# Patient Record
Sex: Female | Born: 1990 | Race: White | Hispanic: No | Marital: Single | Smoking: Former smoker
Health system: Southern US, Community
[De-identification: ages and names within clinical notes are randomized; demographics above are authoritative.]

## PROBLEM LIST (undated history)

## (undated) DIAGNOSIS — I10 Essential (primary) hypertension: Secondary | ICD-10-CM

## (undated) DIAGNOSIS — F329 Major depressive disorder, single episode, unspecified: Secondary | ICD-10-CM

## (undated) DIAGNOSIS — F419 Anxiety disorder, unspecified: Secondary | ICD-10-CM

## (undated) DIAGNOSIS — F909 Attention-deficit hyperactivity disorder, unspecified type: Secondary | ICD-10-CM

## (undated) HISTORY — PX: WISDOM TOOTH EXTRACTION: SHX21

## (undated) HISTORY — PX: TONSILLECTOMY: SUR1361

---

## 2008-12-12 ENCOUNTER — Emergency Department: Payer: Self-pay | Admitting: Emergency Medicine

## 2009-06-05 ENCOUNTER — Observation Stay: Payer: Self-pay | Admitting: Psychiatry

## 2012-09-08 ENCOUNTER — Ambulatory Visit: Payer: Self-pay | Admitting: Family Medicine

## 2012-09-13 ENCOUNTER — Emergency Department: Payer: Self-pay | Admitting: Emergency Medicine

## 2014-03-16 ENCOUNTER — Emergency Department: Payer: Self-pay | Admitting: Emergency Medicine

## 2014-03-16 LAB — URINALYSIS, COMPLETE
Bacteria: NONE SEEN
Bilirubin,UR: NEGATIVE
Blood: NEGATIVE
Glucose,UR: NEGATIVE mg/dL (ref 0–75)
KETONE: NEGATIVE
Leukocyte Esterase: NEGATIVE
Nitrite: NEGATIVE
PH: 7 (ref 4.5–8.0)
PROTEIN: NEGATIVE
RBC,UR: <1 /HPF (ref 0–5)
Specific Gravity: 1.011 (ref 1.003–1.030)
Squamous Epithelial: <1
WBC UR: <1 /HPF (ref 0–5)

## 2014-08-23 ENCOUNTER — Ambulatory Visit
Admission: EM | Admit: 2014-08-23 | Discharge: 2014-08-23 | Disposition: A | Discharge status: Home / Self Care | Payer: Self-pay | Attending: Family Medicine | Admitting: Family Medicine

## 2014-08-23 ENCOUNTER — Encounter: Payer: Self-pay | Admitting: Emergency Medicine

## 2014-08-23 DIAGNOSIS — F909 Attention-deficit hyperactivity disorder, unspecified type: Secondary | ICD-10-CM | POA: Insufficient documentation

## 2014-08-23 DIAGNOSIS — F17210 Nicotine dependence, cigarettes, uncomplicated: Secondary | ICD-10-CM | POA: Insufficient documentation

## 2014-08-23 DIAGNOSIS — I10 Essential (primary) hypertension: Secondary | ICD-10-CM | POA: Insufficient documentation

## 2014-08-23 DIAGNOSIS — Z79899 Other long term (current) drug therapy: Secondary | ICD-10-CM | POA: Insufficient documentation

## 2014-08-23 HISTORY — DX: Essential (primary) hypertension: I10

## 2014-08-23 HISTORY — DX: Attention-deficit hyperactivity disorder, unspecified type: F90.9

## 2014-08-23 LAB — BASIC METABOLIC PANEL
Anion gap: 12 (ref 5–15)
BUN: 9 mg/dL (ref 6–20)
CALCIUM: 9.7 mg/dL (ref 8.9–10.3)
CO2: 24 mmol/L (ref 22–32)
CREATININE: 0.77 mg/dL (ref 0.44–1.00)
Chloride: 103 mmol/L (ref 101–111)
GFR calc non Af Amer: >60 mL/min (ref >60)
Glucose, Bld: 89 mg/dL (ref 65–99)
Potassium: 3.8 mmol/L (ref 3.5–5.1)
Sodium: 139 mmol/L (ref 135–145)

## 2014-08-23 LAB — PREGNANCY, URINE: Preg Test, Ur: NEGATIVE

## 2014-08-23 MED ORDER — LISINOPRIL-HYDROCHLOROTHIAZIDE 20-25 MG PO TABS: 1.0000 | ORAL_TABLET | Freq: Every day | ORAL | Status: DC

## 2014-08-23 MED ORDER — AMLODIPINE BESYLATE 10 MG PO TABS: 10.0000 mg | ORAL_TABLET | Freq: Every day | ORAL | Status: DC

## 2014-08-23 NOTE — ED Provider Notes (Signed)
CSN: [id number]     Arrival date & time 08/23/14  9147 History   First MD Initiated Contact with Patient 08/23/14 1035     Chief Complaint  Patient presents with  . Joint Swelling   (Consider location/radiation/quality/duration/timing/severity/associated sxs/prior Treatment) HPI Comments: Caucasian female recently moved and has not found new PCM ran out of zestoretic 20-25mg  po daily four days ago has had bilateral ankle swelling.  Still taking norvasc  po daily.  PCM wouldn't call in refill stated she needed to come in for a visit/labs.  Last labs 8 months ago.  Patient's mother reported grandmother required potassium and her blood pressure pills.  Mother with patient in exam room.  The history is provided by the patient.    Past Medical History  Diagnosis Date  . Hypertension   . ADHD (attention deficit hyperactivity disorder)    Past Surgical History  Procedure Laterality Date  . Tonsillectomy    . Wisdom tooth extraction     History reviewed. No pertinent family history. History  Substance Use Topics  . Smoking status: Current Every Day Smoker  . Smokeless tobacco: Never Used  . Alcohol Use: No   OB History    No data available     Review of Systems  Constitutional: Negative for fever, chills, diaphoresis, activity change, appetite change and fatigue.  HENT: Negative for congestion, dental problem, drooling, ear discharge, ear pain and facial swelling.   Eyes: Negative for photophobia, pain, discharge, redness, itching and visual disturbance.  Respiratory: Negative for cough, choking, chest tightness, shortness of breath, wheezing and stridor.   Cardiovascular: Positive for leg swelling. Negative for chest pain and palpitations.  Gastrointestinal: Negative for nausea, vomiting, abdominal pain, diarrhea, constipation, blood in stool and abdominal distention.  Endocrine: Negative for cold intolerance and heat intolerance.  Genitourinary: Negative for dysuria and  difficulty urinating.  Musculoskeletal: Positive for joint swelling. Negative for myalgias, back pain, arthralgias, gait problem, neck pain and neck stiffness.  Skin: Negative for color change, pallor, rash and wound.  Allergic/Immunologic: Negative for environmental allergies and food allergies.  Neurological: Negative for dizziness, tremors, seizures, syncope, facial asymmetry, speech difficulty, weakness, light-headedness, numbness and headaches.  Hematological: Negative for adenopathy. Does not bruise/bleed easily.  Psychiatric/Behavioral: Negative for behavioral problems, confusion, sleep disturbance and agitation.    Allergies  Review of patient's allergies indicates no known allergies.  Home Medications   Prior to Admission medications   Medication Sig Start Date End Date Taking? Authorizing Provider  amLODipine (NORVASC) 10 MG tablet Take 10 mg by mouth daily.   Yes Historical Provider, MD  amphetamine-dextroamphetamine (ADDERALL) 20 MG tablet Take 20 mg by mouth 3 (three) times daily.   Yes Historical Provider, MD  lisinopril-hydrochlorothiazide (PRINZIDE,ZESTORETIC) 20-25 MG per tablet Take 1 tablet by mouth daily.   Yes Historical Provider, MD  amLODipine (NORVASC) 10 MG tablet Take 1 tablet (10 mg total) by mouth daily. 08/23/14   Barbaraann Barthel, NP  lisinopril-hydrochlorothiazide (ZESTORETIC) 20-25 MG per tablet Take 1 tablet by mouth daily. 08/23/14   Jarold Song Ivar Domangue, NP   BP 137/105 mmHg  Pulse 102  Temp(Src) 98.5 F (36.9 C) (Oral)  Resp 16  Ht  (1.727 m)  Wt 165 lb (74.844 kg)  BMI 25.09 kg/m2  SpO2 100%  LMP 08/02/2014 Physical Exam  Constitutional: She is oriented to person, place, and time. Vital signs are normal. She appears well-developed and well-nourished. No distress.  HENT:  Head: Normocephalic and atraumatic.  Right Ear:  External ear normal.  Left Ear: External ear normal.  Nose: Nose normal.  Mouth/Throat: Oropharynx is clear and moist. No  oropharyngeal exudate.  Eyes: Conjunctivae, EOM and lids are normal. Pupils are equal, round, and reactive to light. Right eye exhibits no discharge. Left eye exhibits no discharge. No scleral icterus.  Neck: Trachea normal and normal range of motion. Neck supple. No JVD present. No tracheal deviation present. No thyromegaly present.  Cardiovascular: Normal rate, regular rhythm, normal heart sounds and intact distal pulses.  Exam reveals no gallop and no friction rub.   No murmur heard. Pulses:      Dorsalis pedis pulses are 2+ on the right side, and 2+ on the left side.  Bilateral lower leg/ankle swelling nonpitting 1+/4  Pulmonary/Chest: Effort normal and breath sounds normal. No stridor. No respiratory distress. She has no wheezes. She has no rales. She exhibits no tenderness.  Abdominal: Soft. Bowel sounds are normal. She exhibits no distension. There is no tenderness.  Musculoskeletal: Normal range of motion. She exhibits edema. She exhibits no tenderness.  Lymphadenopathy:    She has no cervical adenopathy.  Neurological: She is alert and oriented to person, place, and time. Coordination normal.  Skin: Skin is warm, dry and intact. No rash noted. She is not diaphoretic. No erythema. No pallor.  Psychiatric: She has a normal mood and affect. Her speech is normal and behavior is normal. Judgment and thought content normal. Cognition and memory are normal.  Nursing note and vitals reviewed.   ED Course  Procedures (including critical care time) Labs Review Labs Reviewed  BASIC METABOLIC PANEL    Imaging Review No results found. 1200 patient notified via telephone hcg negative and bmp normal potassium/sodium and kidney function.  Patient to start taking medication as prescribed.  Patient verbalized understanding of information/instructions and had no further questions at this time.  MDM   1. Essential hypertension    BMP and HCG today.  Gave 30 day refill of her medications.  Find  new PCM and schedule appt for re-evaluation with taking both of her medications consistently.  Continue current medications as directed.  Continue to monitor blood pressure at home and maintain log of blood pressure and pulse to bring to follow up appointments.  Continue low sodium diet and exercise program.  Recommended weight loss/weight maintenance to BMI 20-25.  Return to the clinic if any new symptoms.  Patient verbalized agreement and understanding of treatment plan and had no further questions at this time.   P2:  Diet and Exercise specific for HTN    Barbaraann Barthel, NP 08/23/14 1202

## 2014-08-23 NOTE — ED Notes (Signed)
Left ankle/foot swelling X 3 days. Reports she has been out of her lisinopril/hctz for 4 days. Has been on this medication for hypertension for 2-3 years.

## 2014-08-23 NOTE — ED Notes (Signed)
Unable to print and hand patient her discharge instructions because her ride is late for a doctors appointment.

## 2014-08-23 NOTE — Discharge Instructions (Signed)
DASH Eating Plan DASH stands for "Dietary Approaches to Stop Hypertension." The DASH eating plan is a healthy eating plan that has been shown to reduce high blood pressure (hypertension). Additional health benefits may include reducing the risk of type 2 diabetes mellitus, heart disease, and stroke. The DASH eating plan may also help with weight loss. WHAT DO I NEED TO KNOW ABOUT THE DASH EATING PLAN? For the DASH eating plan, you will follow these general guidelines:  Choose foods with a percent daily value for sodium of less than 5% (as listed on the food label).  Use salt-free seasonings or herbs instead of table salt or sea salt.  Check with your health care provider or pharmacist before using salt substitutes.  Eat lower-sodium products, often labeled as "lower sodium" or "no salt added."  Eat fresh foods.  Eat more vegetables, fruits, and low-fat dairy products.  Choose whole grains. Look for the word "whole" as the first word in the ingredient list.  Choose fish and skinless chicken or turkey more often than red meat. Limit fish, poultry, and meat to 6 oz (170 g) each day.  Limit sweets, desserts, sugars, and sugary drinks.  Choose heart-healthy fats.  Limit cheese to 1 oz (28 g) per day.  Eat more home-cooked food and less restaurant, buffet, and fast food.  Limit fried foods.  Cook foods using methods other than frying.  Limit canned vegetables. If you do use them, rinse them well to decrease the sodium.  When eating at a restaurant, ask that your food be prepared with less salt, or no salt if possible. WHAT FOODS CAN I EAT? Seek help from a dietitian for individual calorie needs. Grains Whole grain or whole wheat bread. Brown rice. Whole grain or whole wheat pasta. Quinoa, bulgur, and whole grain cereals. Low-sodium cereals. Corn or whole wheat flour tortillas. Whole grain cornbread. Whole grain crackers. Low-sodium crackers. Vegetables Fresh or frozen vegetables  (raw, steamed, roasted, or grilled). Low-sodium or reduced-sodium tomato and vegetable juices. Low-sodium or reduced-sodium tomato sauce and paste. Low-sodium or reduced-sodium canned vegetables.  Fruits All fresh, canned (in natural juice), or frozen fruits. Meat and Other Protein Products Ground beef (85% or leaner), grass-fed beef, or beef trimmed of fat. Skinless chicken or turkey. Ground chicken or turkey. Pork trimmed of fat. All fish and seafood. Eggs. Dried beans, peas, or lentils. Unsalted nuts and seeds. Unsalted canned beans. Dairy Low-fat dairy products, such as skim or 1% milk, 2% or reduced-fat cheeses, low-fat ricotta or cottage cheese, or plain low-fat yogurt. Low-sodium or reduced-sodium cheeses. Fats and Oils Tub margarines without trans fats. Light or reduced-fat mayonnaise and salad dressings (reduced sodium). Avocado. Safflower, olive, or canola oils. Natural peanut or almond butter. Other Unsalted popcorn and pretzels. The items listed above may not be a complete list of recommended foods or beverages. Contact your dietitian for more options. WHAT FOODS ARE NOT RECOMMENDED? Grains White bread. White pasta. White rice. Refined cornbread. Bagels and croissants. Crackers that contain trans fat. Vegetables Creamed or fried vegetables. Vegetables in a cheese sauce. Regular canned vegetables. Regular canned tomato sauce and paste. Regular tomato and vegetable juices. Fruits Dried fruits. Canned fruit in light or heavy syrup. Fruit juice. Meat and Other Protein Products Fatty cuts of meat. Ribs, chicken wings, bacon, sausage, bologna, salami, chitterlings, fatback, hot dogs, bratwurst, and packaged luncheon meats. Salted nuts and seeds. Canned beans with salt. Dairy Whole or 2% milk, cream, half-and-half, and cream cheese. Whole-fat or sweetened yogurt. Full-fat   cheeses or blue cheese. Nondairy creamers and whipped toppings. Processed cheese, cheese spreads, or cheese  curds. Condiments Onion and garlic salt, seasoned salt, table salt, and sea salt. Canned and packaged gravies. Worcestershire sauce. Tartar sauce. Barbecue sauce. Teriyaki sauce. Soy sauce, including reduced sodium. Steak sauce. Fish sauce. Oyster sauce. Cocktail sauce. Horseradish. Ketchup and mustard. Meat flavorings and tenderizers. Bouillon cubes. Hot sauce. Tabasco sauce. Marinades. Taco seasonings. Relishes. Fats and Oils Butter, stick margarine, lard, shortening, ghee, and bacon fat. Coconut, palm kernel, or palm oils. Regular salad dressings. Other Pickles and olives. Salted popcorn and pretzels. The items listed above may not be a complete list of foods and beverages to avoid. Contact your dietitian for more information. WHERE CAN I FIND MORE INFORMATION? National Heart, Lung, and [employer] Institute: www.nhlbi.nih.gov/health/health-topics/topics/dash/ Document Released: 02/13/2011 Document Revised: 07/11/2013 Document Reviewed: 12/29/2012 ExitCare Patient Information 2015 [employer]. This information is not intended to replace advice given to you by your health care provider. Make sure you discuss any questions you have with your health care provider. Hypertension Hypertension, commonly called high blood pressure, is when the force of blood pumping through your arteries is too strong. Your arteries are the blood vessels that carry blood from your heart throughout your body. A blood pressure reading consists of a higher number over a lower number, such as 110/72. The higher number (systolic) is the pressure inside your arteries when your heart pumps. The lower number (diastolic) is the pressure inside your arteries when your heart relaxes. Ideally you want your blood pressure below 120/80. Hypertension forces your heart to work harder to pump blood. Your arteries may become narrow or stiff. Having hypertension puts you at risk for heart disease, stroke, and other problems.  RISK  FACTORS Some risk factors for high blood pressure are controllable. Others are not.  Risk factors you cannot control include:   Race. You may be at higher risk if you are African American.  Age. Risk increases with age.  Gender. Men are at higher risk than women before age 45 years. After age 65, women are at higher risk than men. Risk factors you can control include:  Not getting enough exercise or physical activity.  Being overweight.  Getting too much fat, sugar, calories, or salt in your diet.  Drinking too much alcohol. SIGNS AND SYMPTOMS Hypertension does not usually cause signs or symptoms. Extremely high blood pressure (hypertensive crisis) may cause headache, anxiety, shortness of breath, and nosebleed. DIAGNOSIS  To check if you have hypertension, your health care provider will measure your blood pressure while you are seated, with your arm held at the level of your heart. It should be measured at least twice using the same arm. Certain conditions can cause a difference in blood pressure between your right and left arms. A blood pressure reading that is higher than normal on one occasion does not mean that you need treatment. If one blood pressure reading is high, ask your health care provider about having it checked again. TREATMENT  Treating high blood pressure includes making lifestyle changes and possibly taking medicine. Living a healthy lifestyle can help lower high blood pressure. You may need to change some of your habits. Lifestyle changes may include:  Following the DASH diet. This diet is high in fruits, vegetables, and whole grains. It is low in salt, red meat, and added sugars.  Getting at least 2 hours of brisk physical activity every week.  Losing weight if necessary.  Not smoking.  Limiting   alcoholic beverages.  Learning ways to reduce stress. If lifestyle changes are not enough to get your blood pressure under control, your health care provider may  prescribe medicine. You may need to take more than one. Work closely with your health care provider to understand the risks and benefits. HOME CARE INSTRUCTIONS  Have your blood pressure rechecked as directed by your health care provider.   Take medicines only as directed by your health care provider. Follow the directions carefully. Blood pressure medicines must be taken as prescribed. The medicine does not work as well when you skip doses. Skipping doses also puts you at risk for problems.   Do not smoke.   Monitor your blood pressure at home as directed by your health care provider. SEEK MEDICAL CARE IF:   You think you are having a reaction to medicines taken.  You have recurrent headaches or feel dizzy.  You have swelling in your ankles.  You have trouble with your vision. SEEK IMMEDIATE MEDICAL CARE IF:  You develop a severe headache or confusion.  You have unusual weakness, numbness, or feel faint.  You have severe chest or abdominal pain.  You vomit repeatedly.  You have trouble breathing. MAKE SURE YOU:   Understand these instructions.  Will watch your condition.  Will get help right away if you are not doing well or get worse. Document Released: 02/24/2005 Document Revised: 07/11/2013 Document Reviewed: 12/17/2012 ExitCare Patient Information 2015 [employer]. This information is not intended to replace advice given to you by your health care provider. Make sure you discuss any questions you have with your health care provider.  

## 2015-06-08 ENCOUNTER — Emergency Department
Admission: EM | Admit: 2015-06-08 | Discharge: 2015-06-08 | Disposition: A | Discharge status: Home / Self Care | Payer: Self-pay | Attending: Emergency Medicine | Admitting: Emergency Medicine

## 2015-06-08 ENCOUNTER — Encounter: Payer: Self-pay | Admitting: Emergency Medicine

## 2015-06-08 ENCOUNTER — Emergency Department: Payer: Self-pay

## 2015-06-08 DIAGNOSIS — F17200 Nicotine dependence, unspecified, uncomplicated: Secondary | ICD-10-CM | POA: Insufficient documentation

## 2015-06-08 DIAGNOSIS — Z79899 Other long term (current) drug therapy: Secondary | ICD-10-CM | POA: Insufficient documentation

## 2015-06-08 DIAGNOSIS — J189 Pneumonia, unspecified organism: Secondary | ICD-10-CM | POA: Insufficient documentation

## 2015-06-08 DIAGNOSIS — I10 Essential (primary) hypertension: Secondary | ICD-10-CM | POA: Insufficient documentation

## 2015-06-08 DIAGNOSIS — F909 Attention-deficit hyperactivity disorder, unspecified type: Secondary | ICD-10-CM | POA: Insufficient documentation

## 2015-06-08 LAB — BASIC METABOLIC PANEL
ANION GAP: 9 (ref 5–15)
BUN: 8 mg/dL (ref 6–20)
CALCIUM: 9.2 mg/dL (ref 8.9–10.3)
CO2: 25 mmol/L (ref 22–32)
CREATININE: 0.67 mg/dL (ref 0.44–1.00)
Chloride: 98 mmol/L — ABNORMAL LOW (ref 101–111)
Glucose, Bld: 103 mg/dL — ABNORMAL HIGH (ref 65–99)
Potassium: 3.5 mmol/L (ref 3.5–5.1)
SODIUM: 132 mmol/L — AB (ref 135–145)

## 2015-06-08 LAB — TROPONIN I: Troponin I: <0.03 ng/mL (ref <0.031)

## 2015-06-08 LAB — CBC
HCT: 41.9 % (ref 35.0–47.0)
Hemoglobin: 14.3 g/dL (ref 12.0–16.0)
MCH: 35.2 pg — ABNORMAL HIGH (ref 26.0–34.0)
MCHC: 34.1 g/dL (ref 32.0–36.0)
MCV: 103 fL — AB (ref 80.0–100.0)
PLATELETS: 116 10*3/uL — AB (ref 150–440)
RBC: 4.07 MIL/uL (ref 3.80–5.20)
RDW: 14.1 % (ref 11.5–14.5)
WBC: 13.8 10*3/uL — ABNORMAL HIGH (ref 3.6–11.0)

## 2015-06-08 LAB — RAPID INFLUENZA A&B ANTIGENS
Influenza A (ARMC): NEGATIVE
Influenza B (ARMC): NEGATIVE

## 2015-06-08 MED ORDER — IOPAMIDOL (ISOVUE-370) INJECTION 76%
75.0000 mL | Freq: Once | INTRAVENOUS | Status: AC | PRN
  Administered 2015-06-08: 75 mL via INTRAVENOUS
  Filled 2015-06-08: qty 75

## 2015-06-08 MED ORDER — SODIUM CHLORIDE 0.9 % IV SOLN
1000.0000 mL | Freq: Once | INTRAVENOUS | Status: AC
  Administered 2015-06-08: 1000 mL via INTRAVENOUS

## 2015-06-08 MED ORDER — LEVOFLOXACIN IN D5W 750 MG/150ML IV SOLN
750.0000 mg | Freq: Once | INTRAVENOUS | Status: AC
  Administered 2015-06-08: 750 mg via INTRAVENOUS
  Filled 2015-06-08 (×2): qty 150

## 2015-06-08 MED ORDER — LEVOFLOXACIN 750 MG PO TABS: 750.0000 mg | ORAL_TABLET | Freq: Every day | ORAL | Status: AC

## 2015-06-08 NOTE — Discharge Instructions (Signed)

## 2015-06-08 NOTE — ED Notes (Signed)
Pt c/o chest pain radiating to back for 2 weeks.  States "i think i have a lung infection"  Decreased appetite over last 2 weeks.  Subjective fevers per pt.  Has had diarrhea and nausea.  Yellowish brown productive cough per pt.

## 2015-06-08 NOTE — ED Notes (Signed)
POCT PREG NEGATIVE. °

## 2015-06-08 NOTE — ED Provider Notes (Signed)
[facility] Emergency Department Provider Note  ____________________________________________    I have reviewed the triage vital signs and the nursing notes.   HISTORY  Chief Complaint Chest Pain    HPI Marie Mckenzie is a 25 y.o. female who presents with complaints of chest discomfort in her left posterior chest for approximately one week. 2 weeks ago she developed a cough which has not improved. She does report mild shortness of breath. Subjective fevers. No nasal congestion. No recent travel. No calf pain or swelling. No history of DVTs. No hormones.     Past Medical History  Diagnosis Date  . Hypertension   . ADHD (attention deficit hyperactivity disorder)     There are no active problems to display for this patient.   Past Surgical History  Procedure Laterality Date  . Tonsillectomy    . Wisdom tooth extraction      Current Outpatient Rx  Name  Route  Sig  Dispense  Refill  . amLODipine (NORVASC) 10 MG tablet   Oral   Take 10 mg by mouth daily.         Marland Kitchen. amLODipine (NORVASC) 10 MG tablet   Oral   Take 1 tablet (10 mg total) by mouth daily.   30 tablet   0   . amphetamine-dextroamphetamine (ADDERALL) 20 MG tablet   Oral   Take 20 mg by mouth 3 (three) times daily.         Marland Kitchen. lisinopril-hydrochlorothiazide (PRINZIDE,ZESTORETIC) 20-25 MG per tablet   Oral   Take 1 tablet by mouth daily.         Marland Kitchen. lisinopril-hydrochlorothiazide (ZESTORETIC) 20-25 MG per tablet   Oral   Take 1 tablet by mouth daily.   30 tablet   0     Allergies Review of patient's allergies indicates no known allergies.  History reviewed. No pertinent family history.  Social History Social History  Substance Use Topics  . Smoking status: Current Every Day Smoker  . Smokeless tobacco: Never Used  . Alcohol Use: Yes     Comment: rare    Review of Systems  Constitutional: Negative for fever. Eyes: Negative for redness ENT: Negative for  sore throat Cardiovascular: Chest pain as above Respiratory: Mild shortness of breath, cough Gastrointestinal: Negative for abdominal pain Genitourinary: Negative for dysuria. Musculoskeletal: Negative for calf pain Skin: Negative for rash. Neurological: Negative for focal weakness Psychiatric: no anxiety    ____________________________________________   PHYSICAL EXAM:  VITAL SIGNS: ED Triage Vitals  Enc Vitals Group     BP 06/08/15 1322 153/93 mmHg     Pulse Rate 06/08/15 1322 125     Resp 06/08/15 1322 18     Temp 06/08/15 1322 98.6 F (37 C)     Temp Source 06/08/15 1322 Oral     SpO2 06/08/15 1322 95 %     Weight 06/08/15 1322 169 lb (76.658 kg)     Height 06/08/15 1322 5\' 7"  (1.702 m)     Head Cir --      Peak Flow --      Pain Score 06/08/15 1319 8     Pain Loc --      Pain Edu? --      Excl. in GC? --     Constitutional: Alert and oriented. Well appearing and in no distress.  Eyes: Conjunctivae are normal. No erythema or injection ENT   Head: Normocephalic and atraumatic.   Mouth/Throat: Mucous membranes are moist. Cardiovascular: Significant tachycardia, regular  rhythm. Normal and symmetric distal pulses are present in the upper extremities. No murmurs or rubs  Respiratory: Mild tachypnea. Breath sounds are clear and equal bilaterally.  Gastrointestinal: Soft and non-tender in all quadrants. No distention. There is no CVA tenderness. Genitourinary: deferred Musculoskeletal: Nontender with normal range of motion in all extremities. No lower extremity tenderness nor edema. Neurologic:  Normal speech and language. No gross focal neurologic deficits are appreciated. Skin:  Skin is warm, dry and intact. No rash noted. Psychiatric: Mood and affect are normal. Patient exhibits appropriate insight and judgment.  ____________________________________________    LABS (pertinent positives/negatives)  Labs Reviewed  BASIC METABOLIC PANEL - Abnormal; Notable  for the following:    Sodium 132 (*)    Chloride 98 (*)    Glucose, Bld 103 (*)    All other components within normal limits  CBC - Abnormal; Notable for the following:    WBC 13.8 (*)    MCV 103.0 (*)    MCH 35.2 (*)    Platelets 116 (*)    All other components within normal limits  RAPID INFLUENZA A&B ANTIGENS ([facility] ONLY)  TROPONIN I    ____________________________________________   EKG  ED ECG REPORT I, [name], the attending physician, personally viewed and interpreted this ECG.  Date: 06/08/2015 EKG Time: 1:30 PM Rate: 121 Rhythm: Sinus tachycardia QRS Axis: normal Intervals: normal ST/T Wave abnormalities: normal Conduction Disturbances: none    ____________________________________________    RADIOLOGY  Chest x-ray unremarkable CT angiography shows pneumonia, no PE ____________________________________________   PROCEDURES  Procedure(s) performed: none  Critical Care performed: none  ____________________________________________   INITIAL IMPRESSION / ASSESSMENT AND PLAN / ED COURSE  Pertinent labs & imaging results that were available during my care of the patient were reviewed by me and considered in my medical decision making (see chart for details).  Patient presents with mild dyspnea, cough, left chest pleurisy and tachycardia and a normal chest x-ray. She has no obvious risk factors for PE but given the above we will obtain CT angiography of her chest to rule out PE  CT shows left lower lobe pneumonia. We will give IV Levaquin in the emergency department. I offered admission to the patient that she would like to try by mouth antibiotics at home. Her heart rate has improved after fluids to 111. She knows to return if any worsening symptoms including shortness of breath, worsening cough, fever or chills etc.  ____________________________________________   FINAL CLINICAL IMPRESSION(S) / ED DIAGNOSES  Final diagnoses:  Community  acquired pneumonia          [name], MD 06/08/15 2241

## 2016-02-10 ENCOUNTER — Inpatient Hospital Stay
Admission: AD | Admit: 2016-02-10 | Discharge: 2016-02-14 | DRG: 885 | Disposition: A | Discharge status: Home / Self Care | Payer: No Typology Code available for payment source | Source: Intra-hospital | Attending: Psychiatry | Admitting: Psychiatry

## 2016-02-10 ENCOUNTER — Emergency Department
Admission: EM | Admit: 2016-02-10 | Discharge: 2016-02-10 | Disposition: A | Discharge status: Other Acute Inpatient Hospital | Payer: Self-pay | Attending: Emergency Medicine | Admitting: Emergency Medicine

## 2016-02-10 ENCOUNTER — Encounter: Payer: Self-pay | Admitting: Emergency Medicine

## 2016-02-10 DIAGNOSIS — F1020 Alcohol dependence, uncomplicated: Secondary | ICD-10-CM | POA: Diagnosis present

## 2016-02-10 DIAGNOSIS — R45851 Suicidal ideations: Secondary | ICD-10-CM

## 2016-02-10 DIAGNOSIS — F909 Attention-deficit hyperactivity disorder, unspecified type: Secondary | ICD-10-CM | POA: Diagnosis present

## 2016-02-10 DIAGNOSIS — F17210 Nicotine dependence, cigarettes, uncomplicated: Secondary | ICD-10-CM | POA: Diagnosis present

## 2016-02-10 DIAGNOSIS — Z811 Family history of alcohol abuse and dependence: Secondary | ICD-10-CM | POA: Diagnosis not present

## 2016-02-10 DIAGNOSIS — N390 Urinary tract infection, site not specified: Secondary | ICD-10-CM | POA: Diagnosis present

## 2016-02-10 DIAGNOSIS — F17200 Nicotine dependence, unspecified, uncomplicated: Secondary | ICD-10-CM | POA: Diagnosis present

## 2016-02-10 DIAGNOSIS — F918 Other conduct disorders: Secondary | ICD-10-CM | POA: Insufficient documentation

## 2016-02-10 DIAGNOSIS — Z590 Homelessness: Secondary | ICD-10-CM | POA: Diagnosis not present

## 2016-02-10 DIAGNOSIS — F332 Major depressive disorder, recurrent severe without psychotic features: Secondary | ICD-10-CM | POA: Diagnosis present

## 2016-02-10 DIAGNOSIS — I10 Essential (primary) hypertension: Secondary | ICD-10-CM | POA: Insufficient documentation

## 2016-02-10 DIAGNOSIS — R4689 Other symptoms and signs involving appearance and behavior: Secondary | ICD-10-CM

## 2016-02-10 DIAGNOSIS — M549 Dorsalgia, unspecified: Secondary | ICD-10-CM | POA: Diagnosis present

## 2016-02-10 DIAGNOSIS — Z046 Encounter for general psychiatric examination, requested by authority: Secondary | ICD-10-CM

## 2016-02-10 DIAGNOSIS — Z79899 Other long term (current) drug therapy: Secondary | ICD-10-CM | POA: Diagnosis not present

## 2016-02-10 DIAGNOSIS — F10129 Alcohol abuse with intoxication, unspecified: Secondary | ICD-10-CM | POA: Insufficient documentation

## 2016-02-10 DIAGNOSIS — F39 Unspecified mood [affective] disorder: Secondary | ICD-10-CM | POA: Diagnosis present

## 2016-02-10 DIAGNOSIS — G4700 Insomnia, unspecified: Secondary | ICD-10-CM | POA: Diagnosis present

## 2016-02-10 DIAGNOSIS — F10929 Alcohol use, unspecified with intoxication, unspecified: Secondary | ICD-10-CM | POA: Diagnosis present

## 2016-02-10 DIAGNOSIS — F1010 Alcohol abuse, uncomplicated: Secondary | ICD-10-CM

## 2016-02-10 LAB — URINE DRUG SCREEN, QUALITATIVE (ARMC ONLY)
Amphetamines, Ur Screen: POSITIVE — AB
BARBITURATES, UR SCREEN: NOT DETECTED
Benzodiazepine, Ur Scrn: NOT DETECTED
CANNABINOID 50 NG, UR ~~LOC~~: NOT DETECTED
Cocaine Metabolite,Ur ~~LOC~~: NOT DETECTED
MDMA (Ecstasy)Ur Screen: NOT DETECTED
Methadone Scn, Ur: NOT DETECTED
Opiate, Ur Screen: NOT DETECTED
Phencyclidine (PCP) Ur S: NOT DETECTED
TRICYCLIC, UR SCREEN: NOT DETECTED

## 2016-02-10 LAB — ACETAMINOPHEN LEVEL: Acetaminophen (Tylenol), Serum: <10 ug/mL — ABNORMAL LOW (ref 10–30)

## 2016-02-10 LAB — COMPREHENSIVE METABOLIC PANEL
ALK PHOS: 56 U/L (ref 38–126)
ALT: 65 U/L — ABNORMAL HIGH (ref 14–54)
AST: 132 U/L — AB (ref 15–41)
Albumin: 4.7 g/dL (ref 3.5–5.0)
Anion gap: 9 (ref 5–15)
BILIRUBIN TOTAL: 0.4 mg/dL (ref 0.3–1.2)
BUN: 6 mg/dL (ref 6–20)
CALCIUM: 9.3 mg/dL (ref 8.9–10.3)
CHLORIDE: 107 mmol/L (ref 101–111)
CO2: 26 mmol/L (ref 22–32)
Creatinine, Ser: 0.68 mg/dL (ref 0.44–1.00)
GFR calc non Af Amer: >60 mL/min (ref >60)
Glucose, Bld: 120 mg/dL — ABNORMAL HIGH (ref 65–99)
Potassium: 3.3 mmol/L — ABNORMAL LOW (ref 3.5–5.1)
Sodium: 142 mmol/L (ref 135–145)
TOTAL PROTEIN: 7.9 g/dL (ref 6.5–8.1)

## 2016-02-10 LAB — CBC
HCT: 41.7 % (ref 35.0–47.0)
Hemoglobin: 14.7 g/dL (ref 12.0–16.0)
MCH: 37.8 pg — AB (ref 26.0–34.0)
MCHC: 35.2 g/dL (ref 32.0–36.0)
MCV: 107.4 fL — AB (ref 80.0–100.0)
PLATELETS: 182 10*3/uL (ref 150–440)
RBC: 3.88 MIL/uL (ref 3.80–5.20)
RDW: 13.4 % (ref 11.5–14.5)
WBC: 7 10*3/uL (ref 3.6–11.0)

## 2016-02-10 LAB — POCT PREGNANCY, URINE: PREG TEST UR: NEGATIVE

## 2016-02-10 LAB — SALICYLATE LEVEL

## 2016-02-10 LAB — ETHANOL: ALCOHOL ETHYL (B): 393 mg/dL — AB (ref <5)

## 2016-02-10 MED ORDER — LORAZEPAM 2 MG/ML IJ SOLN
INTRAMUSCULAR | Status: AC
  Administered 2016-02-10: 2 mg via INTRAMUSCULAR
  Filled 2016-02-10: qty 1

## 2016-02-10 MED ORDER — CHLORDIAZEPOXIDE HCL 25 MG PO CAPS
50.0000 mg | ORAL_CAPSULE | Freq: Three times a day (TID) | ORAL | Status: DC
  Administered 2016-02-10 (×2): 50 mg via ORAL
  Filled 2016-02-10 (×2): qty 2

## 2016-02-10 MED ORDER — LORAZEPAM 2 MG/ML IJ SOLN
2.0000 mg | Freq: Once | INTRAMUSCULAR | Status: AC
  Administered 2016-02-10: 2 mg via INTRAMUSCULAR

## 2016-02-10 MED ORDER — DIPHENHYDRAMINE HCL 50 MG/ML IJ SOLN
50.0000 mg | Freq: Once | INTRAMUSCULAR | Status: AC
  Administered 2016-02-10: 50 mg via INTRAMUSCULAR

## 2016-02-10 MED ORDER — NICOTINE 21 MG/24HR TD PT24
21.0000 mg | MEDICATED_PATCH | Freq: Once | TRANSDERMAL | Status: DC
  Administered 2016-02-10: 21 mg via TRANSDERMAL
  Filled 2016-02-10: qty 1

## 2016-02-10 MED ORDER — HALOPERIDOL LACTATE 5 MG/ML IJ SOLN
INTRAMUSCULAR | Status: AC
  Administered 2016-02-10: 5 mg via INTRAMUSCULAR
  Filled 2016-02-10: qty 1

## 2016-02-10 MED ORDER — HALOPERIDOL LACTATE 5 MG/ML IJ SOLN
5.0000 mg | Freq: Once | INTRAMUSCULAR | Status: AC
  Administered 2016-02-10: 5 mg via INTRAMUSCULAR

## 2016-02-10 MED ORDER — DIPHENHYDRAMINE HCL 50 MG/ML IJ SOLN
INTRAMUSCULAR | Status: AC
  Administered 2016-02-10: 50 mg via INTRAMUSCULAR
  Filled 2016-02-10: qty 1

## 2016-02-10 NOTE — ED Provider Notes (Addendum)
[facility] Emergency Department Provider Note  ____________________________________________   First MD Initiated Contact with Patient 02/10/16 0404     (approximate)  I have reviewed the triage vital signs and the nursing notes.   HISTORY  Chief Complaint Suicidal   HPI Marie Mckenzie is a 25 y.o. female with a history of alcoholism who is presenting to the emergency department today intoxicated and wanting to jump in front of a train. She does not give further history and says "go check your records"  when asked about further details.   Past Medical History:  Diagnosis Date  . ADHD (attention deficit hyperactivity disorder)   . Hypertension     There are no active problems to display for this patient.   Past Surgical History:  Procedure Laterality Date  . TONSILLECTOMY    . WISDOM TOOTH EXTRACTION      Prior to Admission medications   Medication Sig Start Date End Date Taking? Authorizing Provider  amLODipine (NORVASC) 10 MG tablet Take 1 tablet (10 mg total) by mouth daily. 08/23/14  Yes Barbaraann Barthelina A Betancourt, NP  amphetamine-dextroamphetamine (ADDERALL) 20 MG tablet Take 20 mg by mouth 3 (three) times daily.   Yes Historical Provider, MD  lisinopril-hydrochlorothiazide (ZESTORETIC) 20-25 MG per tablet Take 1 tablet by mouth daily. 08/23/14  Yes Barbaraann Barthelina A Betancourt, NP    Allergies Patient has no known allergies.  History reviewed. No pertinent family history.  Social History Social History  Substance Use Topics  . Smoking status: Current Every Day Smoker    Types: Cigarettes  . Smokeless tobacco: Never Used  . Alcohol use Yes     Comment: uknown amount of liquor tonight    Review of Systems Level V caveat secondary to patient unwilling to answer questions.  ____________________________________________   PHYSICAL EXAM:  VITAL SIGNS: ED Triage Vitals  Enc Vitals Group     BP 02/10/16 0330 (!) 135/94     Pulse Rate 02/10/16  0330 (!) 112     Resp 02/10/16 0330 18     Temp 02/10/16 0330 98.1 F (36.7 C)     Temp Source 02/10/16 0330 Oral     SpO2 02/10/16 0330 98 %     Weight 02/10/16 0331 165 lb (74.8 kg)     Height 02/10/16 0331 5\' 6"  (1.676 m)     Head Circumference --      Peak Flow --      Pain Score 02/10/16 0331 0     Pain Loc --      Pain Edu? --      Excl. in GC? --     Constitutional: Alert and oriented. Patient is agitated. Yelling. Tearful. Eyes: Conjunctivae are normal. PERRL. EOMI. Head: Atraumatic. Nose: No congestion/rhinnorhea. Mouth/Throat: Mucous membranes are moist.  Neck: No stridor.   Cardiovascular: Normal rate, regular rhythm. Grossly normal heart sounds.   Respiratory: Normal respiratory effort.  No retractions. Lungs CTAB. Gastrointestinal: Soft and nontender. No distention.  Musculoskeletal: No lower extremity tenderness nor edema.  No joint effusions. Neurologic:  Normal speech and language. No gross focal neurologic deficits are appreciated.  Skin:  Skin is warm, dry and intact. No rash noted. Psychiatric: Agitated. Yelling.  ____________________________________________   LABS (all labs ordered are listed, but only abnormal results are displayed)  Labs Reviewed  COMPREHENSIVE METABOLIC PANEL - Abnormal; Notable for the following:       Result Value   Potassium 3.3 (*)    Glucose, Bld 120 (*)  AST 132 (*)    ALT 65 (*)    All other components within normal limits  ETHANOL - Abnormal; Notable for the following:    Alcohol, Ethyl (B) 393 (*)    All other components within normal limits  ACETAMINOPHEN LEVEL - Abnormal; Notable for the following:    Acetaminophen (Tylenol), Serum <10 (*)    All other components within normal limits  CBC - Abnormal; Notable for the following:    MCV 107.4 (*)    MCH 37.8 (*)    All other components within normal limits  URINE DRUG SCREEN, QUALITATIVE ([facility] ONLY) - Abnormal; Notable for the following:    Amphetamines, Ur Screen  POSITIVE (*)    All other components within normal limits  SALICYLATE LEVEL  POC URINE PREG, ED  POCT PREGNANCY, URINE   ____________________________________________  EKG   ____________________________________________  RADIOLOGY   ____________________________________________   PROCEDURES  Procedure(s) performed:   Procedures  Critical Care performed:   ____________________________________________   INITIAL IMPRESSION / ASSESSMENT AND PLAN / ED COURSE  Pertinent labs & imaging results that were available during my care of the patient were reviewed by me and considered in my medical decision making (see chart for details).  Patient, during the interview, became upset and attempted to walk out of the emergency department. She required restraints by security and then medication. She was given Haldol as well as Benadryl and Ativan. I completed involuntary commitment paperwork.  Clinical Course      ____________________________________________   FINAL CLINICAL IMPRESSION(S) / ED DIAGNOSES  Agitation. Alcohol intoxication. Suicidal ideation.    NEW MEDICATIONS STARTED DURING THIS VISIT:  New Prescriptions   No medications on file     Note:  This document was prepared using Dragon voice recognition software and may include unintentional dictation errors.    Myrna Blazeravid Matthew Schaevitz, MD 02/10/16 [phone]  Patient resting comfortably after sedatives. Pending psychiatric evaluation.    Myrna Blazeravid Matthew Schaevitz, MD 02/10/16 [phone]

## 2016-02-10 NOTE — ED Notes (Signed)
Pt in her room resting with eye closed. Even, unlabored respirations noted. No acute distress noted. Safety maintained with every 15 minute checks and security cameras in place. Will continue to monitor.

## 2016-02-10 NOTE — ED Notes (Signed)
Pt became upset during triage process when she said she wanted to go outside to smoke but was told she was unable to do so; pt trying to leave; called charge nurse for emergency commitment papers on patient who keeps saying she is going to leave and "do it"; pt wanting MD to come out to triage to give her a nicotine patch; pt became angry and tearful when explained that was going to have to wait for a little bit; officer with pt talking with pt and encouraging her calm down

## 2016-02-10 NOTE — ED Provider Notes (Signed)
-----------------------------------------   11:11 PM on 02/10/2016 -----------------------------------------  Patient was accepted to the behavioral medicine unit.   Irean HongJade J Sung, MD 02/10/16 [phone]

## 2016-02-10 NOTE — Progress Notes (Signed)
Pt up to use toilet then to nurse's station complaining of tremors. Denies any other withdrawal symptoms. VS stable. No acute distress noted. Pt cooperative. Will inform Md. Safety maintained. Will continue to monitor.

## 2016-02-10 NOTE — ED Notes (Signed)
Patient yelling loudly in the hallway at staff and ODS. Patient yelling that she wants a room and what kind of hospital is this. Patient stating that she is going to leave. Patient getting out of bed trying to leave. Patient informed no rooms available at ths time and that she is unable to leave at this time because she is IVC. Dr Pershing ProudSchaevitz at patient bedside. Patient yelling at MD. New orders received at this time.
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
## 2016-02-10 NOTE — ED Notes (Signed)
Pt awake, [facility] Pt cooperative at this time. Safety maintained. Will continue to monitor.

## 2016-02-10 NOTE — ED Notes (Signed)

## 2016-02-10 NOTE — ED Notes (Signed)
[facility]. Pt ambulatory and cooperative with transfer. Reports she came to the hospital "because I'm suicidal. I'm an alcoholic. I have been for 7 years. I drink every day." Pt does verbally contract for safety at this time, and reports she will inform staff if she thinks to act on her suicidal thoughts in any way. VS stable. Pt oriented to room/unit. Pt made aware that security cameras are in place. Safety maintained with every 15 minute checks and security cameras in place. Will continue to monitor.

## 2016-02-10 NOTE — ED Notes (Signed)
Pt continues to rest in her room. Eye closed. Even, unlabored respirations. No acute distress noted. Safety maintained with every 15 minute checks and security cameras in place. Will continue to monitor.

## 2016-02-10 NOTE — ED Notes (Signed)
Pt resting at this time during breakfast tray delivery, tray placed on back of pt bed

## 2016-02-10 NOTE — Progress Notes (Signed)
Pt resting in her room with even, unlabored respirations. No acute distress noted. Safety maintained with every 15 minute checks and security cameras in place. Will continue to monitor.

## 2016-02-10 NOTE — ED Notes (Signed)
2 yellow toned piercings removed and placed in container with pt's belongings

## 2016-02-10 NOTE — ED Notes (Signed)
Food/fluids provided to pt. Pt currently sitting up in bed, eating her dinner tray. No complaints at this time. Cooperative. Safety maintained with every 15 minute checks and security cameras in place. Will continue to monitor.

## 2016-02-10 NOTE — ED Notes (Signed)
Dr Pershing ProudSchaevitz notified of ethanol of 393.

## 2016-02-10 NOTE — ED Triage Notes (Addendum)
Pt says she has been suicidal for a month; tonight she was going to walk out in front of a train but a friend stopped her; pt says she is homeless and an alcoholic; pt says "I was going to really do it tonight"; pt says she drinks daily and wakes every day shaking

## 2016-02-10 NOTE — BH Assessment (Signed)
02/10/16 0845. Spoke to ED RN regarding TTS ordered on pt.  Pt BAC quite high, pt asleep at this time.  Will attempt TTS when pt wakes up. Daleen SquibbGreg Shanekia Latella, LCSW

## 2016-02-10 NOTE — ED Notes (Signed)
IVC pt, SOC recommends admit

## 2016-02-10 NOTE — ED Notes (Signed)
Food/fluids provided for lunch. Pt sitting up in bed eating lunch tray. [facility]. No acute distress noted. Safety maintained. Will continue to monitor.

## 2016-02-10 NOTE — ED Notes (Signed)
PT awake in room. Librium administered as ordered. PT cooperative with medication administration. No complaints at this time. No acute distress at this time. Pt watching Tv. Safety maintained with every 15 minute checks and security cameras in place. Will continue to monitor.

## 2016-02-11 DIAGNOSIS — F332 Major depressive disorder, recurrent severe without psychotic features: Principal | ICD-10-CM

## 2016-02-11 DIAGNOSIS — F909 Attention-deficit hyperactivity disorder, unspecified type: Secondary | ICD-10-CM | POA: Diagnosis present

## 2016-02-11 MED ORDER — ACETAMINOPHEN 325 MG PO TABS
650.0000 mg | ORAL_TABLET | Freq: Four times a day (QID) | ORAL | Status: DC | PRN
Start: 2016-02-11 — End: 2016-02-14
  Administered 2016-02-11 – 2016-02-13 (×4): 650 mg via ORAL
  Filled 2016-02-11 (×4): qty 2

## 2016-02-11 MED ORDER — LISINOPRIL-HYDROCHLOROTHIAZIDE 20-25 MG PO TABS: 1.0000 | ORAL_TABLET | Freq: Every day | ORAL | Status: DC

## 2016-02-11 MED ORDER — AMLODIPINE BESYLATE 5 MG PO TABS
10.0000 mg | ORAL_TABLET | Freq: Every day | ORAL | Status: DC
  Administered 2016-02-11: 10 mg via ORAL
  Filled 2016-02-11: qty 2

## 2016-02-11 MED ORDER — NICOTINE 21 MG/24HR TD PT24
21.0000 mg | MEDICATED_PATCH | Freq: Every day | TRANSDERMAL | Status: DC
  Administered 2016-02-11 – 2016-02-14 (×5): 21 mg via TRANSDERMAL
  Filled 2016-02-11 (×5): qty 1

## 2016-02-11 MED ORDER — CHLORDIAZEPOXIDE HCL 25 MG PO CAPS
50.0000 mg | ORAL_CAPSULE | Freq: Four times a day (QID) | ORAL | Status: DC
  Administered 2016-02-11 – 2016-02-12 (×5): 50 mg via ORAL
  Filled 2016-02-11 (×5): qty 2

## 2016-02-11 MED ORDER — ALUM & MAG HYDROXIDE-SIMETH 200-200-20 MG/5ML PO SUSP
30.0000 mL | ORAL | Status: DC | PRN
Start: 2016-02-11 — End: 2016-02-14

## 2016-02-11 MED ORDER — TRAZODONE HCL 100 MG PO TABS
100.0000 mg | ORAL_TABLET | Freq: Every day | ORAL | Status: DC
  Administered 2016-02-11 (×2): 100 mg via ORAL
  Filled 2016-02-11 (×2): qty 1

## 2016-02-11 MED ORDER — CHLORDIAZEPOXIDE HCL 25 MG PO CAPS
50.0000 mg | ORAL_CAPSULE | Freq: Three times a day (TID) | ORAL | Status: DC
  Administered 2016-02-11: 50 mg via ORAL
  Filled 2016-02-11: qty 2

## 2016-02-11 MED ORDER — FOSFOMYCIN TROMETHAMINE 3 G PO PACK
3.0000 g | PACK | Freq: Once | ORAL | Status: AC
  Administered 2016-02-11: 3 g via ORAL
  Filled 2016-02-11: qty 3

## 2016-02-11 MED ORDER — HYDROCHLOROTHIAZIDE 25 MG PO TABS
25.0000 mg | ORAL_TABLET | Freq: Every day | ORAL | Status: DC
  Administered 2016-02-11 – 2016-02-14 (×4): 25 mg via ORAL
  Filled 2016-02-11 (×5): qty 1

## 2016-02-11 MED ORDER — LISINOPRIL 10 MG PO TABS
20.0000 mg | ORAL_TABLET | Freq: Every day | ORAL | Status: DC
  Administered 2016-02-11: 20 mg via ORAL
  Filled 2016-02-11: qty 2

## 2016-02-11 MED ORDER — MAGNESIUM HYDROXIDE 400 MG/5ML PO SUSP: 30.0000 mL | Freq: Every day | ORAL | Status: DC | PRN

## 2016-02-11 MED ORDER — AMPHETAMINE-DEXTROAMPHETAMINE 5 MG PO TABS
20.0000 mg | ORAL_TABLET | Freq: Two times a day (BID) | ORAL | Status: DC
  Administered 2016-02-11 – 2016-02-14 (×6): 20 mg via ORAL
  Filled 2016-02-11 (×6): qty 4

## 2016-02-11 MED ORDER — NICOTINE 21 MG/24HR TD PT24
21.0000 mg | MEDICATED_PATCH | Freq: Once | TRANSDERMAL | Status: AC
  Administered 2016-02-11: 21 mg via TRANSDERMAL
  Filled 2016-02-11: qty 1

## 2016-02-11 NOTE — Progress Notes (Signed)
Patient has been in room asleep most of the day, stating she is really tired. Did attend groups and got up for meals. Denies SI/HI/AVH. States she just wants to get better. Medication compliant. Has been veery pleasant. Remains on Q15 minute checks for safety. Will continue to monitor.

## 2016-02-11 NOTE — Progress Notes (Signed)
Pt c/o burning, and pain during urination at a 6/10. Denies having a vaginal discharge. States "I started to feel it earlier today when I was upstairs. I get UTIs all of the time at home but I would just go to the store and get some AZO, and the pain and burning would go away." Pt reports her most recent UTI was "last month." On-call physician paged around 0030. No response. Was paged twice more around 0050 and 0115. No response. This information will be reported to the oncoming shift for follow-up with the physician. No further complaints. Will continue to monitor.

## 2016-02-11 NOTE — Plan of Care (Signed)
Problem: Education: Goal: Ability to make informed decisions regarding treatment will improve Outcome: Progressing Pt will be able to make informed decisions regarding treatment.  Problem: Medication: Goal: Compliance with prescribed medication regimen will improve Outcome: Progressing Pt will remain compliant with medications.  Problem: Activity: Goal: Interest or engagement in activities will improve Outcome: Progressing Pt will attend groups and appropriately interact with peers.

## 2016-02-11 NOTE — [facility] Group Notes (Signed)
[facility] LCSW Group Therapy   02/11/2016 1pm Type of Therapy: Group Therapy   Participation Level: Active   Participation Quality: Attentive, Sharing and Supportive   Affect: Depressed and Flat   Cognitive: Alert and Oriented   Insight: Developing/Improving and Engaged   Engagement in Therapy: Developing/Improving and Engaged   Modes of Intervention: Clarification, Confrontation, Discussion, Education, Exploration,  Limit-setting, Orientation, Problem-solving, Rapport Building, Dance movement psychotherapisteality Testing, Socialization and Support   Summary of Progress/Problems: Pt identified obstacles faced currently and processed barriers involved in overcoming these obstacles. Pt identified steps necessary for overcoming these obstacles and explored motivation (internal and external) for facing these difficulties head on. Pt further identified one area of concern in their lives and chose a goal to focus on for today. [facility] LCSW Group Therapy   02/11/2016 1pm Type of Therapy: Group Therapy   Participation Level: Active   Participation Quality: Attentive, Sharing and Supportive   Affect: Depressed and Flat   Cognitive: Alert and Oriented   Insight: Developing/Improving and Engaged   Engagement in Therapy: Developing/Improving and Engaged   Modes of Intervention: Clarification, Confrontation, Discussion, Education, Exploration,  Limit-setting, Orientation, Problem-solving, Rapport Building, Dance movement psychotherapisteality Testing, Socialization and Support   Summary of Progress/Problems: Pt identified obstacles faced currently and processed barriers involved in overcoming these obstacles. Pt identified steps necessary for overcoming these obstacles and explored motivation (internal and external) for facing these difficulties head on. Pt further identified one area of concern in their lives and chose a goal to focus on for today. Pt shared that the pt has a primary goal of returning to work and focusing on things to keep the pt busy to  assist the pt in the pt's recovery.  Pt shared that an obstacle to the pt's goal is a combination of alcohol and boredom.. Pt shared the pt's motivation for being successful is to assist the pt is staying purposeful and sober from alcohol so the pt will be better able to "get my life back".  Pt shared the pt intends to attend 12-step meeting to assist the pt in the pt's recovery and in the pt's ability to get her life back and that this looks like "my fiance with my cats and with my work".  CSW actively validated the pt's opinion and provided feedback.  Dorothe PeaJonathan F. Jaequan Propes, LCSWA, LCAS      Dorothe PeaJonathan F. Nezzie Manera, LCSWA, LCAS

## 2016-02-11 NOTE — H&P (Signed)
Psychiatric Admission Assessment Adult  Patient Identification: Marie Mckenzie MRN:  [id number] Date of Evaluation:  02/11/2016 Chief Complaint:  Major depression Principal Diagnosis: <principal problem not specified> Diagnosis:   Patient Active Problem List   Diagnosis Date Noted  . Attention deficit hyperactivity disorder (ADHD) [F90.9] 02/11/2016  . Major depressive disorder, recurrent severe without psychotic features ([place]) [F33.2] 02/10/2016  . Suicidal ideation [R45.851] 02/10/2016  . Involuntary commitment [Z04.6] 02/10/2016  . Alcohol use disorder, severe, dependence ([place]) [F10.20] 02/10/2016  . Alcohol intoxication ([place]) [F10.929] 02/10/2016  . Substance induced mood disorder ([place]) [F19.94] 02/10/2016  . Tobacco use disorder [F17.200] 02/10/2016   History of Present Illness:   Identifying data. Ms. Ronnald Mckenzie is a 25 year old female with history of depression ADHD and alcoholism.  Chief complaint. "I want to stop but I was afraid to do it at home."  History of present illness. Information was obtained from the patient and the chart. The patient has had a history of alcoholism for at least 7 years. She was unable to maintain any sobriety during that time. She would sometimes try to slow down and substitute her half a gallon of rum with beer, but was always afraid that she could develop seizures. Her father is an alcoholic and suffered withdrawal seizures that she witnessed. She reports some symptoms of depression and anxiety with poor sleep, decreased appetite, feeling of guilt and hopelessness worthlessness, poor energy and concentration. She denies suicidal thinking but on the day of admission she arrived in the emergency room threatening to jump in front of a train. She in fact reported that she was on her way to the train tracks and the friend stopped her. She denies psychotic symptoms or symptoms suggestive of bipolar mania. She denies other than alcohol substance use.   Past  psychiatric history. She has been diagnosed with ADHD and sees Dr. Kasandra Knudsen at [facility]. He prescribed 20 mg 3 times a day. She also sees at holistic counselor for her alcoholism. She was never hospitalized. She was never treated with antidepressants. There were no suicide attempts. She did not try substance use treatment. She went to 1 AA meeting but had to leave early.  Family psychiatric history. Father with alcoholism. There are other family members with undiagnosed mental illness.  Social history. 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 She reports living with a friend and his father who are like family to her. They pay for her doctor's visits and medications. They also provide transportation. It is unclear who pays for alcohol.  Total Time spent with patient: 1 hour  Is the patient at risk to self? Yes.    Has the patient been a risk to self in the past 6 months? No.  Has the patient been a risk to self within the distant past? No.  Is the patient a risk to others? No.  Has the patient been a risk to others in the past 6 months? No.  Has the patient been a risk to others within the distant past? No.   Prior Inpatient Therapy:   Prior Outpatient Therapy:    Alcohol Screening: 1. How often do you have a drink containing alcohol?: 4 or more times a week 2. How many drinks containing alcohol do you have on a typical day when you are drinking?: 10 or more 3. How often do you have six or more drinks on one occasion?: Daily or  almost daily Preliminary Score: 8 4. How often during the last year have you found that you were not able to stop drinking once you had started?: Monthly 5. How often during the last year have you failed to do what was normally expected from you becasue of drinking?: Monthly 6. How often during the last year have you needed a first drink in the morning to get yourself going after a heavy  drinking session?: Daily or almost daily 7. How often during the last year have you had a feeling of guilt of remorse after drinking?: Weekly 8. How often during the last year have you been unable to remember what happened the night before because you had been drinking?: Monthly 9. Have you or someone else been injured as a result of your drinking?: No 10. Has a relative or friend or a doctor or another health worker been concerned about your drinking or suggested you cut down?: Yes, during the last year Alcohol Use Disorder Identification Test Final Score (AUDIT): 29 Brief Intervention: Yes Substance Abuse History in the last 12 months:  Yes.   Consequences of Substance Abuse: Negative Previous Psychotropic Medications: Yes  Psychological Evaluations: No  Past Medical History:  Past Medical History:  Diagnosis Date  . ADHD (attention deficit hyperactivity disorder)   . Hypertension     Past Surgical History:  Procedure Laterality Date  . TONSILLECTOMY    . WISDOM TOOTH EXTRACTION     Family History: History reviewed. No pertinent family history.  Tobacco Screening:   Social History:  History  Alcohol Use  . 9.6 - 18.0 oz/week  . 16 - 30 Shots of liquor per week    Comment: uknown amount of liquor tonight     History  Drug Use No    Additional Social History:                           Allergies:  No Known Allergies Lab Results:  Results for orders placed or performed during the hospital encounter of 02/10/16 (from the past 48 hour(s))  Comprehensive metabolic panel     Status: Abnormal   Collection Time: 02/10/16  3:37 AM  Result Value Ref Range   Sodium 142 135 - 145 mmol/L   Potassium 3.3 (L) 3.5 - 5.1 mmol/L   Chloride 107 101 - 111 mmol/L   CO2 26 22 - 32 mmol/L   Glucose, Bld 120 (H) 65 - 99 mg/dL   BUN 6 6 - 20 mg/dL   Creatinine, Ser 0.68 0.44 - 1.00 mg/dL   Calcium 9.3 8.9 - 10.3 mg/dL   Total Protein 7.9 6.5 - 8.1 g/dL   Albumin 4.7 3.5 - 5.0  g/dL   AST 132 (H) 15 - 41 U/L   ALT 65 (H) 14 - 54 U/L   Alkaline Phosphatase 56 38 - 126 U/L   Total Bilirubin 0.4 0.3 - 1.2 mg/dL   GFR calc non Af Amer >60 >60 mL/min   GFR calc Af Amer >60 >60 mL/min    Comment: (NOTE) The eGFR has been calculated using the CKD EPI equation. This calculation has not been validated in all clinical situations. eGFR's persistently <60 mL/min signify possible Chronic Kidney Disease.    Anion gap 9 5 - 15  Ethanol     Status: Abnormal   Collection Time: 02/10/16  3:37 AM  Result Value Ref Range   Alcohol, Ethyl (B) 393 (HH) <5 mg/dL  Comment: CRITICAL RESULT CALLED TO, READ BACK BY AND VERIFIED WITH MICHELE MORTON AT 0923 02/10/16.PMH        LOWEST DETECTABLE LIMIT FOR SERUM ALCOHOL IS 5 mg/dL FOR MEDICAL PURPOSES ONLY   Salicylate level     Status: None   Collection Time: 02/10/16  3:37 AM  Result Value Ref Range   Salicylate Lvl <3.0 2.8 - 30.0 mg/dL  Acetaminophen level     Status: Abnormal   Collection Time: 02/10/16  3:37 AM  Result Value Ref Range   Acetaminophen (Tylenol), Serum <10 (L) 10 - 30 ug/mL    Comment:        THERAPEUTIC CONCENTRATIONS VARY SIGNIFICANTLY. A RANGE OF 10-30 ug/mL MAY BE AN EFFECTIVE CONCENTRATION FOR MANY PATIENTS. HOWEVER, SOME ARE BEST TREATED AT CONCENTRATIONS OUTSIDE THIS RANGE. ACETAMINOPHEN CONCENTRATIONS >150 ug/mL AT 4 HOURS AFTER INGESTION AND >50 ug/mL AT 12 HOURS AFTER INGESTION ARE OFTEN ASSOCIATED WITH TOXIC REACTIONS.   cbc     Status: Abnormal   Collection Time: 02/10/16  3:37 AM  Result Value Ref Range   WBC 7.0 3.6 - 11.0 K/uL   RBC 3.88 3.80 - 5.20 MIL/uL   Hemoglobin 14.7 12.0 - 16.0 g/dL   HCT 41.7 35.0 - 47.0 %   MCV 107.4 (H) 80.0 - 100.0 fL   MCH 37.8 (H) 26.0 - 34.0 pg   MCHC 35.2 32.0 - 36.0 g/dL   RDW 13.4 11.5 - 14.5 %   Platelets 182 150 - 440 K/uL  Urine Drug Screen, Qualitative     Status: Abnormal   Collection Time: 02/10/16  3:37 AM  Result Value Ref Range    Tricyclic, Ur Screen NONE DETECTED NONE DETECTED   Amphetamines, Ur Screen POSITIVE (A) NONE DETECTED   MDMA (Ecstasy)Ur Screen NONE DETECTED NONE DETECTED   Cocaine Metabolite,Ur [place] NONE DETECTED NONE DETECTED   Opiate, Ur Screen NONE DETECTED NONE DETECTED   Phencyclidine (PCP) Ur S NONE DETECTED NONE DETECTED   Cannabinoid 50 Ng, Ur [place] NONE DETECTED NONE DETECTED   Barbiturates, Ur Screen NONE DETECTED NONE DETECTED   Benzodiazepine, Ur Scrn NONE DETECTED NONE DETECTED   Methadone Scn, Ur NONE DETECTED NONE DETECTED    Comment: (NOTE) 076  Tricyclics, urine               Cutoff 1000 ng/mL 200  Amphetamines, urine             Cutoff 1000 ng/mL 300  MDMA (Ecstasy), urine           Cutoff 500 ng/mL 400  Cocaine Metabolite, urine       Cutoff 300 ng/mL 500  Opiate, urine                   Cutoff 300 ng/mL 600  Phencyclidine (PCP), urine      Cutoff 25 ng/mL 700  Cannabinoid, urine              Cutoff 50 ng/mL 800  Barbiturates, urine             Cutoff 200 ng/mL 900  Benzodiazepine, urine           Cutoff 200 ng/mL 1000 Methadone, urine                Cutoff 300 ng/mL 1100 1200 The urine drug screen provides only a preliminary, unconfirmed 1300 analytical test result and should not be used for non-medical 1400 purposes. Clinical consideration and professional judgment should 1500 be  applied to any positive drug screen result due to possible 1600 interfering substances. A more specific alternate chemical method 1700 must be used in order to obtain a confirmed analytical result.  1800 Gas chromato graphy / mass spectrometry (GC/MS) is the preferred 1900 confirmatory method.   Pregnancy, urine POC     Status: None   Collection Time: 02/10/16  3:54 AM  Result Value Ref Range   Preg Test, Ur NEGATIVE NEGATIVE    Comment:        THE SENSITIVITY OF THIS METHODOLOGY IS >24 mIU/mL     Blood Alcohol level:  Lab Results  Component Value Date   ETH 393 (HH) 55/97/4163    Metabolic  Disorder Labs:  No results found for: HGBA1C, MPG No results found for: PROLACTIN No results found for: CHOL, TRIG, HDL, CHOLHDL, VLDL, LDLCALC  Current Medications: Current Facility-Administered Medications  Medication Dose Route Frequency Provider Last Rate Last Dose  . acetaminophen (TYLENOL) tablet 650 mg  650 mg Oral Q6H PRN Lorree Millar B Eann Cleland, MD      . alum & mag hydroxide-simeth (MAALOX/MYLANTA) 200-200-20 MG/5ML suspension 30 mL  30 mL Oral Q4H PRN Justene Jensen B Betzy Barbier, MD      . amphetamine-dextroamphetamine (ADDERALL) tablet 20 mg  20 mg Oral BID WC Iziah Cates B Brycin Kille, MD      . chlordiazePOXIDE (LIBRIUM) capsule 50 mg  50 mg Oral QID Liliyana Thobe B Curlee Bogan, MD      . fosfomycin (MONUROL) packet 3 g  3 g Oral Once Lamisha Roussell B Liddie Chichester, MD      . hydrochlorothiazide (HYDRODIURIL) tablet 25 mg  25 mg Oral Daily Marsha Gundlach B Zetta Stoneman, MD   25 mg at 02/11/16 0827  . magnesium hydroxide (MILK OF MAGNESIA) suspension 30 mL  30 mL Oral Daily PRN Dhaval Woo B Haruto Demaria, MD      . nicotine (NICODERM CQ - dosed in mg/24 hours) patch 21 mg  21 mg Transdermal Once Clovis Fredrickson, MD   21 mg at 02/11/16 0044  . traZODone (DESYREL) tablet 100 mg  100 mg Oral QHS Evander Macaraeg B Keiyon Plack, MD   100 mg at 02/11/16 0044   PTA Medications: Prescriptions Prior to Admission  Medication Sig Dispense Refill Last Dose  . amLODipine (NORVASC) 10 MG tablet Take 1 tablet (10 mg total) by mouth daily. 30 tablet 0 02/09/2016 at Unknown time  . amphetamine-dextroamphetamine (ADDERALL) 20 MG tablet Take 20 mg by mouth 3 (three) times daily.   02/09/2016 at Unknown time  . lisinopril-hydrochlorothiazide (ZESTORETIC) 20-25 MG per tablet Take 1 tablet by mouth daily. 30 tablet 0 02/09/2016 at Unknown time    Musculoskeletal: Strength & Muscle Tone: within normal limits Gait & Station: normal Patient leans: N/A  Psychiatric Specialty Exam: I reviewed physical exam performed in the emergency room and agree with  the findings. Physical Exam  Nursing note and vitals reviewed.   Review of Systems  Genitourinary: Positive for dysuria.  Psychiatric/Behavioral: Positive for depression, substance abuse and suicidal ideas. The patient is nervous/anxious and has insomnia.   All other systems reviewed and are negative.   Blood pressure (!) 124/92, pulse (!) 118, temperature 98.2 F (36.8 C), temperature source Oral, resp. rate 18, height 5' 6"  (1.676 m), weight 74.8 kg (165 lb), last menstrual period 01/08/2016, SpO2 100 %.Body mass index is 26.63 kg/m.  See SRA.  Sleep:  Number of Hours: 4    Treatment Plan Summary: Daily contact with patient to assess and evaluate symptoms and progress in treatment and Medication management   Ms. Goetzinger is a 25 year old female with history of ADHD, depression, and alcoholism admitted for suicidal threats while drunk.  1. Suicidal ideation. The patient is able to contract for safety in the hospital.  2. Alcohol detox. She was started on Librium taper.  3. Substance abuse treatment. The patient declines residential treatment but will follow up with SA IOP program at [facility].  4. Mood. The patient is not interested in pharmacotherapy for depression. She will follow up with her regular therapist.  5. Hypertension. She is on hydrochlorothiazide.  6. ADHD. Dr. Kasandra Knudsen describes that 20 mg 3 times daily. We'll continue.  7. Smoking. Nicotine patch is available.  8. Insomnia. Trazodone is available.  9. UTI. We gave a dose of fosfomycin.  10. Disposition. She will be discharged back to home with her friends. She will follow up with Dr. Kasandra Knudsen for medication management, Holistic therapist for psychotherapy, and SA IOP program at [facility] for substance use.   Observation Level/Precautions:  15 minute checks  Laboratory:  CBC Chemistry Profile UDS UA  Psychotherapy:    Medications:    Consultations:     Discharge Concerns:    Estimated LOS:  Other:     Physician Treatment Plan for Primary Diagnosis: <principal problem not specified> Long Term Goal(s): Improvement in symptoms so as ready for discharge  Short Term Goals: Ability to identify changes in lifestyle to reduce recurrence of condition will improve, Ability to verbalize feelings will improve, Ability to disclose and discuss suicidal ideas, Ability to demonstrate self-control will improve, Ability to identify and develop effective coping behaviors will improve and Ability to maintain clinical measurements within normal limits will improve  Physician Treatment Plan for Secondary Diagnosis: Active Problems:   Major depressive disorder, recurrent severe without psychotic features (HCC)   Attention deficit hyperactivity disorder (ADHD)  Long Term Goal(s): Improvement in symptoms so as ready for discharge  Short Term Goals: Ability to identify changes in lifestyle to reduce recurrence of condition will improve, Ability to demonstrate self-control will improve and Ability to identify triggers associated with substance abuse/mental health issues will improve  I certify that inpatient services furnished can reasonably be expected to improve the patient's condition.    Orson Slick, MD 12/4/201710:40 AM
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
## 2016-02-11 NOTE — Progress Notes (Signed)
Was able to speak with on-call physician about patient c/o urinary symptoms of burning, and pain in bladder during urination and at rest, with no vaginal discharge. Pain level was a 6/10. Dr. Jennet MaduroPucilowska called to informed this nurse that she was going to see the pt this morning and treat her.

## 2016-02-11 NOTE — Plan of Care (Signed)
Problem: Safety: Goal: Ability to remain free from injury will improve Outcome: Progressing Patient has been free from injury   

## 2016-02-11 NOTE — Progress Notes (Signed)
Recreation Therapy Notes  At approximately 3:15 pm, LRT attempted assessment. Patient sleeping.  Marie Mckenzie,[name], LRT/CTRS 02/11/2016 3:16 PM

## 2016-02-11 NOTE — Progress Notes (Signed)
Recreation Therapy Notes  Date: 12.04.17 Time: 1:00 pm Location: Craft Room  Group Topic: Wellness  Goal Area(s) Addresses:  Patient will identify at least one item per dimension of health. Patient will examine areas they are deficient in.  Behavioral Response: Attentive, Interactive  Intervention: 6 Dimensions of Health  Activity: Patients were given a definition sheet with the 6 dimensions of health on it. Patients were given a worksheet with each dimension listed and were instructed to write at least one thing they were currently doing in each dimension. LRT encouraged patients to write 2-3 items.  Education: LRT educated patients on ways to improve each dimension.  Education Outcome: Acknowledges education/In group clarification offered   Clinical Observations/Feedback: Patient wrote at least 2 items in each dimension. Patient contributed to group discussion by stating what areas she was giving enough attention to, what areas she was not giving enough attention to, ways she can improve certain dimensions, how this activity relates to her admission, how this activity relates to her d/c, and what would change for her if she started being more aware of her wellness.  Marie Mckenzie,Jaki Steptoe M, LRT/CTRS 02/11/2016 3:41 PM

## 2016-02-11 NOTE — Tx Team (Signed)
Initial Treatment Plan 02/11/2016 2:20 AM Marie Mckenzie ZOX:[id number]    PATIENT STRESSORS: Financial difficulties Loss of father, best friend, and best friend's father Substance abuse   PATIENT STRENGTHS: Ability for insight Average or above average intelligence Capable of independent living Communication skills Motivation for treatment/growth Physical Health Supportive family/friends   PATIENT IDENTIFIED PROBLEMS: "I need help with my drinking. I want to be sent to rehab from here."  "I don't have ant money or transportation, and it sometimes keeps me from getting my meds."                     DISCHARGE CRITERIA:  Ability to meet basic life and health needs Adequate post-discharge living arrangements Improved stabilization in mood, thinking, and/or behavior Motivation to continue treatment in a less acute level of care Need for constant or close observation no longer present Reduction of life-threatening or endangering symptoms to within safe limits Verbal commitment to aftercare and medication compliance Withdrawal symptoms are absent or subacute and managed without 24-hour nursing intervention  PRELIMINARY DISCHARGE PLAN: Attend aftercare/continuing care group Attend 12-step recovery group Outpatient therapy Placement in alternative living arrangements  PATIENT/FAMILY INVOLVEMENT: This treatment plan has been presented to and reviewed with the patient, Marie LeschesDanielle R Mckenzie, and/or family member. The patient and family have been given the opportunity to ask questions and make suggestions.  Marla Roeunisha L Nathanyel Defenbaugh, RN 02/11/2016, 2:20 AM

## 2016-02-11 NOTE — Progress Notes (Signed)
D: Patient appears disheveled and flat. Endorsing tremors and anxiety. Denies SI/HI/AVH. States she's ready to get better and is looking for rehabilitation. Patient has been visible in the milieu. HR increased. BP 108/76, HR 126. Patient states she drinks "half a gallon of white rum a day."  A: Medication given with education. Encouragement provided.  R: Patient was compliant with medication. She has been calm and cooperative. Safety maintained with 15 min checks.

## 2016-02-11 NOTE — [facility] Suicide Risk Assessment (Signed)
[facility]   Nursing information obtained from:  Patient Demographic factors:  Adolescent or young adult, Low socioeconomic status, Unemployed Current Mental Status:  NA Loss Factors:  Financial problems / change in socioeconomic status Historical Factors:  Anniversary of important loss, Impulsivity Risk Reduction Factors:  Positive social support, Positive therapeutic relationship  Total Time spent with patient: 1 hour Principal Problem: <principal problem not specified> Diagnosis:   Patient Active Problem List   Diagnosis Date Noted  . Attention deficit hyperactivity disorder (ADHD) [F90.9] 02/11/2016  . Major depressive disorder, recurrent severe without psychotic features (HCC) [F33.2] 02/10/2016  . Suicidal ideation [R45.851] 02/10/2016  . Involuntary commitment [Z04.6] 02/10/2016  . Alcohol use disorder, severe, dependence (HCC) [F10.20] 02/10/2016  . Alcohol intoxication (HCC) [F10.929] 02/10/2016  . Substance induced mood disorder (HCC) [F19.94] 02/10/2016  . Tobacco use disorder [F17.200] 02/10/2016   Subjective Data: Suicidal ideation.  Continued Clinical Symptoms:  Alcohol Use Disorder Identification Test Final Score (AUDIT): 29 The "Alcohol Use Disorders Identification Test", Guidelines for Use in Primary Care, Second Edition.  World Science writerHealth Organization [facility](WHO). Score between 0-7:  no or low risk or alcohol related problems. Score between 8-15:  moderate risk of alcohol related problems. Score between 16-19:  high risk of alcohol related problems. Score 20 or above:  warrants further diagnostic evaluation for alcohol dependence and treatment.   CLINICAL FACTORS:   Depression:   Comorbid alcohol abuse/dependence Impulsivity Alcohol/Substance Abuse/Dependencies   Musculoskeletal: Strength & Muscle Tone: within normal limits Gait & Station: normal Patient leans: N/A  Psychiatric Specialty Exam: Physical Exam  Nursing note and vitals  reviewed.   Review of Systems  Genitourinary: Positive for dysuria.  Neurological: Positive for tremors.  Psychiatric/Behavioral: Positive for depression, substance abuse and suicidal ideas. The patient is nervous/anxious and has insomnia.   All other systems reviewed and are negative.   Blood pressure (!) 124/92, pulse (!) 118, temperature 98.2 F (36.8 C), temperature source Oral, resp. rate 18, height 5\' 6"  (1.676 m), weight 74.8 kg (165 lb), last menstrual period 01/08/2016, SpO2 100 %.Body mass index is 26.63 kg/m.  General Appearance: Casual  Eye Contact:  Good  Speech:  Clear and Coherent  Volume:  Normal  Mood:  Anxious  Affect:  Appropriate  Thought Process:  Goal Directed and Descriptions of Associations: Intact  Orientation:  Full (Time, Place, and Person)  Thought Content:  WDL  Suicidal Thoughts:  Yes.  with intent/plan  Homicidal Thoughts:  No  Memory:  Immediate;   Fair Recent;   Fair Remote;   Fair  Judgement:  Impaired  Insight:  Shallow  Psychomotor Activity:  Normal  Concentration:  Concentration: Fair and Attention Span: Fair  Recall:  FiservFair  Fund of Knowledge:  Fair  Language:  Fair  Akathisia:  No  Handed:  Right  AIMS (if indicated):     Assets:  Communication Skills Desire for Improvement Financial Resources/Insurance Housing Physical Health Resilience Social Support Transportation  ADL's:  Intact  Cognition:  WNL  Sleep:  Number of Hours: 4      COGNITIVE FEATURES THAT CONTRIBUTE TO RISK:  None    SUICIDE RISK:   Moderate:  Frequent suicidal ideation with limited intensity, and duration, some specificity in terms of plans, no associated intent, good self-control, limited dysphoria/symptomatology, some risk factors present, and identifiable protective factors, including available and accessible social support.   PLAN OF CARE: Hospital admission, medication management, absence abuse counseling, discharge planning.  Marie Mckenzie is  a  25 year old female with history of ADHD, depression, and alcoholism admitted for suicidal threats while drunk.  1. Suicidal ideation. The patient is able to contract for safety in the hospital.  2. Alcohol detox. She was started on Librium taper.  3. Substance abuse treatment. The patient declines residential treatment but [facility]  4. Mood. The patient is not interested in pharmacotherapy for depression. She will follow up with her regular therapist.  5. Hypertension. She is on hydrochlorothiazide.  6. ADHD. Dr. Janeece RiggersSu describes that 20 mg 3 times daily. We'll continue.  7. Smoking. Nicotine patch is available.  8. Insomnia. Trazodone is available.  9. Disposition. She will be discharged back to home with her friends. She will follow up with Dr. Janeece RiggersSu for medication management, Holistic therapist for psychotherapy, and [facility].  I certify that inpatient services furnished can reasonably be expected to improve the patient's condition.  Marie LineaJolanta Crystalyn Delia, MD 02/11/2016, 10:35 AM

## 2016-02-12 MED ORDER — TRAZODONE HCL 100 MG PO TABS
200.0000 mg | ORAL_TABLET | Freq: Every day | ORAL | Status: DC
  Administered 2016-02-12: 200 mg via ORAL
  Filled 2016-02-12: qty 2

## 2016-02-12 MED ORDER — TEMAZEPAM 15 MG PO CAPS
15.0000 mg | ORAL_CAPSULE | Freq: Once | ORAL | Status: AC
  Administered 2016-02-12: 15 mg via ORAL
  Filled 2016-02-12: qty 1

## 2016-02-12 MED ORDER — CHLORDIAZEPOXIDE HCL 25 MG PO CAPS
25.0000 mg | ORAL_CAPSULE | Freq: Four times a day (QID) | ORAL | Status: AC
  Administered 2016-02-12 – 2016-02-14 (×7): 25 mg via ORAL
  Filled 2016-02-12 (×7): qty 1

## 2016-02-12 NOTE — Plan of Care (Signed)
Problem: Pain Managment: Goal: General experience of comfort will improve Outcome: Progressing Patient is able to verbalize when she is in discomfort

## 2016-02-12 NOTE — BHH Counselor (Signed)
Adult Comprehensive Assessment  Patient ID: Marie Mckenzie, female   DOB: 02/18/1991, 25 y.o.   MRN: [id number]  Information Source: Information source: Patient  Current Stressors:  Employment / Job issues: unemployed Family Relationships: limited support Surveyor, quantityinancial / Lack of resources (include bankruptcy): no income Housing / Lack of housing: living with a friend and his father Physical health (include injuries & life threatening diseases): good physical health Social relationships: limited supports Substance abuse: alcohol use and potentially over-using xanax Bereavement / Loss: lost her father 2 years ago and a friend died "from  alcohol" more recently.   Living/Environment/Situation:  Living Arrangements: Non-relatives/Friends How long has patient lived in current situation?: 3 months What is atmosphere in current home: Comfortable, Supportive  Family History:  Marital status: Single What is your sexual orientation?: heterosexual Has your sexual activity been affected by drugs, alcohol, medication, or emotional stress?: none Does patient have children?: No  Childhood History:  By whom was/is the patient raised?: Both parents Description of patient's relationship with caregiver when they were a child: verbalizes in a vague way that her family is not supportive and hasn't been in a long time Did patient suffer any verbal/emotional/physical/sexual abuse as a child?: No Did patient suffer from severe childhood neglect?: No Has patient ever been sexually abused/assaulted/raped as an adolescent or adult?: No Was the patient ever a victim of a crime or a disaster?: No Witnessed domestic violence?: No Has patient been effected by domestic violence as an adult?: No  Education:  Highest grade of school patient has completed: Primary school teachergraphic design major Currently a student?: No  Employment/Work Situation:   Employment situation: Unemployed Patient's job has been impacted by current  illness: Yes Describe how patient's job has been impacted: drinking keeps her from holding a job and keeping one Has patient ever been in the [employer] and Companymilitary?: No Has patient ever served in combat?: No Did You Receive Any Psychiatric Treatment/Services While in Equities traderthe Military?: No Are There Guns or Other Weapons in Your Home?: No Are These Weapons Safely Secured?: No  Financial Resources:   Financial resources: No income  Alcohol/Substance Abuse:   If attempted suicide, did drugs/alcohol play a role in this?: No Alcohol/Substance Abuse Treatment Hx: Denies past history Has alcohol/substance abuse ever caused legal problems?: Yes  Social Support System:   Patient's Community Support System: Poor  Leisure/Recreation:   Leisure and Hobbies: cooking, video games  Strengths/Needs:      Discharge Plan:   Does patient have access to transportation?: Yes Will patient be returning to same living situation after discharge?: Yes Currently receiving community mental health services: No If no, would patient like referral for services when discharged?: Yes (What [place]?) ([facility]) Does patient have financial barriers related to discharge medications?: Yes ([facility])  Summary/Recommendations:    Pt is a 25 yo female who comes to ER complaining of suicidal ideation and stating her main goal is to stop drinking.  She describes that she has been drinking for 7 years with little to no time in between where she has stopped or been able to slow down.  While on the Behavioral Medicine Unit she will have the opportunity to participate in groups and therapeutic milieu. She will have medications managed and will detox appropriately.  Residential treatment is recommended, however she verbalizes a willingness to participate in outpatient substance abuse treatment and [facility].    Glennon MacSara P Donyelle Enyeart, MSW, LCSW. 02/12/2016

## 2016-02-12 NOTE — Tx Team (Signed)
Interdisciplinary Treatment and Diagnostic Plan Update  02/12/2016 Time of Session: 10:30am Marie LeschesDanielle R Mckenzie MRN: [id number]  Principal Diagnosis: Major depressive disorder, recurrent severe without psychotic features [facility](HCC)  Secondary Diagnoses: Principal Problem:   Major depressive disorder, recurrent severe without psychotic features (HCC) Active Problems:   Attention deficit hyperactivity disorder (ADHD)   Current Medications:  Current Facility-Administered Medications  Medication Dose Route Frequency Provider Last Rate Last Dose  . acetaminophen (TYLENOL) tablet 650 mg  650 mg Oral Q6H PRN Shari ProwsJolanta B Pucilowska, MD   650 mg at 02/12/16 1348  . alum & mag hydroxide-simeth (MAALOX/MYLANTA) 200-200-20 MG/5ML suspension 30 mL  30 mL Oral Q4H PRN Jolanta B Pucilowska, MD      . amphetamine-dextroamphetamine (ADDERALL) tablet 20 mg  20 mg Oral BID WC Jolanta B Pucilowska, MD   20 mg at 02/12/16 1603  . chlordiazePOXIDE (LIBRIUM) capsule 25 mg  25 mg Oral QID Jolanta B Pucilowska, MD      . hydrochlorothiazide (HYDRODIURIL) tablet 25 mg  25 mg Oral Daily Jolanta B Pucilowska, MD   25 mg at 02/12/16 0759  . magnesium hydroxide (MILK OF MAGNESIA) suspension 30 mL  30 mL Oral Daily PRN Jolanta B Pucilowska, MD      . nicotine (NICODERM CQ - dosed in mg/24 hours) patch 21 mg  21 mg Transdermal Daily Jolanta B Pucilowska, MD   21 mg at 02/12/16 0803  . traZODone (DESYREL) tablet 100 mg  100 mg Oral QHS Shari ProwsJolanta B Pucilowska, MD   100 mg at 02/11/16 2102   PTA Medications: Prescriptions Prior to Admission  Medication Sig Dispense Refill Last Dose  . amLODipine (NORVASC) 10 MG tablet Take 1 tablet (10 mg total) by mouth daily. 30 tablet 0 02/09/2016 at Unknown time  . amphetamine-dextroamphetamine (ADDERALL) 20 MG tablet Take 20 mg by mouth 3 (three) times daily.   02/09/2016 at Unknown time  . lisinopril-hydrochlorothiazide (ZESTORETIC) 20-25 MG per tablet Take 1 tablet by mouth daily. 30 tablet 0  02/09/2016 at Unknown time    Patient Stressors: Financial difficulties Loss of father, best friend, and best friend's father Substance abuse  Patient Strengths: Ability for insight Average or above average intelligence Capable of independent living Barrister's clerkCommunication skills Motivation for treatment/growth Physical Health Supportive family/friends  Treatment Modalities: Medication Management, Group therapy, Case management,  1 to 1 session with clinician, Psychoeducation, Recreational therapy.   Physician Treatment Plan for Primary Diagnosis: Major depressive disorder, recurrent severe without psychotic features (HCC) Long Term Goal(s): Improvement in symptoms so as ready for discharge Improvement in symptoms so as ready for discharge   Short Term Goals: Ability to identify changes in lifestyle to reduce recurrence of condition will improve Ability to verbalize feelings will improve Ability to disclose and discuss suicidal ideas Ability to demonstrate self-control will improve Ability to identify and develop effective coping behaviors will improve Ability to maintain clinical measurements within normal limits will improve Ability to identify changes in lifestyle to reduce recurrence of condition will improve Ability to demonstrate self-control will improve Ability to identify triggers associated with substance abuse/mental health issues will improve  Medication Management: Evaluate patient's response, side effects, and tolerance of medication regimen.  Therapeutic Interventions: 1 to 1 sessions, Unit Group sessions and Medication administration.  Evaluation of Outcomes: Progressing  Physician Treatment Plan for Secondary Diagnosis: Principal Problem:   Major depressive disorder, recurrent severe without psychotic features (HCC) Active Problems:   Attention deficit hyperactivity disorder (ADHD)  Long Term Goal(s): Improvement in symptoms so  as ready for discharge Improvement in  symptoms so as ready for discharge   Short Term Goals: Ability to identify changes in lifestyle to reduce recurrence of condition will improve Ability to verbalize feelings will improve Ability to disclose and discuss suicidal ideas Ability to demonstrate self-control will improve Ability to identify and develop effective coping behaviors will improve Ability to maintain clinical measurements within normal limits will improve Ability to identify changes in lifestyle to reduce recurrence of condition will improve Ability to demonstrate self-control will improve Ability to identify triggers associated with substance abuse/mental health issues will improve     Medication Management: Evaluate patient's response, side effects, and tolerance of medication regimen.  Therapeutic Interventions: 1 to 1 sessions, Unit Group sessions and Medication administration.  Evaluation of Outcomes: Progressing   RN Treatment Plan for Primary Diagnosis: Major depressive disorder, recurrent severe without psychotic features (HCC) Long Term Goal(s): Knowledge of disease and therapeutic regimen to maintain health will improve  Short Term Goals: Ability to remain free from injury will improve, Ability to verbalize frustration and anger appropriately will improve, Ability to participate in decision making will improve, Ability to identify and develop effective coping behaviors will improve and Compliance with prescribed medications will improve  Medication Management: RN will administer medications as ordered by provider, will assess and evaluate patient's response and provide education to patient for prescribed medication. RN will report any adverse and/or side effects to prescribing provider.  Therapeutic Interventions: 1 on 1 counseling sessions, Psychoeducation, Medication administration, Evaluate responses to treatment, Monitor vital signs and CBGs as ordered, Perform/monitor CIWA, COWS, AIMS and Fall Risk  screenings as ordered, Perform wound care treatments as ordered.  Evaluation of Outcomes: Progressing   LCSW Treatment Plan for Primary Diagnosis: Major depressive disorder, recurrent severe without psychotic features (HCC) Long Term Goal(s): Safe transition to appropriate next level of care at discharge, Engage patient in therapeutic group addressing interpersonal concerns.  Short Term Goals: Engage patient in aftercare planning with referrals and resources, Increase social support, Increase ability to appropriately verbalize feelings, Increase emotional regulation, Facilitate acceptance of mental health diagnosis and concerns, Facilitate patient progression through stages of change regarding substance use diagnoses and concerns, Identify triggers associated with mental health/substance abuse issues and Increase skills for wellness and recovery  Therapeutic Interventions: Assess for all discharge needs, 1 to 1 time with Social worker, Explore available resources and support systems, Assess for adequacy in community support network, Educate family and significant other(s) on suicide prevention, Complete Psychosocial Assessment, Interpersonal group therapy.  Evaluation of Outcomes: Progressing   Progress in Treatment: Attending groups: Yes. Participating in groups: Yes. Taking medication as prescribed: Yes. Toleration medication: Yes. Family/Significant other contact made: No, will contact:  friend phillip Patient understands diagnosis: No. Discussing patient identified problems/goals with staff: Yes. Medical problems stabilized or resolved: Yes. Denies suicidal/homicidal ideation: Yes. Issues/concerns per patient self-inventory: No. Other:    New problem(s) identified: No, Describe:     New Short Term/Long Term Goal(s):  Discharge Plan or Barriers:   Reason for Continuation of Hospitalization: Medication stabilization Withdrawal symptoms  Estimated Length of  Stay:  Attendees: Patient:Marie Mckenzie 02/12/2016 4:05 PM  Physician: Kristine Linea, MD 02/12/2016 4:05 PM  Nursing: Hulan Amato, RN 02/12/2016 4:05 PM  RN Care Manager: 02/12/2016 4:05 PM  Social Worker: Jake Shark, MSW, LCSW 02/12/2016 4:05 PM  Recreational Therapist: Hershal Coria, LRT 02/12/2016 4:05 PM  Other:  02/12/2016 4:05 PM  Other:  02/12/2016 4:05 PM  Other: 02/12/2016 4:05 PM  Scribe for Treatment Team: Glennon MacSara P Albina Gosney, LCSW 02/12/2016 4:05 PM

## 2016-02-12 NOTE — Progress Notes (Signed)
[facility] MD Progress Note  02/12/2016 12:29 PM Marie Mckenzie  MRN:  [id number]  Subjective:  Marie Mckenzie met with treatment team today. She reports that she feels much better and independently to go home today. Her vital signs are elevated and she is slightly shaky in spite of high-dose of Librium. She denies any somatic complaints except for tremor and tachycardia. She seems to tolerate the detox well but was unable to have a detailed discussion about discharge planning with treatment team. She slept only 2 hours last night. She started participating in groups. Her hygiene today is much better. We'll continue Librium taper.  Principal Problem: Major depressive disorder, recurrent severe without psychotic features (Marie Mckenzie) Diagnosis:   Patient Active Problem List   Diagnosis Date Noted  . Attention deficit hyperactivity disorder (ADHD) [F90.9] 02/11/2016  . Major depressive disorder, recurrent severe without psychotic features (Marie Mckenzie) [F33.2] 02/10/2016  . Suicidal ideation [R45.851] 02/10/2016  . Involuntary commitment [Z04.6] 02/10/2016  . Alcohol use disorder, severe, dependence (Marie Mckenzie) [F10.20] 02/10/2016  . Alcohol intoxication (Wink) [F10.929] 02/10/2016  . Substance induced mood disorder (Marie Mckenzie) [F19.94] 02/10/2016  . Tobacco use disorder [F17.200] 02/10/2016   Total Time spent with patient: 20 minutes  Past Psychiatric History: Alcoholism.  Past Medical History:  Past Medical History:  Diagnosis Date  . ADHD (attention deficit hyperactivity disorder)   . Hypertension     Past Surgical History:  Procedure Laterality Date  . TONSILLECTOMY    . WISDOM TOOTH EXTRACTION     Family History: History reviewed. No pertinent family history. Family Psychiatric  History: Alcoholism. Social History:  History  Alcohol Use  . 9.6 - 18.0 oz/week  . 16 - 46 Shots of liquor per week    Comment: uknown amount of liquor tonight     History  Drug Use No    Social History   Social History  .  Marital status: Single    Spouse name: N/A  . Number of children: N/A  . Years of education: N/A   Social History Main Topics  . Smoking status: Current Every Day Smoker    Packs/day: 1.00    Types: Cigarettes  . Smokeless tobacco: Never Used     Comment: Pt has order for a Nicotine Patch  . Alcohol use 9.6 - 18.0 oz/week    16 - 30 Shots of liquor per week     Comment: uknown amount of liquor tonight  . Drug use: No  . Sexual activity: Yes    Birth control/ protection: Condom   Other Topics Concern  . None   Social History Narrative  . None   Additional Social History:                         Sleep: Poor  Appetite:  Fair  Current Medications: Current Facility-Administered Medications  Medication Dose Route Frequency Provider Last Rate Last Dose  . acetaminophen (TYLENOL) tablet 650 mg  650 mg Oral Q6H PRN Marie Fredrickson, MD   650 mg at 02/12/16 0030  . alum & mag hydroxide-simeth (MAALOX/MYLANTA) 200-200-20 MG/5ML suspension 30 mL  30 mL Oral Q4H PRN Marie Gettinger B Maelie Chriswell, MD      . amphetamine-dextroamphetamine (ADDERALL) tablet 20 mg  20 mg Oral BID WC Marie Milford B Marios Gaiser, MD   20 mg at 02/12/16 0759  . chlordiazePOXIDE (LIBRIUM) capsule 25 mg  25 mg Oral QID Marie Mcelmurry B Gilbert Narain, MD      . hydrochlorothiazide (HYDRODIURIL) tablet  25 mg  25 mg Oral Daily Marie Fredrickson, MD   25 mg at 02/12/16 0759  . magnesium hydroxide (MILK OF MAGNESIA) suspension 30 mL  30 mL Oral Daily PRN Marie Ignasiak B Yexalen Deike, MD      . nicotine (NICODERM CQ - dosed in mg/24 hours) patch 21 mg  21 mg Transdermal Daily Marie Timothy B Tremayne Sheldon, MD   21 mg at 02/12/16 0803  . traZODone (DESYREL) tablet 100 mg  100 mg Oral QHS Marie Fredrickson, MD   100 mg at 02/11/16 2102    Lab Results: No results found for this or any previous visit (from the past 48 hour(s)).  Blood Alcohol level:  Lab Results  Component Value Date   ETH 393 (HH) 15/40/0867    Metabolic Disorder  Labs: No results found for: HGBA1C, MPG No results found for: PROLACTIN No results found for: CHOL, TRIG, HDL, CHOLHDL, VLDL, LDLCALC  Physical Findings: AIMS:  , ,  ,  ,    CIWA:  CIWA-Ar Total: 4 COWS:     Musculoskeletal: Strength & Muscle Tone: within normal limits Gait & Station: normal Patient leans: N/A  Psychiatric Specialty Exam: Physical Exam  Nursing note and vitals reviewed.   Review of Systems  Cardiovascular: Positive for palpitations.  Neurological: Positive for tremors.  Psychiatric/Behavioral: Positive for substance abuse. The patient is nervous/anxious and has insomnia.   All other systems reviewed and are negative.   Blood pressure (!) 119/91, pulse (!) 109, temperature 98.8 F (37.1 C), temperature source Oral, resp. rate 20, height 5' 6"  (1.676 m), weight 74.8 kg (165 lb), last menstrual period 01/08/2016, SpO2 100 %.Body mass index is 26.63 kg/m.  General Appearance: Casual  Eye Contact:  Good  Speech:  Clear and Coherent  Volume:  Decreased  Mood:  Anxious  Affect:  Blunt  Thought Process:  Goal Directed and Descriptions of Associations: Tangential  Orientation:  Full (Time, Place, and Person)  Thought Content:  WDL  Suicidal Thoughts:  No  Homicidal Thoughts:  No  Memory:  Immediate;   Fair Recent;   Fair Remote;   Fair  Judgement:  Impaired  Insight:  Lacking  Psychomotor Activity:  Psychomotor Retardation  Concentration:  Concentration: Fair and Attention Span: Fair  Recall:  [employer] of Knowledge:  Fair  Language:  Fair  Akathisia:  No  Handed:  Right  AIMS (if indicated):     Assets:  Communication Skills Desire for Improvement Financial Resources/Insurance Housing Physical Health Resilience Social Support Vocational/Educational  ADL's:  Intact  Cognition:  WNL  Sleep:  Number of Hours: 2.25     Treatment Plan Summary: Daily contact with patient to assess and evaluate symptoms and progress in treatment and Medication  management   Marie Mckenzie is a 25 year old female with history of ADHD, depression, and alcoholism admitted for suicidal threats while drunk.  1. Suicidal ideation. The patient is able to contract for safety in the hospital.  2. Alcohol detox. She was started on Librium taper. I will lower Librium to 25 mg qid.  3. Substance abuse treatment. The patient declines residential treatment but will follow up with SA IOP program at [facility].  4. Mood. The patient is not interested in pharmacotherapy for depression. She will follow up with her regular therapist.  5. Hypertension. She is on hydrochlorothiazide.  6. ADHD. Dr. Kasandra Knudsen describes that 20 mg 3 times daily. We'll continue.  7. Smoking. Nicotine patch is available.  8. Insomnia. Trazodone is available.  9. UTI. We gave a dose of fosfomycin.  10. Disposition. She will be discharged back to home with her friends. She will follow up with Dr. Kasandra Knudsen for medication management, Holistic therapist for psychotherapy, and SA IOP program at [facility] for substance use.  Orson Slick, MD 02/12/2016, 12:29 PM

## 2016-02-12 NOTE — [facility] Group Notes (Signed)
[facility] LCSW Group Therapy   02/12/2016 9:30 am   Type of Therapy: Group Therapy   Participation Level: Pt invited but did not attend.    Hampton AbbotKadijah Malaquias Lenker, MSW, LCSWA 02/12/2016, 10:26AM

## 2016-02-12 NOTE — Progress Notes (Signed)
Recreation Therapy Notes  Date: 12.05.17 Time: 2:00 pm Location: Craft Room  Group Topic: Self-expression  Goal Area(s) Addresses:  Patient will be able to identify a color the represents each emotion. Patient will verbalize benefit of using art as a means of self-expression. Patient will verbalize one emotion experienced while participating in group.  Behavioral Response: Attentive, Interactive  Intervention: The Colors Within Me  Activity: Patients were given blank face worksheets and were instructed to pick a color for each emotion they were feeling and to show how much of that emotion they were feeling on the worksheet.  Education: LRT educated patients on other forms of self-expression.  Education Outcome: Acknowledges education/In group clarification offered.   Clinical Observations/Feedback: Patient picked colors for the emotions she was feeling. Patient contributed to group discussion by stating what she was feeling, how her emotions affect her treatment in the hospital, that her emotions are dynamic, and what steps she can take to change her emotions to more positive ones.  Jacquelynn CreeGreene,Amol Domanski M, LRT/CTRS 02/12/2016 4:10 PM

## 2016-02-12 NOTE — Progress Notes (Signed)
Recreation Therapy Notes  INPATIENT RECREATION THERAPY ASSESSMENT  Patient Details Name: Marie LeschesDanielle R Mcveigh MRN: [id number] DOB: 04/08/1990 Today's Date: 02/12/2016  Patient Stressors: Family, Death, Work, Other (Comment) (Not good relationship with mother and brother - they are drug users and want her medication; best friend died in the summer; had a job lined up, but it fel;l through; alcoholism)  Coping Skills:   Substance Abuse, Avoidance, Exercise, Art/Dance, Talking, Music  Personal Challenges: Communication, Concentration, Decision-Making, Problem-Solving, Self-Esteem/Confidence, Social Interaction, Stress Management, Substance Abuse, Time Management  Leisure Interests (2+):  Art - Draw, Games - Video games, Individual - Other (Comment) (Watch Anime)  Awareness of Community Resources:  Yes  [place]  Current Use: Yes  If no, Barriers?:    Patient Strengths:  Style, creativity  Patient Identified Areas of Improvement:  Alcoholism, to stop internalizing things  Current Recreation Participation:  Playing video games, watching Anime, coloring, drinking, poetry, talking with friends  Patient Goal for Hospitalization:  To be able to not internalize things  [place]  [place] Residence[place]  [place]   Current SI (including self-harm):  No  Current HI:  No  Consent to Intern Participation: N/A   Jacquelynn CreeGreene,Trinika Cortese M, LRT/CTRS 02/12/2016, 3:30 PM

## 2016-02-12 NOTE — Progress Notes (Signed)
Patient has been up all day, even though she has did not sleep long last night. Has been medication compliant and goes to groups. Preoccupied with discharging and became a little upset when she was not discharged today. Requesting medications changes and time changes. Trazodone was increased for her to be able to try and sleep tonight. Denies SI/HI/AVH. Did c/o of shoulder pain from shoulder pain and given prn tylenol. Goal for the day was to return home so she can meet with Mr. Lorella NimrodHarvey to continue treatment. Rates depression and anxiety 2/10 and hopelessness 1/10. Thanked staff for all of their help and states she is starting to feel like herself again. Remains on Q15 minute checks for safety. Will continue to monitor.

## 2016-02-12 NOTE — [facility] Group Notes (Signed)
[facility] Group Notes:  (Nursing/MHT/Case Management/Adjunct)  Date:  02/12/2016  Time:  10:20 PM  Type of Therapy:  Group Therapy  Participation Level:  Active  Participation Quality:  Appropriate  Affect:    Cognitive:  Appropriate  Insight:  Appropriate  Engagement in Group:  Engaged  Modes of Intervention:  Discussion  Summary of Progress/Problems:  Marie EkJanice Marie Lyndon Mckenzie 02/12/2016, 10:20 PM

## 2016-02-13 MED ORDER — QUETIAPINE FUMARATE 25 MG PO TABS
50.0000 mg | ORAL_TABLET | Freq: Every day | ORAL | Status: DC
  Administered 2016-02-13: 50 mg via ORAL
  Filled 2016-02-13: qty 2

## 2016-02-13 MED ORDER — TEMAZEPAM 15 MG PO CAPS
15.0000 mg | ORAL_CAPSULE | Freq: Every evening | ORAL | Status: DC | PRN
  Administered 2016-02-13: 15 mg via ORAL
  Filled 2016-02-13: qty 1

## 2016-02-13 MED ORDER — IBUPROFEN 600 MG PO TABS
600.0000 mg | ORAL_TABLET | Freq: Four times a day (QID) | ORAL | Status: DC | PRN
  Administered 2016-02-13 – 2016-02-14 (×2): 600 mg via ORAL
  Filled 2016-02-13 (×3): qty 1

## 2016-02-13 MED ORDER — HYDROCHLOROTHIAZIDE 25 MG PO TABS: 25.0000 mg | ORAL_TABLET | Freq: Every day | ORAL | 1 refills | Status: DC

## 2016-02-13 NOTE — Progress Notes (Signed)
D: Patient appears brighter but still anxious. Denies SI/HI/AVH. Patient states she's ready to leave but is anxious about the timing of her adderall. Patient has been visible in the milieu and interacts well with patients. Patient c/o not being able sleep even after trazodone.    A: Medication given with education. Encouragement provided. MD notified of patient c/o not being able to sleep. Restoril 15 mg ordered.  R: Patient was compliant with medication. She has been calm and cooperative. Safety maintained with 15 min checks

## 2016-02-13 NOTE — [facility] Group Notes (Signed)
[facility] LCSW Group Therapy   02/13/2016  9:30 am   Type of Therapy: Group Therapy   Participation Level: Active   Participation Quality: Attentive, Sharing and Supportive   Affect: Appropriate   Cognitive: Alert and Oriented   Insight: Developing/Improving and Engaged   Engagement in Therapy: Developing/Improving and Engaged   Modes of Intervention: Clarification, Confrontation, Discussion, Education, Exploration, Limit-setting, Orientation, Problem-solving, Rapport Building, Dance movement psychotherapisteality Testing, Socialization and Support   Summary of Progress/Problems: The topic for group today was emotional regulation. This group focused on both positive and negative emotion identification and allowed  group members to process ways to identify feelings, regulate negative emotions, and find healthy ways to manage internal/external emotions. Group members were asked to reflect on a time when their reaction to an emotion led to a negative outcome and explored how alternative responses using emotion regulation would have benefited them. Group members were also asked to discuss a time when emotion regulation was utilized when a negative emotion was experienced. Pt defined emotion regulation as ways to correspond with people and their emotions. She identified two negative emotions that led to her hospitalization; shame and regret. She stated to regulate those negative emotions, she will participate in AA meetings and journaling as ways to cope.     Marie AbbotKadijah Jamisen Hawes, MSW, LCSWA 02/13/2016, 11:40AM

## 2016-02-13 NOTE — Progress Notes (Signed)
Recreation Therapy Notes  Date: 12.06.17 Time: 1:00 pm Location: Craft Room  Group Topic: Self-esteem  Goal Area(s) Addresses:  Patient will be able to identify benefit of self-esteem. Patient will be able to identify ways to increase self-esteem.  Behavioral Response: Attentive, Interactive  Intervention: Self-Portrait  Activity: Patients were given blank face worksheets and were instructed to draw their self-portrait of how they were feeling. Patients then wrote their name and a positive trait about themselves on paper and wrote positive traits about everyone in group. After reading positive comments, patients drew their self-portrait of how they felt.   Education: LRT educated patients on ways they can increase their self-esteem.  Education Outcome: Acknowledges education/In group clarification offered   Clinical Observations/Feedback: Patient drew lines and shapes on both her self-portraits. Patient wrote positive trait about self and peers. Patient contributed to group discussion by stating how how her self-esteem affects her, and how she can increase her self-esteem.  Jacquelynn CreeGreene,Earlie Schank M, LRT/CTRS 02/13/2016 3:43 PM

## 2016-02-13 NOTE — Plan of Care (Signed)
Problem: Medication: Goal: Compliance with prescribed medication regimen will improve Outcome: Progressing Patient has been medication compliance

## 2016-02-13 NOTE — Plan of Care (Signed)
Problem: [facility] Participation in Recreation Therapeutic Interventions Goal: STG-Patient will identify at least five coping skills for ** STG: Coping Skills - Within 4 treatment sessions, patient will verbalize at least 5 coping skills for substance abuse in each of 2 treatment sessions to decrease substance abuse post d/c.  Outcome: Progressing Treatment Session 1; Completed 1 out of 2: At approximately 11:25 am, LRT met with patient in consultation room. Patient verbalized 5 coping skills for substance abuse. LRT educated patient on leisure and why it is important to implement it into her schedule. LRT educated and provided patient with blank schedules to help her plan her day and try to avoid using substances. LRT educated patient on healthy support systems.  Leonette Monarch, LRT/CTRS 12.06.17 2:08 pm Goal: STG-Other Recreation Therapy Goal (Specify) STG: Stress Management - Within 4 treatment sessions, patient will verbalize understanding of the stress management techniques in each of 2 treatment sessions to increase stress management skills post d/c.  Outcome: Progressing Treatment Session 1; Completed 1 out of 2: At approximately 11:25 am, LRT met with patient in consultation room. LRT educated and provided patient with stress management techniques. Patient verbalize understanding. LRT encouraged patient to read over and practice the stress management techniques.  Leonette Monarch, LRT/CTRS 12.06.17 2:15 pm

## 2016-02-13 NOTE — Progress Notes (Signed)
Patient has been medication compliant and has been attending groups today. Still wondering why her medication for Adderall was changed. Sleep medication was changed to Seroquel. Denies SI/HI/AVH. Did c/o of shoulder pain from shoulder pain and given prn motrin with relief. Goal for the day was learning that she bottles up her emotions, figuring out her triggers, and how to cope and avoid turning to alcohol. Remains on Q15 minute checks for safety. Will continue to monitor.

## 2016-02-13 NOTE — [facility] Group Notes (Signed)
[facility] Group Notes:  (Nursing/MHT/Case Management/Adjunct)  Date:  02/13/2016  Time:  4:09 PM  Type of Therapy:  Psychoeducational Skills  Participation Level:  Active  Participation Quality:  Appropriate, Attentive and Supportive  Affect:  Flat  Cognitive:  Appropriate  Insight:  Appropriate  Engagement in Group:  Engaged and Supportive  Modes of Intervention:  Discussion, Education and Exploration  Summary of Progress/Problems:  Twanna Hymanda C Madison Direnzo 02/13/2016, 4:09 PM

## 2016-02-13 NOTE — Plan of Care (Signed)
Problem: Safety: Goal: Periods of time without injury will increase Outcome: Progressing Patient has been without injury during this shift.    

## 2016-02-13 NOTE — Progress Notes (Signed)
[facility] MD Progress Note  02/13/2016 1:39 PM Marie Mckenzie  MRN:  [id number]  Subjective:  Marie Mckenzie is cool and collected today. Yesterday she was very adamant to leave the unit immediately in the middle of her alcohol detox. She has been drinking half a gallon of rum daily and her blood alcohol level on Sunday afternoon was 400. She is on Librium taper. Her heart rate is slightly elevated. There is slight tremor in her extremities. Other than that she tolerates detox well. She still is not interested in pharmacotherapy and her intention is to follow up with a holistic therapist after discharge. She did meet with [facility] SA IOP representative and is semi-interested in participation in that program. Marie Mckenzie called her friends who are very much in support of her participation. The patient realizes that if outpatient treatment is not successful our recommendation is to enter long-term residential treatment program. She denies any symptoms of depression, anxiety, or psychosis. She is not suicidal or homicidal. She slept 6 hours after we increased dose of trazodone. She complains of back pain and is treated with ibuprofen.   Per nursing staff.   D: Patient appears brighter but still anxious. Denies SI/HI/AVH. Patient states she's ready to leave but is anxious about the timing of her adderall. Patient has been visible in the milieu and interacts well with patients. Patient c/o not being able sleep even after trazodone.    A: Medication given with education. Encouragement provided. MD notified of patient c/o not being able to sleep. Restoril 15 mg ordered.  R: Patient was compliant with medication. She has been calm and cooperative. Safety maintained with 15 min checks  Principal Problem: Major depressive disorder, recurrent severe without psychotic features (HCC) Diagnosis:   Patient Active Problem List   Diagnosis Date Noted  . Attention deficit hyperactivity disorder (ADHD) [F90.9] 02/11/2016  .  Major depressive disorder, recurrent severe without psychotic features (HCC) [F33.2] 02/10/2016  . Suicidal ideation [R45.851] 02/10/2016  . Involuntary commitment [Z04.6] 02/10/2016  . Alcohol use disorder, severe, dependence (HCC) [F10.20] 02/10/2016  . Alcohol intoxication (HCC) [F10.929] 02/10/2016  . Substance induced mood disorder (HCC) [F19.94] 02/10/2016  . Tobacco use disorder [F17.200] 02/10/2016   Total Time spent with patient: 20 minutes  Past Psychiatric History: Alcoholism.  Past Medical History:  Past Medical History:  Diagnosis Date  . ADHD (attention deficit hyperactivity disorder)   . Hypertension     Past Surgical History:  Procedure Laterality Date  . TONSILLECTOMY    . WISDOM TOOTH EXTRACTION     Family History: History reviewed. No pertinent family history. Family Psychiatric  History: Alcoholism. Social History:  History  Alcohol Use  . 9.6 - 18.0 oz/week  . 16 - 30 Shots of liquor per week    Comment: uknown amount of liquor tonight     History  Drug Use No    Social History   Social History  . Marital status: Single    Spouse name: N/A  . Number of children: N/A  . Years of education: N/A   Social History Main Topics  . Smoking status: Current Every Day Smoker    Packs/day: 1.00    Types: Cigarettes  . Smokeless tobacco: Never Used     Comment: Pt has order for a Nicotine Patch  . Alcohol use 9.6 - 18.0 oz/week    16 - 30 Shots of liquor per week     Comment: uknown amount of liquor tonight  . Drug use:  No  . Sexual activity: Yes    Birth control/ protection: Condom   Other Topics Concern  . None   Social History Narrative  . None   Additional Social History:                         Sleep: Poor  Appetite:  Fair  Current Medications: Current Facility-Administered Medications  Medication Dose Route Frequency Provider Last Rate Last Dose  . acetaminophen (TYLENOL) tablet 650 mg  650 mg Oral Q6H PRN Shari ProwsJolanta B  Kamylah Manzo, MD   650 mg at 02/13/16 0338  . alum & mag hydroxide-simeth (MAALOX/MYLANTA) 200-200-20 MG/5ML suspension 30 mL  30 mL Oral Q4H PRN Jaidin Ugarte B Nyeemah Jennette, MD      . amphetamine-dextroamphetamine (ADDERALL) tablet 20 mg  20 mg Oral BID WC Cuba Natarajan B Cyrus Ramsburg, MD   20 mg at 02/13/16 0831  . chlordiazePOXIDE (LIBRIUM) capsule 25 mg  25 mg Oral QID Kamisha Ell B Shanequia Kendrick, MD   25 mg at 02/13/16 1230  . hydrochlorothiazide (HYDRODIURIL) tablet 25 mg  25 mg Oral Daily Delia Sitar B Vander Kueker, MD   25 mg at 02/13/16 0831  . ibuprofen (ADVIL,MOTRIN) tablet 600 mg  600 mg Oral Q6H PRN Shari ProwsJolanta B Haruki Arnold, MD   600 mg at 02/13/16 0926  . magnesium hydroxide (MILK OF MAGNESIA) suspension 30 mL  30 mL Oral Daily PRN Monaca Wadas B Alayja Armas, MD      . nicotine (NICODERM CQ - dosed in mg/24 hours) patch 21 mg  21 mg Transdermal Daily Carynn Felling B Airik Goodlin, MD   21 mg at 02/13/16 0833  . traZODone (DESYREL) tablet 200 mg  200 mg Oral QHS Damiel Barthold B Ajmal Kathan, MD   200 mg at 02/12/16 2105    Lab Results: No results found for this or any previous visit (from the past 48 hour(s)).  Blood Alcohol level:  Lab Results  Component Value Date   ETH 393 (HH) 02/10/2016    Metabolic Disorder Labs: No results found for: HGBA1C, MPG No results found for: PROLACTIN No results found for: CHOL, TRIG, HDL, CHOLHDL, VLDL, LDLCALC  Physical Findings: AIMS:  , ,  ,  ,    CIWA:  CIWA-Ar Total: 2 COWS:     Musculoskeletal: Strength & Muscle Tone: within normal limits Gait & Station: normal Patient leans: N/A  Psychiatric Specialty Exam: Physical Exam  Nursing note and vitals reviewed.   Review of Systems  Cardiovascular: Positive for palpitations.  Neurological: Positive for tremors.  Psychiatric/Behavioral: Positive for substance abuse. The patient is nervous/anxious and has insomnia.   All other systems reviewed and are negative.   Blood pressure 105/72, pulse 98, temperature 98.3 F (36.8 C),  temperature source Oral, resp. rate 18, height 5\' 6"  (1.676 m), weight 74.8 kg (165 lb), last menstrual period 01/08/2016, SpO2 100 %.Body mass index is 26.63 kg/m.  General Appearance: Casual  Eye Contact:  Good  Speech:  Clear and Coherent  Volume:  Decreased  Mood:  Anxious  Affect:  Blunt  Thought Process:  Goal Directed and Descriptions of Associations: Tangential  Orientation:  Full (Time, Place, and Person)  Thought Content:  WDL  Suicidal Thoughts:  No  Homicidal Thoughts:  No  Memory:  Immediate;   Fair Recent;   Fair Remote;   Fair  Judgement:  Impaired  Insight:  Lacking  Psychomotor Activity:  Psychomotor Retardation  Concentration:  Concentration: Fair and Attention Span: Fair  Recall:  [employer]  Fund of Knowledge:  Fair  Language:  Fair  Akathisia:  No  Handed:  Right  AIMS (if indicated):     Assets:  Communication Skills Desire for Improvement Financial Resources/Insurance Housing Physical Health Resilience Social Support Vocational/Educational  ADL's:  Intact  Cognition:  WNL  Sleep:  Number of Hours: 6     Treatment Plan Summary: Daily contact with patient to assess and evaluate symptoms and progress in treatment and Medication management   Marie Mckenzie is a 25 year old female with history of ADHD, depression, and alcoholism admitted for suicidal threats while drunk.  1. Suicidal ideation. The patient is able to contract for safety in the hospital.  2. Alcohol detox. She was started on Librium taper. I will lower Librium to 25 mg qid.  3. Substance abuse treatment. The patient declines residential treatment but will follow up [facility].  4. Mood. The patient is not interested in pharmacotherapy for depression. She will follow up with her regular therapist.  5. Hypertension. She is on hydrochlorothiazide.  6. ADHD. Marie Mckenzie prescribes that 20 mg 3 times daily. We'll continue.  7. Smoking. Nicotine patch is available.  8.  Insomnia. Trazodone is available.  9. UTI. We gave a dose of fosfomycin.  10. Back pain. She is on ibuprofen.   11. Disposition. She will be discharged back to home with her friends. She will follow up with Marie Mckenzie for medication management, Holistic therapist for psychotherapy, and [facility].  Marie LineaJolanta Aldous Housel, MD 02/13/2016, 1:39 PM

## 2016-02-13 NOTE — [facility] Suicide Risk Assessment (Deleted)
[facility] Discharge Suicide Risk Assessment   Principal Problem: Major depressive disorder, recurrent severe without psychotic features [facility](HCC) Discharge Diagnoses:  [facility]   Diagnosis Date Noted  . Attention deficit hyperactivity disorder (ADHD) [F90.9] 02/11/2016  . Major depressive disorder, recurrent severe without psychotic features (HCC) [F33.2] 02/10/2016  . Suicidal ideation [R45.851] 02/10/2016  . Involuntary commitment [Z04.6] 02/10/2016  . Alcohol use disorder, severe, dependence (HCC) [F10.20] 02/10/2016  . Alcohol intoxication (HCC) [F10.929] 02/10/2016  . Substance induced mood disorder (HCC) [F19.94] 02/10/2016  . Tobacco use disorder [F17.200] 02/10/2016    Total Time spent with patient: 30 minutes  Musculoskeletal: Strength & Muscle Tone: within normal limits Gait & Station: normal Patient leans: N/A  Psychiatric Specialty Exam: Review of Systems  Psychiatric/Behavioral: Positive for substance abuse. The patient has insomnia.   All other systems reviewed and are negative.   Blood pressure 105/72, pulse 98, temperature 98.3 F (36.8 C), temperature source Oral, resp. rate 18, height 5\' 6"  (1.676 m), weight 74.8 kg (165 lb), last menstrual period 01/08/2016, SpO2 100 %.Body mass index is 26.63 kg/m.  General Appearance: Casual  Eye Contact::  Good  Speech:  Clear and Coherent409  Volume:  Normal  Mood:  Anxious  Affect:  Blunt  Thought Process:  Goal Directed and Descriptions of Associations: Intact  Orientation:  Full (Time, Place, and Person)  Thought Content:  WDL  Suicidal Thoughts:  No  Homicidal Thoughts:  No  Memory:  Immediate;   Fair Recent;   Fair Remote;   Fair  Judgement:  Impaired  Insight:  Shallow  Psychomotor Activity:  Normal  Concentration:  Fair  Recall:  [employer]Fair  Fund of Knowledge:Fair  Language: Fair  Akathisia:  No  Handed:  Right  AIMS (if indicated):     Assets:  Communication Skills Desire for  Improvement Financial Resources/Insurance Housing Physical Health Resilience Social Support Talents/Skills  Sleep:  Number of Hours: 6  Cognition: WNL  ADL's:  Intact   Mental Status Per Nursing Assessment::   On Admission:  NA  Demographic Factors:  Caucasian, Low socioeconomic status and Unemployed  Loss Factors: NA  Historical Factors: Impulsivity  Risk Reduction Factors:   Sense of responsibility to family, Living with another person, especially a relative, Positive social support and Positive therapeutic relationship  Continued Clinical Symptoms:  Depression:   Comorbid alcohol abuse/dependence Impulsivity Insomnia Alcohol/Substance Abuse/Dependencies  Cognitive Features That Contribute To Risk:  None    Suicide Risk:  Minimal: No identifiable suicidal ideation.  Patients presenting with no risk factors but with morbid ruminations; may be classified as minimal risk based on the severity of the depressive symptoms  Follow-up Information    [facility]. Go to.   Why:  Monday, Wednesday, Friday between 9-12pm for Substance Abuse int                  Contact information: [address] [place] [address] [phone]        [facility]. Go on 02/21/2016.   Why:  10:00am, Please reschedule if this doesn't work for you. Contact information: [address] [place] [phone] FAX          Plan Of Care/Follow-up recommendations:  Activity:  As tolerated. Diet:  Low sodium heart healthy. Other:  Keep follow-up appointments.  Kristine LineaJolanta Cristie Mckinney, MD 02/13/2016, 3:38 PM

## 2016-02-14 MED ORDER — QUETIAPINE FUMARATE 50 MG PO TABS: 50.0000 mg | ORAL_TABLET | Freq: Every day | ORAL | 1 refills | Status: DC

## 2016-02-14 MED ORDER — DISULFIRAM 250 MG PO TABS
250.0000 mg | ORAL_TABLET | Freq: Every day | ORAL | Status: DC
  Administered 2016-02-14: 250 mg via ORAL
  Filled 2016-02-14: qty 1

## 2016-02-14 MED ORDER — DISULFIRAM 250 MG PO TABS: 250.0000 mg | ORAL_TABLET | Freq: Every day | ORAL | 1 refills | Status: DC

## 2016-02-14 MED ORDER — HYDROCHLOROTHIAZIDE 25 MG PO TABS: 25.0000 mg | ORAL_TABLET | Freq: Every day | ORAL | 1 refills | Status: DC

## 2016-02-14 NOTE — Discharge Summary (Signed)
Physician Discharge Summary Note  Patient:  Marie Mckenzie is an 25 y.o., female MRN:  [id number] DOB:  07/18/1990 Patient phone:  [phone] (home)  [place]   [address] [place] [address],  Total Time spent with patient: 30 minutes  Date of Admission:  02/10/2016 Date of Discharge: 02/14/2016  Reason for Admission:  Suicidal ideation.  [name] is a 25 year old female with history of ADHD, depression, and alcoholism admitted for suicidal threats while drunk.  1. Suicidal ideation. The patient is able to contract for safety in the hospital.  2. Alcohol detox. She was started on Librium taper.  3. Substance abuse treatment. The patient declines residential treatment but will [facility]. She decided to start Antabuse.   4. Mood. The patient is not interested in pharmacotherapy for depression. She will follow up with her regular therapist.  5. Hypertension. She is on hydrochlorothiazide.  6. ADHD. Dr. Janeece RiggersSu describes that 20 mg 3 times daily. We'll continue.  7. Smoking. Nicotine patch is available.  8. Insomnia. Trazodone is available.  9. UTI. We gave a dose of fosfomycin.  10. Disposition. She will be discharged back to home with her friends. She will follow up with Dr. Janeece RiggersSu for medication management, Holistic therapist for psychotherapy, and [facility].  Principal Problem: Major depressive disorder, recurrent severe without psychotic features [facility](HCC) Discharge Diagnoses: Patient Active Problem List   Diagnosis Date Noted  . Attention deficit hyperactivity disorder (ADHD) [F90.9] 02/11/2016  . Major depressive disorder, recurrent severe without psychotic features (HCC) [F33.2] 02/10/2016  . Suicidal ideation [R45.851] 02/10/2016  . Involuntary commitment [Z04.6] 02/10/2016  . Alcohol use disorder, severe, dependence (HCC) [F10.20] 02/10/2016  . Alcohol intoxication (HCC) [F10.929] 02/10/2016  .  Substance induced mood disorder (HCC) [F19.94] 02/10/2016  . Tobacco use disorder [F17.200] 02/10/2016    Past Medical History:  Past Medical History:  Diagnosis Date  . ADHD (attention deficit hyperactivity disorder)   . Hypertension     Past Surgical History:  Procedure Laterality Date  . TONSILLECTOMY    . WISDOM TOOTH EXTRACTION     Family History: History reviewed. No pertinent family history.  Social History:  History  Alcohol Use  . 9.6 - 18.0 oz/week  . 16 - 30 Shots of liquor per week    Comment: uknown amount of liquor tonight     History  Drug Use No    Social History   Social History  . Marital status: Single    Spouse name: N/A  . Number of children: N/A  . Years of education: N/A   Social History Main Topics  . Smoking status: Current Every Day Smoker    Packs/day: 1.00    Types: Cigarettes  . Smokeless tobacco: Never Used     Comment: Pt has order for a Nicotine Patch  . Alcohol use 9.6 - 18.0 oz/week    16 - 30 Shots of liquor per week     Comment: uknown amount of liquor tonight  . Drug use: No  . Sexual activity: Yes    Birth control/ protection: Condom   Other Topics Concern  . None   Social History Narrative  . None    Hospital Course:    [name] is a 25 year old female with history of ADHD, depression, and alcoholism admitted for suicidal threats while drunk.  1. Suicidal ideation. Resolved. The patient is able to contract for safety. She is forward thinking  and optimistic about the future.  2. Alcohol detox. She completed Librium taper. Vital signs were stable.   3. Substance abuse treatment. The patient declines residential treatment but will [facility]. She was not interested in pharmacotherapy for alcoholism.  4. Mood. The patient is not interested in pharmacotherapy for depression. She will follow up with her regular therapist.  5. Hypertension. She is on hydrochlorothiazide.  6. ADHD.  Dr. Janeece RiggersSu prescribes that 20 mg 3 times daily.   7. Smoking. Nicotine patch is available.  8. Insomnia. She dis not respond to Trazodone or Restoril. She slept better with Seroquel.   9. UTI. We gave a dose of fosfomycin.  10. Back pain. She is on ibuprofen.   11. Disposition. She was discharged back to home with friends. She will follow up with Dr. Janeece RiggersSu for medication management, Holistic therapist for psychotherapy, and [facility] for [facility].  Physical Findings: AIMS:  , ,  ,  ,    CIWA:  CIWA-Ar Total: 2 COWS:     Musculoskeletal: Strength & Muscle Tone: within normal limits Gait & Station: normal Patient leans: N/A  Psychiatric Specialty Exam: Physical Exam  Nursing note and vitals reviewed.   Review of Systems  Psychiatric/Behavioral: Positive for substance abuse. The patient has insomnia.   All other systems reviewed and are negative.   Blood pressure 121/83, pulse 85, temperature 97.5 F (36.4 C), temperature source Oral, resp. rate 18, height 5\' 6"  (1.676 m), weight 74.8 kg (165 lb), last menstrual period 01/08/2016, SpO2 100 %.Body mass index is 26.63 kg/m.  General Appearance: Casual  Eye Contact:  Good  Speech:  Clear and Coherent  Volume:  Normal  Mood:  Anxious  Affect:  Blunt  Thought Process:  Goal Directed and Descriptions of Associations: Intact  Orientation:  Full (Time, Place, and Person)  Thought Content:  WDL  Suicidal Thoughts:  No  Homicidal Thoughts:  No  Memory:  Immediate;   Fair Recent;   Fair Remote;   Fair  Judgement:  Impaired  Insight:  Shallow  Psychomotor Activity:  Normal  Concentration:  Concentration: Fair and Attention Span: Fair  Recall:  [employer]Fair  Fund of Knowledge:  Fair  Language:  Fair  Akathisia:  No  Handed:  Right  AIMS (if indicated):     Assets:  Communication Skills Desire for Improvement Financial Resources/Insurance Housing Physical Health Resilience Social Support  ADL's:  Intact   Cognition:  WNL  Sleep:  Number of Hours: 7.45     Have you used any form of tobacco in the last 30 days? (Cigarettes, Smokeless Tobacco, Cigars, and/or Pipes): No  Has this patient used any form of tobacco in the last 30 days? (Cigarettes, Smokeless Tobacco, Cigars, and/or Pipes) Yes, No  Blood Alcohol level:  Lab Results  Component Value Date   ETH 393 (HH) 02/10/2016    Metabolic Disorder Labs:  No results found for: HGBA1C, MPG No results found for: PROLACTIN No results found for: CHOL, TRIG, HDL, CHOLHDL, VLDL, LDLCALC  See Psychiatric Specialty Exam and Suicide Risk Assessment completed by Attending Physician prior to discharge.  Discharge destination:  Home  Is patient on multiple antipsychotic therapies at discharge:  No   Has Patient had three or more failed trials of antipsychotic monotherapy by history:  No  Recommended Plan for Multiple Antipsychotic Therapies: NA  Discharge Instructions    Diet - low sodium heart healthy    Complete  by:  As directed    Increase activity slowly    Complete by:  As directed        Medication List    STOP taking these medications   amLODipine 10 MG tablet Commonly known as:  NORVASC   lisinopril-hydrochlorothiazide 20-25 MG tablet Commonly known as:  ZESTORETIC     TAKE these medications     Indication  amphetamine-dextroamphetamine 20 MG tablet Commonly known as:  ADDERALL Take 20 mg by mouth 3 (three) times daily.  Indication:  Attention Deficit Hyperactivity Disorder   disulfiram 250 MG tablet Commonly known as:  ANTABUSE Take 1 tablet (250 mg total) by mouth daily.  Indication:  Excessive Use of Alcohol   hydrochlorothiazide 25 MG tablet Commonly known as:  HYDRODIURIL Take 1 tablet (25 mg total) by mouth daily.  Indication:  High Blood Pressure Disorder   QUEtiapine 50 MG tablet Commonly known as:  SEROQUEL Take 1 tablet (50 mg total) by mouth at bedtime.  Indication:  Depressive Phase of  Manic-Depression      Follow-up Information    [facility]. Go on 02/15/2016.   Why:  Monday, Wednesday, Friday between 9-12pm for Substance Abuse intensive outpatient program. Please arrive for your first appointment on 02/15/16 at 7:30 am.                Contact information: [address] [place] [address] [phone]        [facility]. Go on 02/21/2016.   Why:  10:00am, Please reschedule if this doesn't work for you. Contact information: [address] [place] [phone] FAX          Follow-up recommendations:  Activity:  As tolerated. Diet:  Low sodium heart healthy. Other:  Keep follow-up appointments.  Comments:    Signed: Kristine Linea, MD 02/14/2016, 11:47 AM

## 2016-02-14 NOTE — Plan of Care (Signed)
Problem: [facility] Participation in Recreation Therapeutic Interventions Goal: STG-Patient will identify at least five coping skills for ** STG: Coping Skills - Within 4 treatment sessions, patient will verbalize at least 5 coping skills for substance abuse in each of 2 treatment sessions to decrease substance abuse post d/c.  Outcome: Completed/Met Date Met: 02/14/16 Treatment Session 2; Completed 2 out of 2: At approximately 11:50 am, LRT met with patient in patient room. Patient verbalized 5 coping skills for substance abuse. LRT encouraged patient to use her coping skills instead of turning to substances.  Leonette Monarch, LRT/CTRS 12.07.17 12:05 pm Goal: STG-Other Recreation Therapy Goal (Specify) STG: Stress Management - Within 4 treatment sessions, patient will verbalize understanding of the stress management techniques in each of 2 treatment sessions to increase stress management skills post d/c.  Outcome: Completed/Met Date Met: 02/14/16 Treatment Session 2; Completed 2 out of 2: At approximately 11:50 am, LRT met with patient in patient room. Patient reported she read over and practiced the stress management techniques. Patient verbalized understanding and reported the techniques were helpful. LRT encouraged patient to continue practicing the stress management techniques.  Leonette Monarch, LRT/CTRS  12.07.17 12:06 pm

## 2016-02-14 NOTE — Discharge Summary (Signed)
Physician Discharge Summary Note  Patient:  Marie Mckenzie is an 25 y.o., female MRN:  [id number] DOB:  10/24/1990 Patient phone:  [phone] (home)  Patient address:   [facility] [place] [address],  Total Time spent with patient: 30 minutes  Date of Admission:  02/10/2016 Date of Discharge: 02/14/2016  Reason for Admission:  Suicidal ideation.  Marie Mckenzie is a 25 year old female with history of ADHD, depression, and alcoholism admitted for suicidal threats while drunk.  1. Suicidal ideation. The patient is able to contract for safety in the hospital.  2. Alcohol detox. She was started on Librium taper.  3. Substance abuse treatment. The patient declines residential treatment but will follow up with SA IOP program at [facility].  4. Mood. The patient is not interested in pharmacotherapy for depression. She will follow up with her regular therapist.  5. Hypertension. She is on hydrochlorothiazide.  6. ADHD. [name] describes that 20 mg 3 times daily. We'll continue.  7. Smoking. Nicotine patch is available.  8. Insomnia. Trazodone is available.  9. UTI. We gave a dose of fosfomycin.  10. Disposition. She will be discharged back to home with her friends. She will follow up with [name] for medication management, Holistic therapist for psychotherapy, and SA IOP program at [facility] for substance use.  Principal Problem: Major depressive disorder, recurrent severe without psychotic features [facility]) Discharge Diagnoses: Patient Active Problem List   Diagnosis Date Noted  . Attention deficit hyperactivity disorder (ADHD) [F90.9] 02/11/2016  . Major depressive disorder, recurrent severe without psychotic features (HCC) [F33.2] 02/10/2016  . Suicidal ideation [R45.851] 02/10/2016  . Involuntary commitment [Z04.6] 02/10/2016  . Alcohol use disorder, severe, dependence (HCC) [F10.20] 02/10/2016  . Alcohol intoxication (HCC) [F10.929] 02/10/2016  . Substance induced mood disorder (HCC)  [F19.94] 02/10/2016  . Tobacco use disorder [F17.200] 02/10/2016    Past Medical History:  Past Medical History:  Diagnosis Date  . ADHD (attention deficit hyperactivity disorder)   . Hypertension     Past Surgical History:  Procedure Laterality Date  . TONSILLECTOMY    . WISDOM TOOTH EXTRACTION     Family History: History reviewed. No pertinent family history.  Social History:  History  Alcohol Use  . 9.6 - 18.0 oz/week  . 16 - 30 Shots of liquor per week    Comment: uknown amount of liquor tonight     History  Drug Use No    Social History   Social History  . Marital status: Single    Spouse name: N/A  . Number of children: N/A  . Years of education: N/A   Social History Main Topics  . Smoking status: Current Every Day Smoker    Packs/day: 1.00    Types: Cigarettes  . Smokeless tobacco: Never Used     Comment: Pt has order for a Nicotine Patch  . Alcohol use 9.6 - 18.0 oz/week    16 - 30 Shots of liquor per week     Comment: uknown amount of liquor tonight  . Drug use: No  . Sexual activity: Yes    Birth control/ protection: Condom   Other Topics Concern  . None   Social History Narrative  . None    Hospital Course:    Marie Mckenzie is a 25 year old female with history of ADHD, depression, and alcoholism admitted for suicidal threats while drunk.  1. Suicidal ideation. Resolved. The patient is able to contract for safety. She is forward thinking and optimistic about the future.  2. Alcohol detox. She completed Librium taper. Vital signs were stable.   3. Substance abuse treatment. The patient declines residential treatment but will [facility] up [facility]. She was not interested in pharmacotherapy for alcoholism.  4. Mood. The patient is not interested in pharmacotherapy for depression. She will follow up with her regular therapist.  5. Hypertension. She is on hydrochlorothiazide.  6. ADHD. Dr. Janeece RiggersSu prescribes that 20 mg 3 times  daily.   7. Smoking. Nicotine patch is available.  8. Insomnia. She dis not respond to Trazodone or Restoril. She slept better with Seroquel.   9. UTI. We gave a dose of fosfomycin.  10. Back pain. She is on ibuprofen.   11. Disposition. She was discharged back to home with friends. She will follow up with Dr. Janeece RiggersSu for medication management, Holistic therapist for psychotherapy, and [facility] substance use.  Physical Findings: AIMS:  , ,  ,  ,    CIWA:  CIWA-Ar Total: 2 COWS:     Musculoskeletal: Strength & Muscle Tone: within normal limits Gait & Station: normal Patient leans: N/A  Psychiatric Specialty Exam: Physical Exam  Nursing note and vitals reviewed.   Review of Systems  Psychiatric/Behavioral: Positive for substance abuse. The patient has insomnia.   All other systems reviewed and are negative.   Blood pressure 121/83, pulse 85, temperature 97.5 F (36.4 C), temperature source Oral, resp. rate 18, height 5\' 6"  (1.676 m), weight 74.8 kg (165 lb), last menstrual period 01/08/2016, SpO2 100 %.Body mass index is 26.63 kg/m.  General Appearance: Casual  Eye Contact:  Good  Speech:  Clear and Coherent  Volume:  Normal  Mood:  Anxious  Affect:  Blunt  Thought Process:  Goal Directed and Descriptions of Associations: Intact  Orientation:  Full (Time, Place, and Person)  Thought Content:  WDL  Suicidal Thoughts:  No  Homicidal Thoughts:  No  Memory:  Immediate;   Fair Recent;   Fair Remote;   Fair  Judgement:  Impaired  Insight:  Shallow  Psychomotor Activity:  Normal  Concentration:  Concentration: Fair and Attention Span: Fair  Recall:  Fis[employer]Fair  Fund of Knowledge:  Fair  Language:  Fair  Akathisia:  No  Handed:  Right  AIMS (if indicated):     Assets:  Communication Skills Desire for Improvement Financial Resources/Insurance Housing Physical Health Resilience Social Support  ADL's:  Intact  Cognition:  WNL  Sleep:  Number of Hours:  7.45     Have you used any form of tobacco in the last 30 days? (Cigarettes, Smokeless Tobacco, Cigars, and/or Pipes): No  Has this patient used any form of tobacco in the last 30 days? (Cigarettes, Smokeless Tobacco, Cigars, and/or Pipes) Yes, No  Blood Alcohol level:  Lab Results  Component Value Date   ETH 393 (HH) 02/10/2016    Metabolic Disorder Labs:  No results found for: HGBA1C, MPG No results found for: PROLACTIN No results found for: CHOL, TRIG, HDL, CHOLHDL, VLDL, LDLCALC  See Psychiatric Specialty Exam and Suicide Risk Assessment completed by Attending Physician prior to discharge.  Discharge destination:  Home  Is patient on multiple antipsychotic therapies at discharge:  No   Has Patient had three or more failed trials of antipsychotic monotherapy by history:  No  Recommended Plan for Multiple Antipsychotic Therapies: NA  Discharge Instructions    Diet - low sodium heart healthy    Complete by:  As directed  Increase activity slowly    Complete by:  As directed        Medication List    STOP taking these medications   amLODipine 10 MG tablet Commonly known as:  NORVASC   lisinopril-hydrochlorothiazide 20-25 MG tablet Commonly known as:  ZESTORETIC     TAKE these medications     Indication  amphetamine-dextroamphetamine 20 MG tablet Commonly known as:  ADDERALL Take 20 mg by mouth 3 (three) times daily.  Indication:  Attention Deficit Hyperactivity Disorder   hydrochlorothiazide 25 MG tablet Commonly known as:  HYDRODIURIL Take 1 tablet (25 mg total) by mouth daily.  Indication:  High Blood Pressure Disorder   QUEtiapine 50 MG tablet Commonly known as:  SEROQUEL Take 1 tablet (50 mg total) by mouth at bedtime.  Indication:  Depressive Phase of Manic-Depression      Follow-up Information    [facility]. Go to.   Why:  Monday, Wednesday, Friday between 9-12pm for Substance Abuse int                  Contact  information: [address] [place] [address] [phone]        [facility]. Go on 02/21/2016.   Why:  10:00am, Please reschedule if this doesn't work for you. Contact information: [address] [place] [phone] FAX          Follow-up recommendations:  Activity:  As tolerated. Diet:  Low sodium heart healthy. Other:  Keep follow-up appointments.  Comments:    Signed: Kristine LineaJolanta Isaiahs Chancy, MD 02/14/2016, 9:39 AM

## 2016-02-14 NOTE — Plan of Care (Signed)
Problem: Coping: Goal: Ability to cope will improve Outcome: Not Progressing Patient has not learned coping skills at this time [place] RN   

## 2016-02-14 NOTE — [facility] Suicide Risk Assessment (Signed)
[facility] INPATIENT:  Family/Significant Other Suicide Prevention Education  Suicide Prevention Education:  Education Completed;Phillip Whittemore(friend [phone]), has been identified by the patient as the family member/significant other with whom the patient will be residing, and identified as the person(s) who will aid the patient in the event of a mental health crisis (suicidal ideations/suicide attempt).  With written consent from the patient, the family member/significant other has been provided the following suicide prevention education, prior to the and/or following the discharge of the patient.  The suicide prevention education provided includes the following:  Suicide risk factors  Suicide prevention and interventions  National Suicide Hotline telephone number  [facility] assessment telephone number  [facility] Emergency Assistance 911  [facility] and/or Residential Mobile Crisis Unit telephone number  Request made of family/significant other to:  Remove weapons (e.g., guns, rifles, knives), all items previously/currently identified as safety concern.    Remove drugs/medications (over-the-counter, prescriptions, illicit drugs), all items previously/currently identified as a safety concern.  The family member/significant other verbalizes understanding of the suicide prevention education information provided.  The family member/significant other agrees to remove the items of safety concern listed above.  Geneva Pallas G. Garnette CzechSampson MSW, LCSWA 02/14/2016, 9:20 AM

## 2016-02-14 NOTE — Tx Team (Signed)
Interdisciplinary Treatment and Diagnostic Plan Update  02/14/2016 Time of Session: 10:30am Marie LeschesDanielle R Mckenzie MRN: [id number]  Principal Diagnosis: Major depressive disorder, recurrent severe without psychotic features [facility](HCC)  Secondary Diagnoses: Principal Problem:   Major depressive disorder, recurrent severe without psychotic features (HCC) Active Problems:   Attention deficit hyperactivity disorder (ADHD)   Current Medications:  Current Facility-Administered Medications  Medication Dose Route Frequency Provider Last Rate Last Dose  . acetaminophen (TYLENOL) tablet 650 mg  650 mg Oral Q6H PRN Shari ProwsJolanta B Osualdo Hansell, MD   650 mg at 02/13/16 0338  . alum & mag hydroxide-simeth (MAALOX/MYLANTA) 200-200-20 MG/5ML suspension 30 mL  30 mL Oral Q4H PRN Orville Widmann B Hena Ewalt, MD      . amphetamine-dextroamphetamine (ADDERALL) tablet 20 mg  20 mg Oral BID WC Emonie Espericueta B Mckenzie Bove, MD   20 mg at 02/14/16 0811  . hydrochlorothiazide (HYDRODIURIL) tablet 25 mg  25 mg Oral Daily Shari ProwsJolanta B Rinda Rollyson, MD   25 mg at 02/14/16 0811  . ibuprofen (ADVIL,MOTRIN) tablet 600 mg  600 mg Oral Q6H PRN Shari ProwsJolanta B Dniyah Grant, MD   600 mg at 02/14/16 0655  . magnesium hydroxide (MILK OF MAGNESIA) suspension 30 mL  30 mL Oral Daily PRN Sharine Cadle B Dorothyann Mourer, MD      . nicotine (NICODERM CQ - dosed in mg/24 hours) patch 21 mg  21 mg Transdermal Daily Anasophia Pecor B Hudsyn Champine, MD   21 mg at 02/14/16 0811  . QUEtiapine (SEROQUEL) tablet 50 mg  50 mg Oral QHS Shari ProwsJolanta B Jaielle Dlouhy, MD   50 mg at 02/13/16 2152  . temazepam (RESTORIL) capsule 15 mg  15 mg Oral QHS PRN Shari ProwsJolanta B Lamanda Rudder, MD   15 mg at 02/13/16 2152   PTA Medications: Prescriptions Prior to Admission  Medication Sig Dispense Refill Last Dose  . amLODipine (NORVASC) 10 MG tablet Take 1 tablet (10 mg total) by mouth daily. 30 tablet 0 02/09/2016 at Unknown time  . amphetamine-dextroamphetamine (ADDERALL) 20 MG tablet Take 20 mg by mouth 3 (three) times daily.    02/09/2016 at Unknown time  . lisinopril-hydrochlorothiazide (ZESTORETIC) 20-25 MG per tablet Take 1 tablet by mouth daily. 30 tablet 0 02/09/2016 at Unknown time    Patient Stressors: Financial difficulties Loss of father, best friend, and best friend's father Substance abuse  Patient Strengths: Ability for insight Average or above average intelligence Capable of independent living Barrister's clerkCommunication skills Motivation for treatment/growth Physical Health Supportive family/friends  Treatment Modalities: Medication Management, Group therapy, Case management,  1 to 1 session with clinician, Psychoeducation, Recreational therapy.   Physician Treatment Plan for Primary Diagnosis: Major depressive disorder, recurrent severe without psychotic features (HCC) Long Term Goal(s): Improvement in symptoms so as ready for discharge Improvement in symptoms so as ready for discharge   Short Term Goals: Ability to identify changes in lifestyle to reduce recurrence of condition will improve Ability to verbalize feelings will improve Ability to disclose and discuss suicidal ideas Ability to demonstrate self-control will improve Ability to identify and develop effective coping behaviors will improve Ability to maintain clinical measurements within normal limits will improve Ability to identify changes in lifestyle to reduce recurrence of condition will improve Ability to demonstrate self-control will improve Ability to identify triggers associated with substance abuse/mental health issues will improve  Medication Management: Evaluate patient's response, side effects, and tolerance of medication regimen.  Therapeutic Interventions: 1 to 1 sessions, Unit Group sessions and Medication administration.  Evaluation of Outcomes: Adequate for Discharge  Physician Treatment Plan for Secondary Diagnosis:  Principal Problem:   Major depressive disorder, recurrent severe without psychotic features (HCC) Active  Problems:   Attention deficit hyperactivity disorder (ADHD)  Long Term Goal(s): Improvement in symptoms so as ready for discharge Improvement in symptoms so as ready for discharge   Short Term Goals: Ability to identify changes in lifestyle to reduce recurrence of condition will improve Ability to verbalize feelings will improve Ability to disclose and discuss suicidal ideas Ability to demonstrate self-control will improve Ability to identify and develop effective coping behaviors will improve Ability to maintain clinical measurements within normal limits will improve Ability to identify changes in lifestyle to reduce recurrence of condition will improve Ability to demonstrate self-control will improve Ability to identify triggers associated with substance abuse/mental health issues will improve     Medication Management: Evaluate patient's response, side effects, and tolerance of medication regimen.  Therapeutic Interventions: 1 to 1 sessions, Unit Group sessions and Medication administration.  Evaluation of Outcomes: Adequate for Discharge   RN Treatment Plan for Primary Diagnosis: Major depressive disorder, recurrent severe without psychotic features (HCC) Long Term Goal(s): Knowledge of disease and therapeutic regimen to maintain health will improve  Short Term Goals:  Ability to remain free from injury will improve, Ability to verbalize frustration and anger appropriately will improve, Ability to participate in decision making will improve, Ability to identify and develop effective coping behaviors will improve and Compliance with prescribed medications will improve  Medication Management: RN will administer medications as ordered by provider, will assess and evaluate patient's response and provide education to patient for prescribed medication. RN will report any adverse and/or side effects to prescribing provider.  Therapeutic Interventions: 1 on 1 counseling sessions,  Psychoeducation, Medication administration, Evaluate responses to treatment, Monitor vital signs and CBGs as ordered, Perform/monitor CIWA, COWS, AIMS and Fall Risk screenings as ordered, Perform wound care treatments as ordered.  Evaluation of Outcomes: Adequate for Discharge   LCSW Treatment Plan for Primary Diagnosis: Major depressive disorder, recurrent severe without psychotic features (HCC) Long Term Goal(s): Safe transition to appropriate next level of care at discharge, Engage patient in therapeutic group addressing interpersonal concerns.  Short Term Goals: Engage patient in aftercare planning with referrals and resources, Increase social support, Increase ability to appropriately verbalize feelings, Increase emotional regulation, Facilitate acceptance of mental health diagnosis and concerns, Facilitate patient progression through stages of change regarding substance use diagnoses and concerns, Identify triggers associated with mental health/substance abuse issues and Increase skills for wellness and recovery  Therapeutic Interventions: Assess for all discharge needs, 1 to 1 time with Social worker, Explore available resources and support systems, Assess for adequacy in community support network, Educate family and significant other(s) on suicide prevention, Complete Psychosocial Assessment, Interpersonal group therapy.  Evaluation of Outcomes: Adequate for Discharge   Progress in Treatment: Attending groups: Yes. Participating in groups: Yes. Taking medication as prescribed: Yes. Toleration medication: Yes. Family/Significant other contact made: Yes, individual(s) contacted:  friend, Beacher May Patient understands diagnosis: Yes. Discussing patient identified problems/goals with staff: Yes. Medical problems stabilized or resolved: Yes. Denies suicidal/homicidal ideation: Yes. Issues/concerns per patient self-inventory: No. Other: n/a  New problem(s) identified: None  identified at this time.   New Short Term/Long Term Goal(s): None identified at this time.   Discharge Plan or Barriers: Patient will discharge to friend's house and follow-up with [facility] for outpatient services.   Reason for Continuation of Hospitalization: Anticipated discharge 02/14/2016  Estimated Length of Stay: Anticipated discharge 02/14/2016  Attendees: Patient:Marie Mckenzie  Jolayne PantherR Poznanski 02/14/2016 9:23 AM  Physician: Dr. Jennet MaduroPucilowska, MD 02/14/2016 9:23 AM  Nursing: Leonia ReaderPhyllis Cobb, RN 02/14/2016 9:23 AM  RN Care Manager: 02/14/2016 9:23 AM  Social Worker: Fredrich BirksAmaris G. Garnette CzechSampson MSW, LCSWA 02/14/2016 9:23 AM  Recreational Therapist: Jacquelynn CreeElizabeth M. Greene, LRT/CTRS 02/14/2016 9:23 AM  Other:  02/14/2016 9:23 AM  Other:  02/14/2016 9:23 AM  Other: 02/14/2016 9:23 AM    Scribe for Treatment Team: Arelia LongestAmaris G Sampson, LCSWA 02/14/2016 9:23 AM

## 2016-02-14 NOTE — Progress Notes (Signed)
  [facility] Discharge [facility] :  Will you be returning to the same living situation after discharge:  No., Patient will be living with a friend and his father At discharge, do you have transportation home?: Yes,  friend, Beacher Mayhillip Whittemore Do you have the ability to pay for your medications: Yes,  patient has financial support from friend  Release of information consent forms completed and in the chart;  Patient's signature needed at discharge.  Patient to Follow up at: Follow-up Information    [facility]. Go to.   Why:  Monday, Wednesday, Friday between 9-12pm for Substance Abuse int                  Contact information: [address] [place] [address] [phone]        [facility]. Go on 02/21/2016.   Why:  10:00am, Please reschedule if this doesn't work for you. Contact information: [address] [place] [phone] FAX          Next level of care provider has access to [facility] Link:no  Safety Planning and Suicide Prevention discussed: Yes,  with patient and friend  Have you used any form of tobacco in the last 30 days? (Cigarettes, Smokeless Tobacco, Cigars, and/or Pipes): No  Has patient been referred to the Quitline?: N/A patient is not a smoker  Patient has been referred for addiction treatment: Yes  Donovin Kraemer G. Garnette CzechSampson MSW, LCSWA 02/14/2016, 9:22 AM

## 2016-02-14 NOTE — Progress Notes (Signed)
D: Patient is alert and oriented on the unit this shift. Patient attended and   participated in groups today. Patient denies suicidal ideation, homicidal ideation, auditory or visual hallucinations at the present time.  A: Scheduled medications are administered to patient as per MD orders. Emotional support and encouragement are provided. Patient is maintained on q.15 minute safety checks. Patient is informed to notify staff with questions or concerns. R: No adverse medication reactions are noted. Patient is cooperative with medication administration and treatment plan today. Patient is receptive,  anxious and cooperative on the unit at this time. Patient interacts   with others on the unit this shift. Patient contracts for safety at this time. Patient remains safe at this time. Anxiety 4/1- Depression 4/10

## 2016-02-14 NOTE — Tx Team (Signed)
Interdisciplinary Treatment and Diagnostic Plan Update  02/14/2016 Time of Session: 10:30am Marie LeschesDanielle R Mckenzie MRN: [id number]  Principal Diagnosis: Major depressive disorder, recurrent severe without psychotic features [facility](HCC)  Secondary Diagnoses: Principal Problem:   Major depressive disorder, recurrent severe without psychotic features (HCC) Active Problems:   Attention deficit hyperactivity disorder (ADHD)   Current Medications:  Current Facility-Administered Medications  Medication Dose Route Frequency Provider Last Rate Last Dose  . acetaminophen (TYLENOL) tablet 650 mg  650 mg Oral Q6H PRN Shari ProwsJolanta B Edom Schmuhl, MD   650 mg at 02/13/16 0338  . alum & mag hydroxide-simeth (MAALOX/MYLANTA) 200-200-20 MG/5ML suspension 30 mL  30 mL Oral Q4H PRN Egbert Seidel B Kawena Lyday, MD      . amphetamine-dextroamphetamine (ADDERALL) tablet 20 mg  20 mg Oral BID WC Aydenn Gervin B Etana Beets, MD   20 mg at 02/14/16 0811  . disulfiram (ANTABUSE) tablet 250 mg  250 mg Oral Daily Temisha Murley B Suede Greenawalt, MD      . hydrochlorothiazide (HYDRODIURIL) tablet 25 mg  25 mg Oral Daily Shari ProwsJolanta B Aren Pryde, MD   25 mg at 02/14/16 0811  . ibuprofen (ADVIL,MOTRIN) tablet 600 mg  600 mg Oral Q6H PRN Shari ProwsJolanta B Lyndie Vanderloop, MD   600 mg at 02/14/16 0655  . magnesium hydroxide (MILK OF MAGNESIA) suspension 30 mL  30 mL Oral Daily PRN Loriene Taunton B Suhaila Troiano, MD      . nicotine (NICODERM CQ - dosed in mg/24 hours) patch 21 mg  21 mg Transdermal Daily Rhonin Trott B Juel Bellerose, MD   21 mg at 02/14/16 0811  . QUEtiapine (SEROQUEL) tablet 50 mg  50 mg Oral QHS Shari ProwsJolanta B Jaideep Pollack, MD   50 mg at 02/13/16 2152   PTA Medications: Prescriptions Prior to Admission  Medication Sig Dispense Refill Last Dose  . amLODipine (NORVASC) 10 MG tablet Take 1 tablet (10 mg total) by mouth daily. 30 tablet 0 02/09/2016 at Unknown time  . amphetamine-dextroamphetamine (ADDERALL) 20 MG tablet Take 20 mg by mouth 3 (three) times daily.   02/09/2016 at Unknown  time  . lisinopril-hydrochlorothiazide (ZESTORETIC) 20-25 MG per tablet Take 1 tablet by mouth daily. 30 tablet 0 02/09/2016 at Unknown time    Patient Stressors: Financial difficulties Loss of father, best friend, and best friend's father Substance abuse  Patient Strengths: Ability for insight Average or above average intelligence Capable of independent living Barrister's clerkCommunication skills Motivation for treatment/growth Physical Health Supportive family/friends  Treatment Modalities: Medication Management, Group therapy, Case management,  1 to 1 session with clinician, Psychoeducation, Recreational therapy.   Physician Treatment Plan for Primary Diagnosis: Major depressive disorder, recurrent severe without psychotic features (HCC) Long Term Goal(s): Improvement in symptoms so as ready for discharge Improvement in symptoms so as ready for discharge   Short Term Goals: Ability to identify changes in lifestyle to reduce recurrence of condition will improve Ability to verbalize feelings will improve Ability to disclose and discuss suicidal ideas Ability to demonstrate self-control will improve Ability to identify and develop effective coping behaviors will improve Ability to maintain clinical measurements within normal limits will improve Ability to identify changes in lifestyle to reduce recurrence of condition will improve Ability to demonstrate self-control will improve Ability to identify triggers associated with substance abuse/mental health issues will improve  Medication Management: Evaluate patient's response, side effects, and tolerance of medication regimen.  Therapeutic Interventions: 1 to 1 sessions, Unit Group sessions and Medication administration.  Evaluation of Outcomes: Adequate for Discharge  Physician Treatment Plan for Secondary Diagnosis: Principal Problem:  Major depressive disorder, recurrent severe without psychotic features (HCC) Active Problems:   Attention  deficit hyperactivity disorder (ADHD)  Long Term Goal(s): Improvement in symptoms so as ready for discharge Improvement in symptoms so as ready for discharge   Short Term Goals: Ability to identify changes in lifestyle to reduce recurrence of condition will improve Ability to verbalize feelings will improve Ability to disclose and discuss suicidal ideas Ability to demonstrate self-control will improve Ability to identify and develop effective coping behaviors will improve Ability to maintain clinical measurements within normal limits will improve Ability to identify changes in lifestyle to reduce recurrence of condition will improve Ability to demonstrate self-control will improve Ability to identify triggers associated with substance abuse/mental health issues will improve     Medication Management: Evaluate patient's response, side effects, and tolerance of medication regimen.  Therapeutic Interventions: 1 to 1 sessions, Unit Group sessions and Medication administration.  Evaluation of Outcomes: Adequate for Discharge   RN Treatment Plan for Primary Diagnosis: Major depressive disorder, recurrent severe without psychotic features (HCC) Long Term Goal(s): Knowledge of disease and therapeutic regimen to maintain health will improve  Short Term Goals: Ability to remain free from injury will improve, Ability to verbalize frustration and anger appropriately will improve, Ability to participate in decision making will improve, Ability to identify and develop effective coping behaviors will improve and Compliance with prescribed medications will improve  Medication Management: RN will administer medications as ordered by provider, will assess and evaluate patient's response and provide education to patient for prescribed medication. RN will report any adverse and/or side effects to prescribing provider.  Therapeutic Interventions: 1 on 1 counseling sessions, Psychoeducation, Medication  administration, Evaluate responses to treatment, Monitor vital signs and CBGs as ordered, Perform/monitor CIWA, COWS, AIMS and Fall Risk screenings as ordered, Perform wound care treatments as ordered.  Evaluation of Outcomes: Adequate for Discharge   LCSW Treatment Plan for Primary Diagnosis: Major depressive disorder, recurrent severe without psychotic features (HCC) Long Term Goal(s): Safe transition to appropriate next level of care at discharge, Engage patient in therapeutic group addressing interpersonal concerns.  Short Term Goals: Engage patient in aftercare planning with referrals and resources, Increase social support, Increase ability to appropriately verbalize feelings, Increase emotional regulation, Facilitate acceptance of mental health diagnosis and concerns, Facilitate patient progression through stages of change regarding substance use diagnoses and concerns, Identify triggers associated with mental health/substance abuse issues and Increase skills for wellness and recovery  Therapeutic Interventions: Assess for all discharge needs, 1 to 1 time with Social worker, Explore available resources and support systems, Assess for adequacy in community support network, Educate family and significant other(s) on suicide prevention, Complete Psychosocial Assessment, Interpersonal group therapy.  Evaluation of Outcomes: Adequate for Discharge   Progress in Treatment: Attending groups: Yes. Participating in groups: Yes. Taking medication as prescribed: Yes. Toleration medication: Yes. Family/Significant other contact made: Yes, individual(s) contacted:  friend, Beacher May Patient understands diagnosis: Yes. Discussing patient identified problems/goals with staff: Yes. Medical problems stabilized or resolved: Yes. Denies suicidal/homicidal ideation: Yes. Issues/concerns per patient self-inventory: No. Other: n/a  New problem(s) identified: None identified at this time.  New  Short Term/Long Term Goal(s): None identified at this time.   Discharge Plan or Barriers: Patient will discharge to live with her friend and his father and has follow-up with [facility] and [facility] substance abuse intensive outpatient program.   Reason for Continuation of Hospitalization: Anticipated discharge 02/14/2016  Estimated Length of Stay: Anticipated discharge 02/14/2016  Attendees: Patient: Marie Mckenzie 02/14/2016 12:03 PM  Physician: Dr. Jennet MaduroPucilowska, MD 02/14/2016 12:03 PM  Nursing: Leonia ReaderPhyllis Cobb, RN 02/14/2016 12:03 PM  RN Care Manager: 02/14/2016 12:03 PM  Social Worker: Fredrich BirksAmaris G. Garnette CzechSampson MSW, LCSWA 02/14/2016 12:03 PM  Recreational Therapist: Jacquelynn CreeElizabeth M. Greene, LRT/CTRS 02/14/2016 12:03 PM  Other:  02/14/2016 12:03 PM  Other:  02/14/2016 12:03 PM  Other: 02/14/2016 12:03 PM    Scribe for Treatment Team: Arelia LongestAmaris G Sampson, LCSWA 02/14/2016 12:06 PM

## 2016-02-14 NOTE — [facility] Group Notes (Signed)
[facility] LCSW Group Therapy   02/14/2016 9:30am   Type of Therapy: Group Therapy   Participation Level: Active   Participation Quality: Attentive, Sharing and Supportive   Affect: Appropriate   Cognitive: Alert and Oriented   Insight: Developing/Improving and Engaged   Engagement in Therapy: Developing/Improving and Engaged   Modes of Intervention: Clarification, Confrontation, Discussion, Education, Exploration, Limit-setting, Orientation, Problem-solving, Rapport Building, Dance movement psychotherapisteality Testing, Socialization and Support   Summary of Progress/Problems: The topic for group was balance in life. Today's group focused on defining balance in one's own words, identifying things that can knock one off balance, and exploring healthy ways to maintain balance in life. Group members were asked to provide an example of a time when they felt off balance, describe how they handled that situation, and process healthier ways to regain balance in the future. Group members were asked to share the most important tool for maintaining balance that they learned [facility] and how they plan to apply this method after discharge.  Pt defined balance as "weighing good and bad." She identified her work and school area as her unbalanced area of life. She stated in order to achieve balance, she will try to find work after discharge and stay compliant with medication.  Hampton AbbotKadijah Marquies Wanat, MSW, LCSWA 02/14/2016, 11:22AM

## 2016-02-14 NOTE — [facility] Suicide Risk Assessment (Signed)
[facility]   Principal Problem: Major depressive disorder, recurrent severe without psychotic features [facility](HCC) Discharge Diagnoses:  Patient Active Problem List   Diagnosis Date Noted  . Attention deficit hyperactivity disorder (ADHD) [F90.9] 02/11/2016  . Major depressive disorder, recurrent severe without psychotic features (HCC) [F33.2] 02/10/2016  . Suicidal ideation [R45.851] 02/10/2016  . Involuntary commitment [Z04.6] 02/10/2016  . Alcohol use disorder, severe, dependence (HCC) [F10.20] 02/10/2016  . Alcohol intoxication (HCC) [F10.929] 02/10/2016  . Substance induced mood disorder (HCC) [F19.94] 02/10/2016  . Tobacco use disorder [F17.200] 02/10/2016    Total Time spent with patient: 30 minutes  Musculoskeletal: Strength & Muscle Tone: within normal limits Gait & Station: normal Patient leans: N/A  Psychiatric Specialty Exam: Review of Systems  Psychiatric/Behavioral: Positive for substance abuse. The patient has insomnia.   All other systems reviewed and are negative.   Blood pressure 121/83, pulse 85, temperature 97.5 F (36.4 C), temperature source Oral, resp. rate 18, height 5\' 6"  (1.676 m), weight 74.8 kg (165 lb), last menstrual period 01/08/2016, SpO2 100 %.Body mass index is 26.63 kg/m.  General Appearance: Casual  Eye Contact::  Good  Speech:  Clear and Coherent409  Volume:  Normal  Mood:  Anxious  Affect:  Blunt  Thought Process:  Goal Directed and Descriptions of Associations: Intact  Orientation:  Full (Time, Place, and Person)  Thought Content:  WDL  Suicidal Thoughts:  No  Homicidal Thoughts:  No  Memory:  Immediate;   Fair Recent;   Fair Remote;   Fair  Judgement:  Impaired  Insight:  Shallow  Psychomotor Activity:  Normal  Concentration:  Fair  Recall:  FiservFair  Fund of Knowledge:Fair  Language: Fair  Akathisia:  No  Handed:  Right  AIMS (if indicated):     Assets:  Communication Skills Desire for  Improvement Financial Resources/Insurance Housing Physical Health Resilience Social Support Talents/Skills  Sleep:  Number of Hours: 7.45  Cognition: WNL  ADL's:  Intact   Mental Status Per Nursing Assessment::   On Admission:  NA  Demographic Factors:  Caucasian, Low socioeconomic status and Unemployed  Loss Factors: NA  Historical Factors: Impulsivity  Risk Reduction Factors:   Sense of responsibility to family, Living with another person, especially a relative, Positive social support and Positive therapeutic relationship  Continued Clinical Symptoms:  Depression:   Comorbid alcohol abuse/dependence Impulsivity Insomnia Alcohol/Substance Abuse/Dependencies  Cognitive Features That Contribute To Risk:  None    Suicide Risk:  Minimal: No identifiable suicidal ideation.  Patients presenting with no risk factors but with morbid ruminations; may be classified as minimal risk based on the severity of the depressive symptoms  Follow-up Information    [facility]. Go to.   Why:  Monday, Wednesday, Friday between 9-12pm for Substance Abuse int                  Contact information: [address] [place] [address] [phone]        [facility]. Go on 02/21/2016.   Why:  10:00am, Please reschedule if this doesn't work for you. Contact information: [address] [place] [phone] FAX          Plan Of Care/Follow-up recommendations:  Activity:  As tolerated. Diet:  Low sodium heart healthy. Other:  Keep follow-up appointments.  Kristine LineaJolanta Pucilowska, MD 02/14/2016, 9:11 AM

## 2016-02-14 NOTE — Progress Notes (Signed)
Provided and reviewed discharge paperwork and prescriptions. Verified understanding by use of teach back method. Verbalized understanding as well. Pt denies SI/HI/AVH and reports looking forward to getting back to a normal routine without alcohol use when discharged. Pt belongings returned as noted: from pt specific locker, meds from pharmacy. Pt to call ride for pickup as soon as possible.   Pt reports good sleep last night with help of sleep medication. Reports good appetite, normal energy, poor concentration. Rates depression 0/10, anxiety 1/10, hopelessness 0/10 (low 0-10 high). Denies SI/HI/AVH. Reports feeling positive about discharge. Goal today is "take medications and return to my normal life."  Support and encouragement provided with use of therapeutic communication. Safety maintained with every 15 minute checks. Medications administered as ordered with education. Will continue to monitor.

## 2016-02-14 NOTE — Progress Notes (Signed)
Recreation Therapy Notes  INPATIENT RECREATION TR PLAN  Patient Details Name: AVAIAH STEMPEL MRN: [id number] DOB: [birth year] Today's Date: 02/14/2016  Rec Therapy Plan Is patient appropriate for Therapeutic Recreation?: Yes Treatment times per week: At least once a week TR Treatment/Interventions: 1:1 session, Group participation (Comment) (Appropriate participation in daily recreational therapy tx)  Discharge Criteria Pt will be discharged from therapy if:: Treatment goals are met, Discharged Treatment plan/goals/alternatives discussed and agreed upon by:: Patient/family  Discharge Summary Short term goals set: See Care Plan Short term goals met: Complete Progress toward goals comments: One-to-one attended Which groups?: Self-esteem, Other (Comment), Wellness (Self-expression) One-to-one attended: Stress management, coping skills Reason goals not met: N/A Therapeutic equipment acquired: None Reason patient discharged from therapy: Discharge from hospital Pt/family agrees with progress & goals achieved: Yes Date patient discharged from therapy: 02/14/16   Leonette Monarch, LRT/CTRS 02/14/2016, 1:04 PM

## 2016-04-01 ENCOUNTER — Ambulatory Visit: Payer: Self-pay | Admitting: Pharmacy Technician

## 2016-04-01 NOTE — Progress Notes (Signed)
Patient scheduled for eligibility appointment at [facility].  Patient did not show for the appointment on 04/01/16 at 2:00p.m.  Patient did not reschedule eligibility appointment.  Sherilyn DacostaBetty J. Lorne Winkels Care Manager [facility]

## 2016-04-13 ENCOUNTER — Emergency Department: Payer: Self-pay

## 2016-04-13 ENCOUNTER — Emergency Department
Admission: EM | Admit: 2016-04-13 | Discharge: 2016-04-14 | Disposition: A | Discharge status: Home / Self Care | Payer: Self-pay | Attending: Emergency Medicine | Admitting: Emergency Medicine

## 2016-04-13 DIAGNOSIS — R079 Chest pain, unspecified: Secondary | ICD-10-CM | POA: Insufficient documentation

## 2016-04-13 DIAGNOSIS — R112 Nausea with vomiting, unspecified: Secondary | ICD-10-CM | POA: Insufficient documentation

## 2016-04-13 DIAGNOSIS — R42 Dizziness and giddiness: Secondary | ICD-10-CM | POA: Insufficient documentation

## 2016-04-13 DIAGNOSIS — R000 Tachycardia, unspecified: Secondary | ICD-10-CM | POA: Insufficient documentation

## 2016-04-13 DIAGNOSIS — Z79899 Other long term (current) drug therapy: Secondary | ICD-10-CM | POA: Insufficient documentation

## 2016-04-13 DIAGNOSIS — F909 Attention-deficit hyperactivity disorder, unspecified type: Secondary | ICD-10-CM | POA: Insufficient documentation

## 2016-04-13 DIAGNOSIS — F17210 Nicotine dependence, cigarettes, uncomplicated: Secondary | ICD-10-CM | POA: Insufficient documentation

## 2016-04-13 DIAGNOSIS — R0602 Shortness of breath: Secondary | ICD-10-CM | POA: Insufficient documentation

## 2016-04-13 DIAGNOSIS — I10 Essential (primary) hypertension: Secondary | ICD-10-CM | POA: Insufficient documentation

## 2016-04-13 LAB — COMPREHENSIVE METABOLIC PANEL
ALT: 22 U/L (ref 14–54)
AST: 45 U/L — ABNORMAL HIGH (ref 15–41)
Albumin: 5.1 g/dL — ABNORMAL HIGH (ref 3.5–5.0)
Alkaline Phosphatase: 60 U/L (ref 38–126)
Anion gap: 19 — ABNORMAL HIGH (ref 5–15)
BUN: 7 mg/dL (ref 6–20)
CHLORIDE: 99 mmol/L — AB (ref 101–111)
CO2: 19 mmol/L — AB (ref 22–32)
CREATININE: 0.85 mg/dL (ref 0.44–1.00)
Calcium: 9.5 mg/dL (ref 8.9–10.3)
GFR calc Af Amer: >60 mL/min (ref >60)
Glucose, Bld: 92 mg/dL (ref 65–99)
Potassium: 2.8 mmol/L — ABNORMAL LOW (ref 3.5–5.1)
Sodium: 137 mmol/L (ref 135–145)
Total Bilirubin: 1.2 mg/dL (ref 0.3–1.2)
Total Protein: 8.1 g/dL (ref 6.5–8.1)

## 2016-04-13 LAB — CBC
HCT: 40 % (ref 35.0–47.0)
Hemoglobin: 14 g/dL (ref 12.0–16.0)
MCH: 34.9 pg — ABNORMAL HIGH (ref 26.0–34.0)
MCHC: 34.9 g/dL (ref 32.0–36.0)
MCV: 99.9 fL (ref 80.0–100.0)
PLATELETS: 204 10*3/uL (ref 150–440)
RBC: 4 MIL/uL (ref 3.80–5.20)
RDW: 13.1 % (ref 11.5–14.5)
WBC: 7.9 10*3/uL (ref 3.6–11.0)

## 2016-04-13 LAB — LIPASE, BLOOD: Lipase: 20 U/L (ref 11–51)

## 2016-04-13 LAB — TROPONIN I

## 2016-04-13 NOTE — ED Triage Notes (Signed)
Pt states that approx 30 min ago she started having severe left sided chest pain that radiates into her back and left shoulder, pt states that she has a hx of high bp and is exhausted with her insomnia, pt is breathing fast in triage and encouraged to slow her breathing down, pt states that she feels like her chest is being crushed, heart rate elevated in triage at 131

## 2016-04-14 ENCOUNTER — Emergency Department: Payer: Self-pay

## 2016-04-14 ENCOUNTER — Encounter: Payer: Self-pay | Admitting: Radiology

## 2016-04-14 LAB — POCT PREGNANCY, URINE: Preg Test, Ur: NEGATIVE

## 2016-04-14 LAB — FIBRIN DERIVATIVES D-DIMER (ARMC ONLY): Fibrin derivatives D-dimer (ARMC): 458 (ref 0–499)

## 2016-04-14 LAB — TROPONIN I: Troponin I: <0.03 ng/mL (ref <0.03)

## 2016-04-14 MED ORDER — LORAZEPAM 0.5 MG PO TABS
ORAL_TABLET | ORAL | Status: AC
  Administered 2016-04-14: 0.5 mg via ORAL
  Filled 2016-04-14: qty 1

## 2016-04-14 MED ORDER — SODIUM CHLORIDE 0.9 % IV BOLUS (SEPSIS)
1000.0000 mL | Freq: Once | INTRAVENOUS | Status: AC
  Administered 2016-04-14: 1000 mL via INTRAVENOUS

## 2016-04-14 MED ORDER — MORPHINE SULFATE (PF) 4 MG/ML IV SOLN
4.0000 mg | Freq: Once | INTRAVENOUS | Status: AC
  Administered 2016-04-14: 4 mg via INTRAVENOUS

## 2016-04-14 MED ORDER — IOPAMIDOL (ISOVUE-370) INJECTION 76%
75.0000 mL | Freq: Once | INTRAVENOUS | Status: AC | PRN
  Administered 2016-04-14: 75 mL via INTRAVENOUS

## 2016-04-14 MED ORDER — ONDANSETRON HCL 4 MG/2ML IJ SOLN
4.0000 mg | Freq: Once | INTRAMUSCULAR | Status: AC
  Administered 2016-04-14: 4 mg via INTRAVENOUS

## 2016-04-14 MED ORDER — MORPHINE SULFATE (PF) 4 MG/ML IV SOLN
INTRAVENOUS | Status: AC
  Administered 2016-04-14: 4 mg via INTRAVENOUS
  Filled 2016-04-14: qty 1

## 2016-04-14 MED ORDER — LORAZEPAM 0.5 MG PO TABS
0.5000 mg | ORAL_TABLET | Freq: Once | ORAL | Status: AC
  Administered 2016-04-14: 0.5 mg via ORAL

## 2016-04-14 MED ORDER — NITROGLYCERIN 0.4 MG SL SUBL
0.4000 mg | SUBLINGUAL_TABLET | SUBLINGUAL | Status: DC | PRN
  Administered 2016-04-14: 0.4 mg via SUBLINGUAL
  Filled 2016-04-14: qty 1

## 2016-04-14 MED ORDER — ONDANSETRON HCL 4 MG/2ML IJ SOLN
INTRAMUSCULAR | Status: AC
  Administered 2016-04-14: 4 mg via INTRAVENOUS
  Filled 2016-04-14: qty 2

## 2016-04-14 MED ORDER — GI COCKTAIL ~~LOC~~
30.0000 mL | Freq: Once | ORAL | Status: AC
  Administered 2016-04-14: 30 mL via ORAL
  Filled 2016-04-14: qty 30

## 2016-04-14 MED ORDER — ASPIRIN 81 MG PO CHEW
324.0000 mg | CHEWABLE_TABLET | Freq: Once | ORAL | Status: AC
  Administered 2016-04-14: 324 mg via ORAL
  Filled 2016-04-14: qty 4

## 2016-04-14 MED ORDER — POTASSIUM CHLORIDE CRYS ER 20 MEQ PO TBCR
40.0000 meq | EXTENDED_RELEASE_TABLET | Freq: Once | ORAL | Status: AC
  Administered 2016-04-14: 40 meq via ORAL
  Filled 2016-04-14: qty 2

## 2016-04-14 NOTE — ED Notes (Signed)
Pt denies chest pain currently stating it is "more in my back now and it feels like there is a bubble in my stomach"

## 2016-04-14 NOTE — ED Notes (Signed)
Pt called out stating pain was worse. MD made aware.

## 2016-04-14 NOTE — ED Notes (Signed)
Pt discharged to home.  Significant other driving.  Discharge instructions reviewed.  Verbalized understanding.  No questions or concerns at this time.  Teach back verified.  Pt in NAD.  No items left in ED.   

## 2016-04-14 NOTE — ED Provider Notes (Signed)
[facility] Emergency Department Provider Note   ____________________________________________   First MD Initiated Contact with Patient 04/14/16 0013     (approximate)  I have reviewed the triage vital signs and the nursing notes.   HISTORY  Chief Complaint Chest Pain    HPI Marie Mckenzie is a 26 y.o. female who comes into the hospital today thinking that she's having a heart attack. The patient reports that her chest pain started around 11 PM. She had just gotten off work and was sitting down. The patient had vomited when she arrived home and the pain started at that point. The patient reports that her chest felt tight at work and she was having some shortness of breath with dizziness and lightheadedness. The patient feels that there is a ball in her chest and says it is worse when she takes a deep breath. The patient reports that she is uncomfortable when she's never had this before. The patient does have a history of panic attacks but has never been this bad. The patient rates her pain a 10 out of 10 in intensity. She didn't take anything for pain prior to coming into the hospital.   Past Medical History:  Diagnosis Date  . ADHD (attention deficit hyperactivity disorder)   . Hypertension     Patient Active Problem List   Diagnosis Date Noted  . Attention deficit hyperactivity disorder (ADHD) 02/11/2016  . Major depressive disorder, recurrent severe without psychotic features (HCC) 02/10/2016  . Suicidal ideation 02/10/2016  . Involuntary commitment 02/10/2016  . Alcohol use disorder, severe, dependence (HCC) 02/10/2016  . Alcohol intoxication (HCC) 02/10/2016  . Substance induced mood disorder (HCC) 02/10/2016  . Tobacco use disorder 02/10/2016    Past Surgical History:  Procedure Laterality Date  . TONSILLECTOMY    . WISDOM TOOTH EXTRACTION      Prior to Admission medications   Medication Sig Start Date End Date Taking? Authorizing  Provider  amphetamine-dextroamphetamine (ADDERALL) 20 MG tablet Take 20 mg by mouth 3 (three) times daily.    Historical Provider, MD  disulfiram (ANTABUSE) 250 MG tablet Take 1 tablet (250 mg total) by mouth daily. 02/14/16   Shari Prows, MD  hydrochlorothiazide (HYDRODIURIL) 25 MG tablet Take 1 tablet (25 mg total) by mouth daily. 02/14/16   Shari Prows, MD  QUEtiapine (SEROQUEL) 50 MG tablet Take 1 tablet (50 mg total) by mouth at bedtime. 02/14/16   Shari Prows, MD    Allergies Patient has no known allergies.  No family history on file.  Social History Social History  Substance Use Topics  . Smoking status: Current Every Day Smoker    Packs/day: 1.00    Types: Cigarettes  . Smokeless tobacco: Never Used     Comment: Pt has order for a Nicotine Patch  . Alcohol use 9.6 - 18.0 oz/week    16 - 30 Shots of liquor per week     Comment: uknown amount of liquor tonight    Review of Systems Constitutional: No fever/chills Eyes: No visual changes. ENT: No sore throat. Cardiovascular:  chest pain. Respiratory:  shortness of breath. Gastrointestinal: Nausea and vomiting with No abdominal pain.  No diarrhea.  No constipation. Genitourinary: Negative for dysuria. Musculoskeletal: Negative for back pain. Skin: Negative for rash. Neurological: Dizziness  10-point ROS otherwise negative.  ____________________________________________   PHYSICAL EXAM:  VITAL SIGNS: ED Triage Vitals  Enc Vitals Group     BP 04/13/16 2330 (!) 143/93  Pulse Rate 04/13/16 2330 (!) 118     Resp 04/13/16 2330 14     Temp 04/13/16 2330 98 F (36.7 C)     Temp Source 04/13/16 2330 Oral     SpO2 04/13/16 2330 100 %     Weight 04/13/16 2331 160 lb (72.6 kg)     Height 04/13/16 2331 5\' 7"  (1.702 m)     Head Circumference --      Peak Flow --      Pain Score 04/13/16 2332 10     Pain Loc --      Pain Edu? --      Excl. in GC? --     Constitutional: Alert and oriented.  Well appearing and in moderate distress. Eyes: Conjunctivae are normal. PERRL. EOMI. Head: Atraumatic. Nose: No congestion/rhinnorhea. Mouth/Throat: Mucous membranes are moist.  Oropharynx non-erythematous. Cardiovascular: tachycardia regular rhythm. Grossly normal heart sounds.  Good peripheral circulation. Respiratory: Normal respiratory effort.  No retractions. Lungs CTAB. Gastrointestinal: Soft and nontender. No distention. Positive bowel sounds Musculoskeletal: No lower extremity tenderness nor edema.   Neurologic:  Normal speech and language.  Skin:  Skin is warm, dry and intact. No rash noted. Psychiatric: Mood and affect are normal. Speech and behavior are normal.  ____________________________________________   LABS (all labs ordered are listed, but only abnormal results are displayed)  Labs Reviewed  CBC - Abnormal; Notable for the following:       Result Value   MCH 34.9 (*)    All other components within normal limits  COMPREHENSIVE METABOLIC PANEL - Abnormal; Notable for the following:    Potassium 2.8 (*)    Chloride 99 (*)    CO2 19 (*)    Albumin 5.1 (*)    AST 45 (*)    Anion gap 19 (*)    All other components within normal limits  TROPONIN I  LIPASE, BLOOD  FIBRIN DERIVATIVES D-DIMER ([facility] ONLY)  TROPONIN I  POC URINE PREG, ED  POCT PREGNANCY, URINE   ____________________________________________  EKG  ED ECG REPORT I, Rebecka ApleyWebster,  Shayna Eblen P, the attending physician, personally viewed and interpreted this ECG.   Date: 04/13/2016  EKG Time: 2323  Rate: 122  Rhythm: sinus tachycardia  Axis: normal  Intervals:none  ST&T Change: none  ____________________________________________  RADIOLOGY  CXR ____________________________________________   PROCEDURES  Procedure(s) performed: None  Procedures  Critical Care performed: No  ____________________________________________   INITIAL IMPRESSION / ASSESSMENT AND PLAN / ED COURSE  Pertinent  labs & imaging results that were available during my care of the patient were reviewed by me and considered in my medical decision making (see chart for details).  This is a 26 year old female who comes into the hospital today with chest pain. The patient reports that she vomited at home prior to this starting. The patient reports that she has had panic attacks in the past but this is much much worse. The patient is tachycardic here. I'll give her some aspirin as well as some nitroglycerin. I will also give the patient a GI cocktail and liter of normal saline. Her initial blood work is unremarkable but I will also send a d-dimer given the patient's pleuritic nature of her chest pain. I will reassess the patient once I received some of her results.  Clinical Course as of Apr 14 718  Mon Apr 14, 2016  0021 No active cardiopulmonary disease. DG Chest 2 View [AW]  [phone] No acute pulmonary embolism nor acute cardiopulmonary process. CT  Angio Chest PE W and/or Wo Contrast [AW]    Clinical Course User Index [AW] Rebecka Apley, MD   The patient's blood work and imaging is all unremarkable. The patient reports that her pain is improved although she still has some symptoms. Her tachycardia significantly improved. I will give the patient a dose of Ativan and she'll be discharged home to follow-up with her primary care physician. The patient did also receive a dose of potassium while in the emergency department.  ____________________________________________   FINAL CLINICAL IMPRESSION(S) / ED DIAGNOSES  Final diagnoses:  Chest pain, unspecified type  Tachycardia      NEW MEDICATIONS STARTED DURING THIS VISIT:  Discharge Medication List as of 04/14/2016  3:45 AM       Note:  This document was prepared using Dragon voice recognition software and may include unintentional dictation errors.    Rebecka Apley, MD 04/14/16 [phone]

## 2016-04-29 ENCOUNTER — Ambulatory Visit: Payer: Self-pay | Admitting: Pharmacy Technician

## 2016-04-29 NOTE — Progress Notes (Signed)
Patient scheduled for eligibility appointment at [facility].  Patient did not show for the appointment on 04/29/16 at 10:30a.m.  Patient did not reschedule eligibility appointment.  Called patient to reschedule.  Unable to speak to patient.  Left message.  [facility] will [facility] unable to provide ongoing medication assistance until eligibility is determined.  Sherilyn DacostaBetty J. Kluttz Care Manager  [facility]

## 2016-07-23 ENCOUNTER — Emergency Department: Payer: Self-pay

## 2016-07-23 ENCOUNTER — Emergency Department
Admission: EM | Admit: 2016-07-23 | Discharge: 2016-07-24 | Disposition: A | Discharge status: Rehabilitation Facility | Payer: Self-pay | Attending: Emergency Medicine | Admitting: Emergency Medicine

## 2016-07-23 DIAGNOSIS — F1020 Alcohol dependence, uncomplicated: Secondary | ICD-10-CM

## 2016-07-23 DIAGNOSIS — F10229 Alcohol dependence with intoxication, unspecified: Secondary | ICD-10-CM | POA: Insufficient documentation

## 2016-07-23 DIAGNOSIS — I10 Essential (primary) hypertension: Secondary | ICD-10-CM | POA: Insufficient documentation

## 2016-07-23 DIAGNOSIS — F909 Attention-deficit hyperactivity disorder, unspecified type: Secondary | ICD-10-CM | POA: Insufficient documentation

## 2016-07-23 DIAGNOSIS — M546 Pain in thoracic spine: Secondary | ICD-10-CM | POA: Insufficient documentation

## 2016-07-23 DIAGNOSIS — F17210 Nicotine dependence, cigarettes, uncomplicated: Secondary | ICD-10-CM | POA: Insufficient documentation

## 2016-07-23 LAB — URINALYSIS, COMPLETE (UACMP) WITH MICROSCOPIC
BILIRUBIN URINE: NEGATIVE
Glucose, UA: NEGATIVE mg/dL
Ketones, ur: NEGATIVE mg/dL
LEUKOCYTES UA: NEGATIVE
Nitrite: NEGATIVE
PROTEIN: 100 mg/dL — AB
SPECIFIC GRAVITY, URINE: 1.025 (ref 1.005–1.030)
pH: 5 (ref 5.0–8.0)

## 2016-07-23 LAB — URINE DRUG SCREEN, QUALITATIVE (ARMC ONLY)
Amphetamines, Ur Screen: POSITIVE — AB
BARBITURATES, UR SCREEN: NOT DETECTED
BENZODIAZEPINE, UR SCRN: NOT DETECTED
Cannabinoid 50 Ng, Ur ~~LOC~~: NOT DETECTED
Cocaine Metabolite,Ur ~~LOC~~: NOT DETECTED
MDMA (Ecstasy)Ur Screen: NOT DETECTED
METHADONE SCREEN, URINE: NOT DETECTED
OPIATE, UR SCREEN: NOT DETECTED
Phencyclidine (PCP) Ur S: NOT DETECTED
TRICYCLIC, UR SCREEN: NOT DETECTED

## 2016-07-23 LAB — COMPREHENSIVE METABOLIC PANEL
ALT: 256 U/L — ABNORMAL HIGH (ref 14–54)
AST: 854 U/L — AB (ref 15–41)
Albumin: 4.9 g/dL (ref 3.5–5.0)
Alkaline Phosphatase: 89 U/L (ref 38–126)
Anion gap: 13 (ref 5–15)
BILIRUBIN TOTAL: 1.6 mg/dL — AB (ref 0.3–1.2)
BUN: 8 mg/dL (ref 6–20)
CO2: 24 mmol/L (ref 22–32)
CREATININE: 0.43 mg/dL — AB (ref 0.44–1.00)
Calcium: 9 mg/dL (ref 8.9–10.3)
Chloride: 102 mmol/L (ref 101–111)
GFR calc Af Amer: >60 mL/min (ref >60)
Glucose, Bld: 102 mg/dL — ABNORMAL HIGH (ref 65–99)
Potassium: 3.7 mmol/L (ref 3.5–5.1)
Sodium: 139 mmol/L (ref 135–145)
Total Protein: 8.2 g/dL — ABNORMAL HIGH (ref 6.5–8.1)

## 2016-07-23 LAB — CBC
HCT: 40.7 % (ref 35.0–47.0)
Hemoglobin: 14 g/dL (ref 12.0–16.0)
MCH: 36.6 pg — ABNORMAL HIGH (ref 26.0–34.0)
MCHC: 34.4 g/dL (ref 32.0–36.0)
MCV: 106.3 fL — ABNORMAL HIGH (ref 80.0–100.0)
Platelets: 102 10*3/uL — ABNORMAL LOW (ref 150–440)
RBC: 3.83 MIL/uL (ref 3.80–5.20)
RDW: 14.1 % (ref 11.5–14.5)
WBC: 2.9 10*3/uL — ABNORMAL LOW (ref 3.6–11.0)

## 2016-07-23 LAB — ETHANOL: ALCOHOL ETHYL (B): 311 mg/dL — AB (ref <5)

## 2016-07-23 LAB — PROTIME-INR
INR: 1.02
Prothrombin Time: 13.4 seconds (ref 11.4–15.2)

## 2016-07-23 LAB — APTT: APTT: 30 s (ref 24–36)

## 2016-07-23 LAB — PREGNANCY, URINE: Preg Test, Ur: NEGATIVE

## 2016-07-23 NOTE — BH Assessment (Signed)
Per Rayfield Citizenaroline (Intake RN) with [facility], pt's BAC would need to decrease before she can be accepted for admission.
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
## 2016-07-23 NOTE — ED Notes (Signed)
Called lab for urinalysis add on, lab stated they needed more of sample.  Pt. Giving another sample to be sent.

## 2016-07-23 NOTE — ED Triage Notes (Signed)
Pt presents with c/o of wanting alcohol detox. Pt states she has noticed her health declining and wants help. Last drink consisted of 2 glasses wine, 2 shot of liquor and then vomited. Normal ETOH is a 5th and half a day. Pt has noted bruising on both arms. Pt states upper right back pain.
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
## 2016-07-24 ENCOUNTER — Inpatient Hospital Stay
Admission: EM | Admit: 2016-07-24 | Discharge: 2016-07-25 | DRG: 897 | Disposition: A | Discharge status: Home / Self Care | Payer: Self-pay | Attending: Internal Medicine | Admitting: Internal Medicine

## 2016-07-24 ENCOUNTER — Encounter: Payer: Self-pay | Admitting: Internal Medicine

## 2016-07-24 DIAGNOSIS — F17210 Nicotine dependence, cigarettes, uncomplicated: Secondary | ICD-10-CM | POA: Diagnosis present

## 2016-07-24 DIAGNOSIS — F10939 Alcohol use, unspecified with withdrawal, unspecified: Secondary | ICD-10-CM | POA: Diagnosis present

## 2016-07-24 DIAGNOSIS — D696 Thrombocytopenia, unspecified: Secondary | ICD-10-CM

## 2016-07-24 DIAGNOSIS — F329 Major depressive disorder, single episode, unspecified: Secondary | ICD-10-CM | POA: Diagnosis present

## 2016-07-24 DIAGNOSIS — R740 Nonspecific elevation of levels of transaminase and lactic acid dehydrogenase [LDH]: Secondary | ICD-10-CM

## 2016-07-24 DIAGNOSIS — D6959 Other secondary thrombocytopenia: Secondary | ICD-10-CM | POA: Diagnosis present

## 2016-07-24 DIAGNOSIS — F10931 Alcohol use, unspecified with withdrawal delirium: Secondary | ICD-10-CM

## 2016-07-24 DIAGNOSIS — F1020 Alcohol dependence, uncomplicated: Secondary | ICD-10-CM | POA: Diagnosis present

## 2016-07-24 DIAGNOSIS — F10239 Alcohol dependence with withdrawal, unspecified: Secondary | ICD-10-CM | POA: Diagnosis present

## 2016-07-24 DIAGNOSIS — I10 Essential (primary) hypertension: Secondary | ICD-10-CM | POA: Diagnosis present

## 2016-07-24 DIAGNOSIS — E876 Hypokalemia: Secondary | ICD-10-CM

## 2016-07-24 DIAGNOSIS — F10232 Alcohol dependence with withdrawal with perceptual disturbance: Secondary | ICD-10-CM

## 2016-07-24 DIAGNOSIS — F10932 Alcohol use, unspecified with withdrawal with perceptual disturbance: Secondary | ICD-10-CM

## 2016-07-24 DIAGNOSIS — F909 Attention-deficit hyperactivity disorder, unspecified type: Secondary | ICD-10-CM | POA: Diagnosis present

## 2016-07-24 DIAGNOSIS — F10231 Alcohol dependence with withdrawal delirium: Principal | ICD-10-CM | POA: Diagnosis present

## 2016-07-24 DIAGNOSIS — D72819 Decreased white blood cell count, unspecified: Secondary | ICD-10-CM

## 2016-07-24 DIAGNOSIS — R7401 Elevation of levels of liver transaminase levels: Secondary | ICD-10-CM

## 2016-07-24 LAB — CBC WITH DIFFERENTIAL/PLATELET
BASOS ABS: 0 10*3/uL (ref 0–0.1)
Basophils Relative: 1 %
Eosinophils Absolute: 0.1 10*3/uL (ref 0–0.7)
Eosinophils Relative: 3 %
HEMATOCRIT: 36.1 % (ref 35.0–47.0)
HEMOGLOBIN: 12.6 g/dL (ref 12.0–16.0)
LYMPHS PCT: 26 %
Lymphs Abs: 0.7 10*3/uL — ABNORMAL LOW (ref 1.0–3.6)
MCH: 36.2 pg — ABNORMAL HIGH (ref 26.0–34.0)
MCHC: 34.9 g/dL (ref 32.0–36.0)
MCV: 103.9 fL — AB (ref 80.0–100.0)
Monocytes Absolute: 0.4 10*3/uL (ref 0.2–0.9)
Monocytes Relative: 18 %
NEUTROS ABS: 1.3 10*3/uL — AB (ref 1.4–6.5)
Neutrophils Relative %: 52 %
Platelets: 81 10*3/uL — ABNORMAL LOW (ref 150–440)
RBC: 3.47 MIL/uL — AB (ref 3.80–5.20)
RDW: 13.8 % (ref 11.5–14.5)
WBC: 2.5 10*3/uL — AB (ref 3.6–11.0)

## 2016-07-24 LAB — COMPREHENSIVE METABOLIC PANEL
ALT: 183 U/L — ABNORMAL HIGH (ref 14–54)
AST: 402 U/L — AB (ref 15–41)
Albumin: 4.6 g/dL (ref 3.5–5.0)
Alkaline Phosphatase: 84 U/L (ref 38–126)
Anion gap: 8 (ref 5–15)
BUN: 12 mg/dL (ref 6–20)
CHLORIDE: 98 mmol/L — AB (ref 101–111)
CO2: 28 mmol/L (ref 22–32)
Calcium: 9.3 mg/dL (ref 8.9–10.3)
Creatinine, Ser: 0.65 mg/dL (ref 0.44–1.00)
Glucose, Bld: 97 mg/dL (ref 65–99)
POTASSIUM: 3.6 mmol/L (ref 3.5–5.1)
SODIUM: 134 mmol/L — AB (ref 135–145)
Total Bilirubin: 2.9 mg/dL — ABNORMAL HIGH (ref 0.3–1.2)
Total Protein: 7.7 g/dL (ref 6.5–8.1)

## 2016-07-24 LAB — URINALYSIS, COMPLETE (UACMP) WITH MICROSCOPIC
Bilirubin Urine: NEGATIVE
GLUCOSE, UA: NEGATIVE mg/dL
KETONES UR: NEGATIVE mg/dL
LEUKOCYTES UA: NEGATIVE
NITRITE: NEGATIVE
PROTEIN: 100 mg/dL — AB
Specific Gravity, Urine: 1.023 (ref 1.005–1.030)
pH: 8 (ref 5.0–8.0)

## 2016-07-24 LAB — ETHANOL: Alcohol, Ethyl (B): 161 mg/dL — ABNORMAL HIGH (ref <5)

## 2016-07-24 LAB — ACETAMINOPHEN LEVEL: Acetaminophen (Tylenol), Serum: <10 ug/mL — ABNORMAL LOW (ref 10–30)

## 2016-07-24 LAB — SALICYLATE LEVEL: Salicylate Lvl: <7 mg/dL (ref 2.8–30.0)

## 2016-07-24 MED ORDER — HEPARIN SODIUM (PORCINE) 5000 UNIT/ML IJ SOLN
5000.0000 [IU] | Freq: Three times a day (TID) | INTRAMUSCULAR | Status: DC
  Administered 2016-07-24: 21:00:00 5000 [IU] via SUBCUTANEOUS
  Filled 2016-07-24: qty 1

## 2016-07-24 MED ORDER — ADULT MULTIVITAMIN W/MINERALS CH
1.0000 | ORAL_TABLET | Freq: Every day | ORAL | Status: DC
  Administered 2016-07-25: 09:00:00 1 via ORAL
  Filled 2016-07-24: qty 1

## 2016-07-24 MED ORDER — HYDROCHLOROTHIAZIDE 25 MG PO TABS
25.0000 mg | ORAL_TABLET | Freq: Every day | ORAL | Status: DC
  Administered 2016-07-25: 09:00:00 25 mg via ORAL
  Filled 2016-07-24: qty 1

## 2016-07-24 MED ORDER — DOCUSATE SODIUM 100 MG PO CAPS: 100.0000 mg | ORAL_CAPSULE | Freq: Two times a day (BID) | ORAL | Status: DC | PRN

## 2016-07-24 MED ORDER — HYDROXYZINE HCL 25 MG PO TABS
50.0000 mg | ORAL_TABLET | Freq: Once | ORAL | Status: AC
  Administered 2016-07-24: 50 mg via ORAL
  Filled 2016-07-24: qty 2

## 2016-07-24 MED ORDER — LORAZEPAM 2 MG/ML IJ SOLN: 0.0000 mg | Freq: Four times a day (QID) | INTRAMUSCULAR | Status: DC

## 2016-07-24 MED ORDER — LORAZEPAM 2 MG/ML IJ SOLN: 0.0000 mg | Freq: Two times a day (BID) | INTRAMUSCULAR | Status: DC

## 2016-07-24 MED ORDER — LORAZEPAM 1 MG PO TABS
1.0000 mg | ORAL_TABLET | Freq: Once | ORAL | Status: AC
  Administered 2016-07-24: 1 mg via ORAL
  Filled 2016-07-24: qty 1

## 2016-07-24 MED ORDER — QUETIAPINE FUMARATE 25 MG PO TABS
50.0000 mg | ORAL_TABLET | Freq: Every day | ORAL | Status: DC
  Administered 2016-07-24: 21:00:00 50 mg via ORAL
  Filled 2016-07-24: qty 2

## 2016-07-24 MED ORDER — CHLORDIAZEPOXIDE HCL 25 MG PO CAPS
ORAL_CAPSULE | ORAL | Status: AC
  Filled 2016-07-24: qty 1

## 2016-07-24 MED ORDER — FOLIC ACID 1 MG PO TABS
1.0000 mg | ORAL_TABLET | Freq: Every day | ORAL | Status: DC
  Administered 2016-07-25: 09:00:00 1 mg via ORAL
  Filled 2016-07-24: qty 1

## 2016-07-24 MED ORDER — LORAZEPAM 1 MG PO TABS
1.0000 mg | ORAL_TABLET | ORAL | Status: DC | PRN
  Administered 2016-07-24 – 2016-07-25 (×3): 1 mg via ORAL
  Filled 2016-07-24 (×4): qty 1

## 2016-07-24 MED ORDER — PROMETHAZINE HCL 25 MG/ML IJ SOLN: 12.5000 mg | Freq: Four times a day (QID) | INTRAMUSCULAR | Status: DC | PRN

## 2016-07-24 MED ORDER — VITAMIN B-1 100 MG PO TABS: 100.0000 mg | ORAL_TABLET | Freq: Every day | ORAL | Status: DC

## 2016-07-24 MED ORDER — THIAMINE HCL 100 MG/ML IJ SOLN
100.0000 mg | Freq: Every day | INTRAMUSCULAR | Status: DC
  Filled 2016-07-24: qty 1

## 2016-07-24 MED ORDER — LORAZEPAM 2 MG/ML IJ SOLN: 2.0000 mg | INTRAMUSCULAR | Status: DC | PRN

## 2016-07-24 MED ORDER — CHLORDIAZEPOXIDE HCL 25 MG PO CAPS
50.0000 mg | ORAL_CAPSULE | Freq: Once | ORAL | Status: AC
  Administered 2016-07-24: 50 mg via ORAL
  Filled 2016-07-24: qty 2

## 2016-07-24 NOTE — Consult Note (Signed)
[place] [facility] Consult   Reason for Consult:  26 year old woman who was brought to the emergency room today from residential treatment services because of complications of alcohol withdrawal Referring Physician:  McShane Patient Identification: Marie Mckenzie MRN:  [id number] Principal Diagnosis: Alcohol withdrawal syndrome with perceptual disturbance [facility]) Diagnosis:   Patient Active Problem List   Diagnosis Date Noted  . Alcohol withdrawal syndrome with perceptual disturbance ([place]) [F10.232] 07/24/2016  . Attention deficit hyperactivity disorder (ADHD) [F90.9] 02/11/2016  . Major depressive disorder, recurrent severe without psychotic features ([place]) [F33.2] 02/10/2016  . Suicidal ideation [R45.851] 02/10/2016  . Involuntary commitment [Z04.6] 02/10/2016  . Alcohol use disorder, severe, dependence ([place]) [F10.20] 02/10/2016  . Alcohol intoxication ([place]) [F10.929] 02/10/2016  . Substance induced mood disorder ([place]) [F19.94] 02/10/2016  . Tobacco use disorder [F17.200] 02/10/2016    Total Time spent with patient: 45 minutes  Subjective:   Marie Mckenzie is a 26 y.o. female patient admitted with "I told them in our TS that I was seeing things".  HPI:  Patient interviewed. Chart reviewed. Labs reviewed. Spoke with emergency room physician. 26 year old woman presented to the emergency room yesterday with a blood alcohol level over 300. She was referred to residential treatment services. She reports that this morning she got up and told him that she was hearing voices and seeing things look like cats. Patient says she is still feeling tremulous all over. Has been feeling sick to her stomach and not been able to keep much food down today. A little unsteady on her feet. Patient reports that up until her presentation yesterday she had been consuming about a fifth of 80 proof liquor a day. Denies that she had been using any other drugs. Patient says her mood has been down and  negative but denies suicidal or homicidal thoughts. Sleep is poor appetite is poor. She had been taking medication for ADHD and Seroquel although she ran out of the Seroquel several days ago. Patient reports she has no history of withdrawal seizures.  Social history: Lives with her ex-boyfriend here in town. Not currently working.  Medical history: Denies having any ongoing medical problems other than ADHD and possibly high blood pressure  Substance abuse history: Long-standing alcohol abuse. No history of delirium tremens or seizures. Has been to rehabilitation in the past and has had some success with sobriety. Not using other drugs.  Past Psychiatric History: Patient has presented to hospitals for rehabilitation and detox before. No history of suicide attempts. Has gotten in fights while intoxicated in the past. Was prescribed Seroquel for depression and probably Adderall for ADHD. No history of suicide attempts.  Risk to Self: Is patient at risk for suicide?: No Risk to Others:   Prior Inpatient Therapy:   Prior Outpatient Therapy:    Past Medical History:  Past Medical History:  Diagnosis Date  . ADHD (attention deficit hyperactivity disorder)   . Hypertension     Past Surgical History:  Procedure Laterality Date  . TONSILLECTOMY    . WISDOM TOOTH EXTRACTION     Family History: No family history on file. Family Psychiatric  History: Positive for alcohol abuse Social History:  History  Alcohol Use  . 13.8 - 22.2 oz/week  . 16 - 30 Shots of liquor, 7 Cans of beer per week     History  Drug Use No    Social History   Social History  . Marital status: Single    Spouse name: N/A  . Number of  children: N/A  . Years of education: N/A   Social History Main Topics  . Smoking status: Current Every Day Smoker    Packs/day: 1.00    Types: Cigarettes  . Smokeless tobacco: Never Used     Comment: Pt has order for a Nicotine Patch  . Alcohol use 13.8 - 22.2 oz/week    16 -  30 Shots of liquor, 7 Cans of beer per week  . Drug use: No  . Sexual activity: Yes    Birth control/ protection: Condom   Other Topics Concern  . Not on file   Social History Narrative  . No narrative on file   Additional Social History:    Allergies:  No Known Allergies  Labs:  Results for orders placed or performed during the hospital encounter of 07/23/16 (from the past 48 hour(s))  Urine Drug Screen, Qualitative     Status: Abnormal   Collection Time: 07/23/16  7:16 PM  Result Value Ref Range   Tricyclic, Ur Screen NONE DETECTED NONE DETECTED   Amphetamines, Ur Screen POSITIVE (A) NONE DETECTED   MDMA (Ecstasy)Ur Screen NONE DETECTED NONE DETECTED   Cocaine Metabolite,Ur [place] NONE DETECTED NONE DETECTED   Opiate, Ur Screen NONE DETECTED NONE DETECTED   Phencyclidine (PCP) Ur S NONE DETECTED NONE DETECTED   Cannabinoid 50 Ng, Ur [place] NONE DETECTED NONE DETECTED   Barbiturates, Ur Screen NONE DETECTED NONE DETECTED   Benzodiazepine, Ur Scrn NONE DETECTED NONE DETECTED   Methadone Scn, Ur NONE DETECTED NONE DETECTED    Comment: (NOTE) 704  Tricyclics, urine               Cutoff 1000 ng/mL 200  Amphetamines, urine             Cutoff 1000 ng/mL 300  MDMA (Ecstasy), urine           Cutoff 500 ng/mL 400  Cocaine Metabolite, urine       Cutoff 300 ng/mL 500  Opiate, urine                   Cutoff 300 ng/mL 600  Phencyclidine (PCP), urine      Cutoff 25 ng/mL 700  Cannabinoid, urine              Cutoff 50 ng/mL 800  Barbiturates, urine             Cutoff 200 ng/mL 900  Benzodiazepine, urine           Cutoff 200 ng/mL 1000 Methadone, urine                Cutoff 300 ng/mL 1100 1200 The urine drug screen provides only a preliminary, unconfirmed 1300 analytical test result and should not be used for non-medical 1400 purposes. Clinical consideration and professional judgment should 1500 be applied to any positive drug screen result due to possible 1600 interfering substances. A more  specific alternate chemical method 1700 must be used in order to obtain a confirmed analytical result.  1800 Gas chromato graphy / mass spectrometry (GC/MS) is the preferred 1900 confirmatory method.   Pregnancy, urine     Status: None   Collection Time: 07/23/16  7:16 PM  Result Value Ref Range   Preg Test, Ur NEGATIVE NEGATIVE  Comprehensive metabolic panel     Status: Abnormal   Collection Time: 07/23/16  7:18 PM  Result Value Ref Range   Sodium 139 135 - 145 mmol/L   Potassium 3.7 3.5 -  5.1 mmol/L   Chloride 102 101 - 111 mmol/L   CO2 24 22 - 32 mmol/L   Glucose, Bld 102 (H) 65 - 99 mg/dL   BUN 8 6 - 20 mg/dL   Creatinine, Ser 0.43 (L) 0.44 - 1.00 mg/dL   Calcium 9.0 8.9 - 10.3 mg/dL   Total Protein 8.2 (H) 6.5 - 8.1 g/dL   Albumin 4.9 3.5 - 5.0 g/dL   AST 854 (H) 15 - 41 U/L   ALT 256 (H) 14 - 54 U/L   Alkaline Phosphatase 89 38 - 126 U/L   Total Bilirubin 1.6 (H) 0.3 - 1.2 mg/dL   GFR calc non Af Amer >60 >60 mL/min   GFR calc Af Amer >60 >60 mL/min    Comment: (NOTE) The eGFR has been calculated using the CKD EPI equation. This calculation has not been validated in all clinical situations. eGFR's persistently <60 mL/min signify possible Chronic Kidney Disease.    Anion gap 13 5 - 15  Ethanol     Status: Abnormal   Collection Time: 07/23/16  7:18 PM  Result Value Ref Range   Alcohol, Ethyl (B) 311 (HH) <5 mg/dL    Comment: CRITICAL RESULT CALLED TO, READ BACK BY AND VERIFIED WITH MATTHEW MARTIN AT 2004 ON 07/23/2016 JJB        LOWEST DETECTABLE LIMIT FOR SERUM ALCOHOL IS 5 mg/dL FOR MEDICAL PURPOSES ONLY   cbc     Status: Abnormal   Collection Time: 07/23/16  7:18 PM  Result Value Ref Range   WBC 2.9 (L) 3.6 - 11.0 K/uL   RBC 3.83 3.80 - 5.20 MIL/uL   Hemoglobin 14.0 12.0 - 16.0 g/dL   HCT 40.7 35.0 - 47.0 %   MCV 106.3 (H) 80.0 - 100.0 fL   MCH 36.6 (H) 26.0 - 34.0 pg   MCHC 34.4 32.0 - 36.0 g/dL   RDW 14.1 11.5 - 14.5 %   Platelets 102 (L) 150 - 440  K/uL  Protime-INR     Status: None   Collection Time: 07/23/16  8:01 PM  Result Value Ref Range   Prothrombin Time 13.4 11.4 - 15.2 seconds   INR 1.02   APTT     Status: None   Collection Time: 07/23/16  8:01 PM  Result Value Ref Range   aPTT 30 24 - 36 seconds  Urinalysis, Complete w Microscopic     Status: Abnormal   Collection Time: 07/23/16  8:01 PM  Result Value Ref Range   Color, Urine AMBER (A) YELLOW    Comment: BIOCHEMICALS MAY BE AFFECTED BY COLOR   APPearance CLEAR (A) CLEAR   Specific Gravity, Urine 1.025 1.005 - 1.030   pH 5.0 5.0 - 8.0   Glucose, UA NEGATIVE NEGATIVE mg/dL   Hgb urine dipstick LARGE (A) NEGATIVE   Bilirubin Urine NEGATIVE NEGATIVE   Ketones, ur NEGATIVE NEGATIVE mg/dL   Protein, ur 100 (A) NEGATIVE mg/dL   Nitrite NEGATIVE NEGATIVE   Leukocytes, UA NEGATIVE NEGATIVE   RBC / HPF 0-5 0 - 5 RBC/hpf   WBC, UA 0-5 0 - 5 WBC/hpf   Bacteria, UA RARE (A) NONE SEEN   Squamous Epithelial / LPF 0-5 (A) NONE SEEN   Mucous PRESENT   Ethanol     Status: Abnormal   Collection Time: 07/24/16 12:36 AM  Result Value Ref Range   Alcohol, Ethyl (B) 161 (H) <5 mg/dL    Comment:        LOWEST DETECTABLE LIMIT  FOR SERUM ALCOHOL IS 5 mg/dL FOR MEDICAL PURPOSES ONLY     Current Facility-Administered Medications  Medication Dose Route Frequency Provider Last Rate Last Dose  . chlordiazePOXIDE (LIBRIUM) capsule 50 mg  50 mg Oral Once Clapacs, John T, MD      . LORazepam (ATIVAN) injection 0-4 mg  0-4 mg Intravenous Q6H Schuyler Amor, MD       Followed by  . [START ON 07/26/2016] LORazepam (ATIVAN) injection 0-4 mg  0-4 mg Intravenous Q12H Schuyler Amor, MD      . promethazine (PHENERGAN) injection 12.5 mg  12.5 mg Intramuscular Q6H PRN Clapacs, Madie Reno, MD       Current Outpatient Prescriptions  Medication Sig Dispense Refill  . amphetamine-dextroamphetamine (ADDERALL) 20 MG tablet Take 20 mg by mouth 3 (three) times daily.    . QUEtiapine (SEROQUEL) 100 MG  tablet Take 50-100 mg by mouth at bedtime.  3  . disulfiram (ANTABUSE) 250 MG tablet Take 1 tablet (250 mg total) by mouth daily. (Patient not taking: Reported on 07/23/2016) 30 tablet 1  . hydrochlorothiazide (HYDRODIURIL) 25 MG tablet Take 1 tablet (25 mg total) by mouth daily. (Patient not taking: Reported on 07/23/2016) 30 tablet 1    Musculoskeletal: Strength & Muscle Tone: decreased Gait & Station: unsteady Patient leans: N/A  Psychiatric Specialty Exam: Physical Exam  Nursing note and vitals reviewed. Constitutional: She appears well-developed and well-nourished.  HENT:  Head: Normocephalic and atraumatic.  Eyes: Conjunctivae are normal. Pupils are equal, round, and reactive to light.  Neck: Normal range of motion.  Cardiovascular: Regular rhythm and normal heart sounds.   Respiratory: Effort normal. No respiratory distress.  GI: Soft.  Musculoskeletal: Normal range of motion.  Neurological: She is alert. She displays tremor.  Skin: Skin is warm and dry.  Psychiatric: Judgment normal. Her affect is blunt. Her speech is delayed. She is slowed. Thought content is not paranoid. Cognition and memory are impaired. She expresses no homicidal and no suicidal ideation.    Review of Systems  Constitutional: Negative.   HENT: Negative.   Eyes: Negative.   Respiratory: Negative.   Cardiovascular: Negative.   Gastrointestinal: Positive for abdominal pain and nausea.  Musculoskeletal: Negative.   Skin: Negative.   Neurological: Negative.   Psychiatric/Behavioral: Positive for depression, hallucinations, memory loss and substance abuse. Negative for suicidal ideas. The patient is nervous/anxious and has insomnia.     Blood pressure (!) 130/104, pulse (!) 101, temperature 97.8 F (36.6 C), temperature source Oral, resp. rate 18, height _0  (1.702 m), weight 73 kg (161 lb), last menstrual period 07/23/2016, SpO2 100 %.Body mass index is 25.22 kg/m.  General Appearance: Casual  Eye  Contact:  Fair  Speech:  Slow and Slurred  Volume:  Decreased  Mood:  Dysphoric  Affect:  Blunt  Thought Process:  Goal Directed  Orientation:  Full (Time, Place, and Person)  Thought Content:  Logical  Suicidal Thoughts:  No  Homicidal Thoughts:  No  Memory:  Immediate;   Fair Recent;   Poor Remote;   Good  Judgement:  Fair  Insight:  Fair  Psychomotor Activity:  Tremor  Concentration:  Concentration: Fair  Recall:  [employer] of Knowledge:  Fair  Language:  Fair  Akathisia:  No  Handed:  Right  AIMS (if indicated):     Assets:  Desire for Improvement Housing Physical Health Resilience  ADL's:  Intact  Cognition:  Impaired,  Mild  Sleep:  Treatment Plan Summary: Daily contact with patient to assess and evaluate symptoms and progress in treatment, Medication management and Plan 26 year old woman having alcohol withdrawal hallucinations. She is not delirious and doesn't appear to be having delirium tremens although she is at high risk for that. Blood alcohol level was still over 100 just after midnight this morning. That puts her still in the window where seizures or not impossible and DTs may be coming up. Patient does not meet criteria for psychiatric hospitalization. Case reviewed with emergency room physician. Time will tell probably whether she really needs inpatient hospital treatment or not. We discussed the possibility of referral to a op's bed. Patient had multiple lab abnormalities including liver function abnormalities and blood count abnormalities suggestive of serious damage from alcohol abuse. She is covered with bruises which she says are just the result of her heavy drinking not of any particular trauma. Patient will be given a 1 time dose of Librium by me now. I've put in orders for when necessary Phenergan if she is sick to her stomach and unable to eat or drink. She can either be referred to op source stay in the emergency room and we will continue to  evaluate her. Detox orders in place.  Disposition: Patient does not meet criteria for psychiatric inpatient admission. Supportive therapy provided about ongoing stressors. Discussed crisis plan, support from social network, calling 911, coming to the Emergency Department, and calling Suicide Hotline.  Alethia Berthold, MD 07/24/2016 5:17 PM

## 2016-07-24 NOTE — ED Notes (Signed)
Pt. Discharged to [facility] rep.

## 2016-07-24 NOTE — ED Notes (Signed)
Pt. States "I am feeling shaky".  Pt. Requesting something to help with her nerves.

## 2016-07-24 NOTE — ED Notes (Signed)

## 2016-07-24 NOTE — ED Notes (Signed)
Pt sent over from [facility] for further eval of hallucinating.

## 2016-07-24 NOTE — ED Provider Notes (Signed)
[facility] Emergency Department Provider Note  ____________________________________________   I have reviewed the triage vital signs and the nursing notes.   HISTORY  Chief Complaint Drug / Alcohol Assessment    HPI Marie Mckenzie is a 26 y.o. female  suffers from alcohol abuse. Patient went to rehabilitation facility and swelling that they found her to be hallucinating and sent her here. They did give her Ativan. Patient was somnolent here, she wakes and says it did not complaints except for she sometimes hears her mother talking with her mother is not there. She denies any SI or HI at this time.      Past Medical History:  Diagnosis Date  . ADHD (attention deficit hyperactivity disorder)   . Hypertension     Patient Active Problem List   Diagnosis Date Noted  . 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 Alcohol withdrawal (HCC) 07/24/2016  . Attention deficit hyperactivity disorder (ADHD) 02/11/2016  . Major depressive disorder, recurrent severe without psychotic features (HCC) 02/10/2016  . Suicidal ideation 02/10/2016  . Involuntary commitment 02/10/2016  . Alcohol use disorder, severe, dependence (HCC) 02/10/2016  . Alcohol intoxication (HCC) 02/10/2016  . Substance induced mood disorder (HCC) 02/10/2016  . Tobacco use disorder 02/10/2016    Past Surgical History:  Procedure Laterality Date  . TONSILLECTOMY    . WISDOM TOOTH EXTRACTION      Prior to Admission medications   Medication Sig Start Date End Date Taking? Authorizing Provider  amphetamine-dextroamphetamine (ADDERALL) 20 MG tablet Take 20 mg by mouth 3 (three) times daily.   Yes [provider]  QUEtiapine (SEROQUEL) 100 MG tablet Take 50-100 mg by mouth at bedtime. 05/12/16  Yes [provider]  disulfiram (ANTABUSE) 250 MG tablet Take 1 tablet (250 mg total) by mouth daily. Patient not taking: Reported on 07/23/2016 02/14/16    Pucilowska, Braulio Conte B, MD  hydrochlorothiazide (HYDRODIURIL) 25 MG tablet Take 1 tablet (25 mg total) by mouth daily. Patient not taking: Reported on 07/23/2016 02/14/16   Shari Prows, MD    Allergies Patient has no known allergies.  Family History  Problem Relation Age of Onset  . Hypertension Father     Social History Social History  Substance Use Topics  . Smoking status: Current Every Day Smoker    Packs/day: 1.00    Types: Cigarettes  . Smokeless tobacco: Never Used     Comment: Pt has order for a Nicotine Patch  . Alcohol use 13.8 - 22.2 oz/week    7 Cans of beer, 16 - 30 Shots of liquor per week    Review of Systems Constitutional: No fever/chills Eyes: No visual changes. ENT: No sore throat. No stiff neck no neck pain Cardiovascular: Denies chest pain. Respiratory: Denies shortness of breath. Gastrointestinal:   no vomiting.  No diarrhea.  No constipation. Genitourinary: Negative for dysuria. Musculoskeletal: Negative lower extremity swelling Skin: Negative for rash. Neurological: Negative for severe headaches, focal weakness or numbness. 10-point ROS otherwise negative.  ____________________________________________   PHYSICAL EXAM:  VITAL SIGNS: ED Triage Vitals  Enc Vitals Group     BP 07/24/16 1510 (!) 130/104     Pulse Rate 07/24/16 1510 (!) 101     Resp 07/24/16 1510 18     Temp 07/24/16 1510 97.8 F (36.6 C)     Temp Source 07/24/16 1510 Oral     SpO2 07/24/16 1510 100 %     Weight 07/24/16 1512 161 lb (73 kg)  Height 07/24/16 1512 5\' 7"  (1.702 m)     Head Circumference --      Peak Flow --      Pain Score 07/24/16 1549 0     Pain Loc --      Pain Edu? --      Excl. in GC? --     Constitutional: Alert and oriented. Well appearing and in no acute distress.She was somnolent initially but aroused. Eyes: Conjunctivae are normal. PERRL. EOMI. Head: Atraumatic. Nose: No congestion/rhinnorhea. Mouth/Throat: Mucous membranes are  moist.  Oropharynx non-erythematous. Neck: No stridor.   Nontender with no meningismus Cardiovascular: Normal rate, regular rhythm. Grossly normal heart sounds.  Good peripheral circulation. Respiratory: Normal respiratory effort.  No retractions. Lungs CTAB. Abdominal: Soft and nontender. No distention. No guarding no rebound Back:  There is no focal tenderness or step off.  there is no midline tenderness there are no lesions noted. there is no CVA tenderness Musculoskeletal: No lower extremity tenderness, no upper extremity tenderness. No joint effusions, no DVT signs strong distal pulses no edema Neurologic:  Normal speech and language. No gross focal neurologic deficits are appreciated.  Skin:  Skin is warm, dry and intact. No rash noted. Psychiatric: Mood and affect are normal. Speech and behavior are normal.  ____________________________________________   LABS (all labs ordered are listed, but only abnormal results are displayed)  Labs Reviewed  COMPREHENSIVE METABOLIC PANEL - Abnormal; Notable for the following:       Result Value   Sodium 134 (*)    Chloride 98 (*)    AST 402 (*)    ALT 183 (*)    Total Bilirubin 2.9 (*)    All other components within normal limits  CBC WITH DIFFERENTIAL/PLATELET - Abnormal; Notable for the following:    WBC 2.5 (*)    RBC 3.47 (*)    MCV 103.9 (*)    MCH 36.2 (*)    Platelets 81 (*)    Neutro Abs 1.3 (*)    Lymphs Abs 0.7 (*)    All other components within normal limits  ACETAMINOPHEN LEVEL - Abnormal; Notable for the following:    Acetaminophen (Tylenol), Serum <10 (*)    All other components within normal limits  URINALYSIS, COMPLETE (UACMP) WITH MICROSCOPIC - Abnormal; Notable for the following:    Color, Urine AMBER (*)    APPearance CLEAR (*)    Hgb urine dipstick MODERATE (*)    Protein, ur 100 (*)    Bacteria, UA RARE (*)    Squamous Epithelial / LPF 0-5 (*)    All other components within normal limits  URINE CULTURE   ETHANOL  SALICYLATE LEVEL  PREGNANCY, URINE   ____________________________________________  EKG  I personally interpreted any EKGs ordered by me or triage  ____________________________________________  RADIOLOGY  I reviewed any imaging ordered by me or triage that were performed during my shift and, if possible, patient and/or family made aware of any abnormal findings. ____________________________________________   PROCEDURES  Procedure(s) performed: None  Procedures  Critical Care performed: None  ____________________________________________   INITIAL IMPRESSION / ASSESSMENT AND PLAN / ED COURSE  Pertinent labs & imaging results that were available during my care of the patient were reviewed by me and considered in my medical decision making (see chart for details).  Patient is reporting audible hallucinations. She initially was mildly tachycardic. This likely is withdrawal. She did receive Ativan as an outpatient. At psychiatry evaluate her. They feel after 24 hours she likely  will be safe for discharge. We have admitted her for observation in the hospital as we have been unable to discharge her and she is not appropriate for 24 hours of ED observation.    ____________________________________________   FINAL CLINICAL IMPRESSION(S) / ED DIAGNOSES  Final diagnoses:  Alcohol withdrawal delirium (HCC)      This chart was dictated using voice recognition software.  Despite best efforts to proofread,  errors can occur which can change meaning.      Jeanmarie PlantMcShane, Blandon Offerdahl A, MD 07/24/16 [id number]

## 2016-07-24 NOTE — BH Assessment (Addendum)
[facility] has been [facility] at [facility].  Per [facility] [employer], Admission's RN, pt will be picked up at 6:30 am.

## 2016-07-24 NOTE — ED Triage Notes (Signed)
She arrives today from [facility]   She was discharge to them this am prior to 0700 for detox from ETOH  Staff there reports pt has had uncontrollable behavior including being up and down and not being able to relax

## 2016-07-24 NOTE — Discharge Instructions (Signed)
Proceed directly to [facility].  Return to the ER for worsening symptoms, persistent vomiting, difficulty breathing or other concerns. °

## 2016-07-24 NOTE — ED Provider Notes (Signed)
-----------------------------------------   4:54 AM on 07/24/2016 -----------------------------------------  Patient was accepted to [facility] after repeat ethanol level. [facility] is unable to take the patient until after 8:30 this morning.  ----------------------------------------- 6:59 AM on 07/24/2016 -----------------------------------------  [facility] was able to take patient earlier; patient was discharged with [facility] rep in stable condition. Strict return precautions given. Patient verbalized understanding and agreed with plan of care.   Irean HongSung, Jade J, MD 07/24/16 [phone]

## 2016-07-24 NOTE — ED Notes (Signed)
BEHAVIORAL HEALTH ROUNDING Patient sleeping: No. Patient alert and oriented: yes Behavior appropriate: Yes.  ; If no, describe:  Nutrition and fluids offered: yes Toileting and hygiene offered: Yes  Sitter present: q15 minute observations and security  monitoring Law enforcement present: Yes  ODS  

## 2016-07-24 NOTE — H&P (Addendum)
[facility] [place] at [facility]   PATIENT NAME: Marie Mckenzie    MR#:  [id number]  DATE OF BIRTH:  11-11-1990  DATE OF ADMISSION:  07/24/2016  PRIMARY CARE PHYSICIAN: Patient, No Pcp Per   REQUESTING/REFERRING PHYSICIAN: McShane  CHIEF COMPLAINT:   Chief Complaint  Patient presents with  . Drug / Alcohol Assessment    HISTORY OF PRESENT ILLNESS: Marie Mckenzie  is a 26 y.o. female with a known history of ADHD, depression, Hypertension, Alcohol abuse- stopped drinking yesterday and came to ER- sent to alcohol rehab, today from there sent to ER, as she is hallucinating and have tremers.  She is hearing her mother an seeing things which are not there.  PAST MEDICAL HISTORY:   Past Medical History:  Diagnosis Date  . ADHD (attention deficit hyperactivity disorder)   . Hypertension     PAST SURGICAL HISTORY: Past Surgical History:  Procedure Laterality Date  . TONSILLECTOMY    . WISDOM TOOTH EXTRACTION      SOCIAL HISTORY:  Social History  Substance Use Topics  . Smoking status: Current Every Day Smoker    Packs/day: 1.00    Types: Cigarettes  . Smokeless tobacco: Never Used     Comment: Pt has order for a Nicotine Patch  . Alcohol use 13.8 - 22.2 oz/week    7 Cans of beer, 16 - 30 Shots of liquor per week    FAMILY HISTORY:  Family History  Problem Relation Age of Onset  . Hypertension Father     DRUG ALLERGIES: No Known Allergies  REVIEW OF SYSTEMS:   CONSTITUTIONAL: No fever, fatigue or weakness.  EYES: No blurred or double vision.  EARS, NOSE, AND THROAT: No tinnitus or ear pain.  RESPIRATORY: No cough, shortness of breath, wheezing or hemoptysis.  CARDIOVASCULAR: No chest pain, orthopnea, edema.  GASTROINTESTINAL: No nausea, vomiting, diarrhea or abdominal pain.  GENITOURINARY: No dysuria, hematuria.  ENDOCRINE: No polyuria, nocturia,  HEMATOLOGY: No anemia, easy bruising or bleeding SKIN: No rash or lesion. MUSCULOSKELETAL:  No joint pain or arthritis.   NEUROLOGIC: No tingling, numbness, weakness.  PSYCHIATRY: No anxiety or depression.   MEDICATIONS AT HOME:  Prior to Admission medications   Medication Sig Start Date End Date Taking? Authorizing Provider  amphetamine-dextroamphetamine (ADDERALL) 20 MG tablet Take 20 mg by mouth 3 (three) times daily.   Yes [provider]  QUEtiapine (SEROQUEL) 100 MG tablet Take 50-100 mg by mouth at bedtime. 05/12/16  Yes [provider]  disulfiram (ANTABUSE) 250 MG tablet Take 1 tablet (250 mg total) by mouth daily. Patient not taking: Reported on 07/23/2016 02/14/16   Pucilowska, Braulio Conte B, MD  hydrochlorothiazide (HYDRODIURIL) 25 MG tablet Take 1 tablet (25 mg total) by mouth daily. Patient not taking: Reported on 07/23/2016 02/14/16   Pucilowska, Braulio Conte B, MD      PHYSICAL EXAMINATION:   VITAL SIGNS: Blood pressure (!) 130/104, pulse (!) 101, temperature 97.8 F (36.6 C), temperature source Oral, resp. rate 18, height 5\' 7"  (1.702 m), weight 73 kg (161 lb), last menstrual period 07/23/2016, SpO2 100 %.  GENERAL:  26 y.o.-year-old patient lying in the bed with no acute distress.  EYES: Pupils equal, round, reactive to light and accommodation. No scleral icterus. Extraocular muscles intact.  HEENT: Head atraumatic, normocephalic. Oropharynx and nasopharynx clear.  NECK:  Supple, no jugular venous distention. No thyroid enlargement, no tenderness.  LUNGS: Normal breath sounds bilaterally, no wheezing, rales,rhonchi or crepitation. No use of accessory muscles  of respiration.  CARDIOVASCULAR: S1, S2 normal. No murmurs, rubs, or gallops.  ABDOMEN: Soft, nontender, nondistended. Bowel sounds present. No organomegaly or mass.  EXTREMITIES: No pedal edema, cyanosis, or clubbing.  NEUROLOGIC: Cranial nerves II through XII are intact. Muscle strength 5/5 in all extremities. Sensation intact. Gait not checked. Have some tremers. PSYCHIATRIC: The patient is alert  and oriented x 3.  SKIN: No obvious rash, lesion, or ulcer.   LABORATORY PANEL:   CBC  Recent Labs Lab 07/23/16 1918 07/24/16 1710  WBC 2.9* 2.5*  HGB 14.0 12.6  HCT 40.7 36.1  PLT 102* 81*  MCV 106.3* 103.9*  MCH 36.6* 36.2*  MCHC 34.4 34.9  RDW 14.1 13.8  LYMPHSABS  --  0.7*  MONOABS  --  0.4  EOSABS  --  0.1  BASOSABS  --  0.0   ------------------------------------------------------------------------------------------------------------------  Chemistries   Recent Labs Lab 07/23/16 1918 07/24/16 1710  NA 139 134*  K 3.7 3.6  CL 102 98*  CO2 24 28  GLUCOSE 102* 97  BUN 8 12  CREATININE 0.43* 0.65  CALCIUM 9.0 9.3  AST 854* 402*  ALT 256* 183*  ALKPHOS 89 84  BILITOT 1.6* 2.9*   ------------------------------------------------------------------------------------------------------------------ estimated creatinine clearance is 102.7 mL/min (by C-G formula based on SCr of 0.65 mg/dL). ------------------------------------------------------------------------------------------------------------------ No results for input(s): TSH, T4TOTAL, T3FREE, THYROIDAB in the last 72 hours.  Invalid input(s): FREET3   Coagulation profile  Recent Labs Lab 07/23/16 2001  INR 1.02   ------------------------------------------------------------------------------------------------------------------- No results for input(s): DDIMER in the last 72 hours. -------------------------------------------------------------------------------------------------------------------  Cardiac Enzymes No results for input(s): CKMB, TROPONINI, MYOGLOBIN in the last 168 hours.  Invalid input(s): CK ------------------------------------------------------------------------------------------------------------------ Invalid input(s): POCBNP  ---------------------------------------------------------------------------------------------------------------  Urinalysis    Component Value Date/Time    COLORURINE AMBER (A) 07/24/2016 1710   APPEARANCEUR CLEAR (A) 07/24/2016 1710   APPEARANCEUR Clear 03/16/2014 0950   LABSPEC 1.023 07/24/2016 1710   LABSPEC 1.011 03/16/2014 0950   PHURINE 8.0 07/24/2016 1710   GLUCOSEU NEGATIVE 07/24/2016 1710   GLUCOSEU Negative 03/16/2014 0950   HGBUR MODERATE (A) 07/24/2016 1710   BILIRUBINUR NEGATIVE 07/24/2016 1710   BILIRUBINUR Negative 03/16/2014 0950   KETONESUR NEGATIVE 07/24/2016 1710   PROTEINUR 100 (A) 07/24/2016 1710   NITRITE NEGATIVE 07/24/2016 1710   LEUKOCYTESUR NEGATIVE 07/24/2016 1710   LEUKOCYTESUR Negative 03/16/2014 0950     RADIOLOGY: Dg Ribs Unilateral W/chest Right  Result Date: 07/23/2016 CLINICAL DATA:  Right chest and right rib pain. EXAM: RIGHT RIBS AND CHEST - 3+ VIEW COMPARISON:  04/13/2016 and prior radiographs FINDINGS: The cardiomediastinal silhouette is unremarkable. There is no evidence of focal airspace disease, pulmonary edema, suspicious pulmonary nodule/mass, pleural effusion, or pneumothorax. No acute bony abnormalities are identified. No rib abnormality or acute rib fracture identified. IMPRESSION: Negative. Electronically Signed   By: Harmon PierJeffrey  Hu M.D.   On: 07/23/2016 20:37    EKG: Orders placed or performed during the hospital encounter of 04/13/16  . EKG 12-Lead  . EKG 12-Lead  . ED EKG  . ED EKG  . EKG    IMPRESSION AND PLAN:  * Alcohol withdrawal   Will keep on CIWA protocol.   Psych consult, may go to rehab once stable.  * Hypertension   Keep on HCTZ.  * Smoking    Councelled to Quit smoking for 4 min, nicotine patch.  * Thrombocytopenia    Monitor, may be due to alcoholism  * UA positive for few WBCs   Nitrate negative  [facility] end Ur cx, monitor for now.  All the records are reviewed and case discussed with ED provider. Management plans discussed with the patient, family and they are in agreement.  CODE STATUS: full.    Code Status Orders        Start     Ordered    07/24/16 1707  Full code  Continuous     07/24/16 1706    Code Status History    Date Active Date Inactive Code Status Order ID Comments User Context   02/11/2016 12:24 AM 02/14/2016  3:45 PM Full Code [id number]  Shari Prows, MD Inpatient       TOTAL TIME TAKING CARE OF THIS PATIENT: 45 minutes.    Altamese Dilling M.D on 07/24/2016   Between 7am to 6pm - Pager - [phone]  After 6pm go to [email or web address] - password [email or web address]  [facility] [place] [facility]  Office  [phone]  CC: Primary care physician; Patient, No Pcp Per   Note: This dictation was prepared with Dragon dictation along with smaller phrase technology. Any transcriptional errors that result from this process are unintentional.

## 2016-07-24 NOTE — ED Notes (Signed)
Pt is laying on bed resting peacefully with eyes closed

## 2016-07-25 DIAGNOSIS — E876 Hypokalemia: Secondary | ICD-10-CM

## 2016-07-25 DIAGNOSIS — D72819 Decreased white blood cell count, unspecified: Secondary | ICD-10-CM

## 2016-07-25 DIAGNOSIS — R740 Nonspecific elevation of levels of transaminase and lactic acid dehydrogenase [LDH]: Secondary | ICD-10-CM

## 2016-07-25 DIAGNOSIS — R7401 Elevation of levels of liver transaminase levels: Secondary | ICD-10-CM

## 2016-07-25 DIAGNOSIS — D696 Thrombocytopenia, unspecified: Secondary | ICD-10-CM

## 2016-07-25 LAB — BASIC METABOLIC PANEL
ANION GAP: 9 (ref 5–15)
BUN: 10 mg/dL (ref 6–20)
CO2: 28 mmol/L (ref 22–32)
Calcium: 9.4 mg/dL (ref 8.9–10.3)
Chloride: 99 mmol/L — ABNORMAL LOW (ref 101–111)
Creatinine, Ser: 0.62 mg/dL (ref 0.44–1.00)
GLUCOSE: 88 mg/dL (ref 65–99)
Potassium: 3.4 mmol/L — ABNORMAL LOW (ref 3.5–5.1)
Sodium: 136 mmol/L (ref 135–145)

## 2016-07-25 LAB — CBC
HCT: 36.3 % (ref 35.0–47.0)
HEMOGLOBIN: 12.4 g/dL (ref 12.0–16.0)
MCH: 35.9 pg — ABNORMAL HIGH (ref 26.0–34.0)
MCHC: 34.3 g/dL (ref 32.0–36.0)
MCV: 104.6 fL — ABNORMAL HIGH (ref 80.0–100.0)
Platelets: 72 10*3/uL — ABNORMAL LOW (ref 150–440)
RBC: 3.47 MIL/uL — AB (ref 3.80–5.20)
RDW: 13.7 % (ref 11.5–14.5)
WBC: 2.3 10*3/uL — ABNORMAL LOW (ref 3.6–11.0)

## 2016-07-25 LAB — MAGNESIUM: MAGNESIUM: 1.7 mg/dL (ref 1.7–2.4)

## 2016-07-25 MED ORDER — POTASSIUM CHLORIDE CRYS ER 20 MEQ PO TBCR
40.0000 meq | EXTENDED_RELEASE_TABLET | Freq: Once | ORAL | Status: AC
  Administered 2016-07-25: 40 meq via ORAL
  Filled 2016-07-25: qty 2

## 2016-07-25 MED ORDER — FOLIC ACID 1 MG PO TABS: 1.0000 mg | ORAL_TABLET | Freq: Every day | ORAL | 3 refills | Status: DC

## 2016-07-25 MED ORDER — THIAMINE HCL 250 MG PO TABS: 250.0000 mg | ORAL_TABLET | Freq: Every day | ORAL | 3 refills | Status: DC

## 2016-07-25 MED ORDER — NICOTINE 21 MG/24HR TD PT24
21.0000 mg | MEDICATED_PATCH | Freq: Every day | TRANSDERMAL | Status: DC
  Filled 2016-07-25: qty 1

## 2016-07-25 MED ORDER — NICOTINE 21 MG/24HR TD PT24: 21.0000 mg | MEDICATED_PATCH | Freq: Every day | TRANSDERMAL | 0 refills | Status: DC

## 2016-07-25 MED ORDER — THIAMINE HCL 100 MG/ML IJ SOLN
100.0000 mg | Freq: Every day | INTRAMUSCULAR | Status: DC
  Filled 2016-07-25: qty 1

## 2016-07-25 MED ORDER — VITAMIN B-1 100 MG PO TABS
250.0000 mg | ORAL_TABLET | Freq: Every day | ORAL | Status: DC
  Administered 2016-07-25: 09:00:00 250 mg via ORAL
  Filled 2016-07-25: qty 1
  Filled 2016-07-25: qty 2

## 2016-07-25 MED ORDER — MAGNESIUM SULFATE 2 GM/50ML IV SOLN
2.0000 g | Freq: Once | INTRAVENOUS | Status: AC
  Administered 2016-07-25: 11:00:00 2 g via INTRAVENOUS
  Filled 2016-07-25: qty 50

## 2016-07-25 NOTE — Consult Note (Signed)
[place] Psychiatry Consult   Reason for Consult:  Follow-up consult for 26 year old woman who was admitted through the emergency room for alcohol withdrawal Referring Physician:  Posey Pronto Patient Identification: Marie Mckenzie MRN:  [id number] Principal Diagnosis: Alcohol withdrawal syndrome with perceptual disturbance [facility]) Diagnosis:   Patient Active Problem List   Diagnosis Date Noted  . Hypokalemia [E87.6] 07/25/2016  . Elevated transaminase level [R74.0] 07/25/2016  . Leukopenia [D72.819] 07/25/2016  . Thrombocytopenia ([place]) [D69.6] 07/25/2016  . Alcohol withdrawal syndrome with perceptual disturbance ([place]) [F10.232] 07/24/2016  . Alcohol withdrawal ([place]) [F10.239] 07/24/2016  . Attention deficit hyperactivity disorder (ADHD) [F90.9] 02/11/2016  . Major depressive disorder, recurrent severe without psychotic features ([place]) [F33.2] 02/10/2016  . Suicidal ideation [R45.851] 02/10/2016  . Involuntary commitment [Z04.6] 02/10/2016  . Alcohol use disorder, severe, dependence ([place]) [F10.20] 02/10/2016  . Alcohol intoxication ([place]) [F10.929] 02/10/2016  . Substance induced mood disorder ([place]) [F19.94] 02/10/2016  . Tobacco use disorder [F17.200] 02/10/2016    Total Time spent with patient: 30 minutes  Subjective:   Marie Mckenzie is a 26 y.o. female patient admitted with "I'm feeling better".  HPI:  This is a follow-up on my note from yesterday. This 26 year old woman presented in the emergency room yesterday referred from residential treatment services because she was having some complications in her alcohol withdrawal. I had suggested yesterday that she was a little too unstable to go right back to residential treatment services and needed a little bit more observation. Overnight the patient has settled down. She has not had a seizure. She tells me today that she is having no auditory or visual hallucinations. Her mood is not feeling frightened or anxious. On interview  there is no sign of delirium. She is able to get up and walk around without unsteadiness although she still has a bit of tremor. She is showing an normal appetite and has not been throwing up. Affect and mood are appropriate.  Past Psychiatric History: Patient has a past history of alcohol abuse and detox. Doesn't have a significant past history of other mental health issues that are directly related to substance abuse  Risk to Self: Is patient at risk for suicide?: No Risk to Others:   Prior Inpatient Therapy:   Prior Outpatient Therapy:    Past Medical History:  Past Medical History:  Diagnosis Date  . ADHD (attention deficit hyperactivity disorder)   . Hypertension     Past Surgical History:  Procedure Laterality Date  . TONSILLECTOMY    . WISDOM TOOTH EXTRACTION     Family History:  Family History  Problem Relation Age of Onset  . Hypertension Father    Family Psychiatric  History: Positive for substance abuse Social History:  History  Alcohol Use  . 13.8 - 22.2 oz/week  . 7 Cans of beer, 16 - 30 Shots of liquor per week     History  Drug Use No    Social History   Social History  . Marital status: Single    Spouse name: N/A  . Number of children: N/A  . Years of education: N/A   Social History Main Topics  . Smoking status: Current Every Day Smoker    Packs/day: 1.00    Types: Cigarettes  . Smokeless tobacco: Never Used     Comment: Pt has order for a Nicotine Patch  . Alcohol use 13.8 - 22.2 oz/week    7 Cans of beer, 16 - 30 Shots of liquor per week  .  Drug use: No  . Sexual activity: Yes    Birth control/ protection: Condom   Other Topics Concern  . None   Social History Narrative  . None   Additional Social History:    Allergies:  No Known Allergies  Labs:  Results for orders placed or performed during the hospital encounter of 07/24/16 (from the past 48 hour(s))  Comprehensive metabolic panel     Status: Abnormal   Collection Time:  07/24/16  5:10 PM  Result Value Ref Range   Sodium 134 (L) 135 - 145 mmol/L   Potassium 3.6 3.5 - 5.1 mmol/L   Chloride 98 (L) 101 - 111 mmol/L   CO2 28 22 - 32 mmol/L   Glucose, Bld 97 65 - 99 mg/dL   BUN 12 6 - 20 mg/dL   Creatinine, Ser 0.65 0.44 - 1.00 mg/dL   Calcium 9.3 8.9 - 10.3 mg/dL   Total Protein 7.7 6.5 - 8.1 g/dL   Albumin 4.6 3.5 - 5.0 g/dL   AST 402 (H) 15 - 41 U/L   ALT 183 (H) 14 - 54 U/L   Alkaline Phosphatase 84 38 - 126 U/L   Total Bilirubin 2.9 (H) 0.3 - 1.2 mg/dL   GFR calc non Af Amer >60 >60 mL/min   GFR calc Af Amer >60 >60 mL/min    Comment: (NOTE) The eGFR has been calculated using the CKD EPI equation. This calculation has not been validated in all clinical situations. eGFR's persistently <60 mL/min signify possible Chronic Kidney Disease.    Anion gap 8 5 - 15  Ethanol     Status: None   Collection Time: 07/24/16  5:10 PM  Result Value Ref Range   Alcohol, Ethyl (B) <5 <5 mg/dL    Comment:        LOWEST DETECTABLE LIMIT FOR SERUM ALCOHOL IS 5 mg/dL FOR MEDICAL PURPOSES ONLY   CBC with Differential     Status: Abnormal   Collection Time: 07/24/16  5:10 PM  Result Value Ref Range   WBC 2.5 (L) 3.6 - 11.0 K/uL   RBC 3.47 (L) 3.80 - 5.20 MIL/uL   Hemoglobin 12.6 12.0 - 16.0 g/dL   HCT 36.1 35.0 - 47.0 %   MCV 103.9 (H) 80.0 - 100.0 fL   MCH 36.2 (H) 26.0 - 34.0 pg   MCHC 34.9 32.0 - 36.0 g/dL   RDW 13.8 11.5 - 14.5 %   Platelets 81 (L) 150 - 440 K/uL   Neutrophils Relative % 52 %   Neutro Abs 1.3 (L) 1.4 - 6.5 K/uL   Lymphocytes Relative 26 %   Lymphs Abs 0.7 (L) 1.0 - 3.6 K/uL   Monocytes Relative 18 %   Monocytes Absolute 0.4 0.2 - 0.9 K/uL   Eosinophils Relative 3 %   Eosinophils Absolute 0.1 0 - 0.7 K/uL   Basophils Relative 1 %   Basophils Absolute 0.0 0 - 0.1 K/uL  Salicylate level     Status: None   Collection Time: 07/24/16  5:10 PM  Result Value Ref Range   Salicylate Lvl <3.3 2.8 - 30.0 mg/dL  Acetaminophen level      Status: Abnormal   Collection Time: 07/24/16  5:10 PM  Result Value Ref Range   Acetaminophen (Tylenol), Serum <10 (L) 10 - 30 ug/mL    Comment:        THERAPEUTIC CONCENTRATIONS VARY SIGNIFICANTLY. A RANGE OF 10-30 ug/mL MAY BE AN EFFECTIVE CONCENTRATION FOR MANY PATIENTS. HOWEVER, SOME ARE  BEST TREATED AT CONCENTRATIONS OUTSIDE THIS RANGE. ACETAMINOPHEN CONCENTRATIONS >150 ug/mL AT 4 HOURS AFTER INGESTION AND >50 ug/mL AT 12 HOURS AFTER INGESTION ARE OFTEN ASSOCIATED WITH TOXIC REACTIONS.   Urinalysis, Complete w Microscopic     Status: Abnormal   Collection Time: 07/24/16  5:10 PM  Result Value Ref Range   Color, Urine AMBER (A) YELLOW    Comment: BIOCHEMICALS MAY BE AFFECTED BY COLOR   APPearance CLEAR (A) CLEAR   Specific Gravity, Urine 1.023 1.005 - 1.030   pH 8.0 5.0 - 8.0   Glucose, UA NEGATIVE NEGATIVE mg/dL   Hgb urine dipstick MODERATE (A) NEGATIVE   Bilirubin Urine NEGATIVE NEGATIVE   Ketones, ur NEGATIVE NEGATIVE mg/dL   Protein, ur 100 (A) NEGATIVE mg/dL   Nitrite NEGATIVE NEGATIVE   Leukocytes, UA NEGATIVE NEGATIVE   RBC / HPF TOO NUMEROUS TO COUNT 0 - 5 RBC/hpf   WBC, UA 6-30 0 - 5 WBC/hpf   Bacteria, UA RARE (A) NONE SEEN   Squamous Epithelial / LPF 0-5 (A) NONE SEEN   Mucous PRESENT   Basic metabolic panel     Status: Abnormal   Collection Time: 07/25/16  4:50 AM  Result Value Ref Range   Sodium 136 135 - 145 mmol/L   Potassium 3.4 (L) 3.5 - 5.1 mmol/L   Chloride 99 (L) 101 - 111 mmol/L   CO2 28 22 - 32 mmol/L   Glucose, Bld 88 65 - 99 mg/dL   BUN 10 6 - 20 mg/dL   Creatinine, Ser 0.62 0.44 - 1.00 mg/dL   Calcium 9.4 8.9 - 10.3 mg/dL   GFR calc non Af Amer >60 >60 mL/min   GFR calc Af Amer >60 >60 mL/min    Comment: (NOTE) The eGFR has been calculated using the CKD EPI equation. This calculation has not been validated in all clinical situations. eGFR's persistently <60 mL/min signify possible Chronic Kidney Disease.    Anion gap 9 5 - 15   CBC     Status: Abnormal   Collection Time: 07/25/16  4:50 AM  Result Value Ref Range   WBC 2.3 (L) 3.6 - 11.0 K/uL   RBC 3.47 (L) 3.80 - 5.20 MIL/uL   Hemoglobin 12.4 12.0 - 16.0 g/dL   HCT 36.3 35.0 - 47.0 %   MCV 104.6 (H) 80.0 - 100.0 fL   MCH 35.9 (H) 26.0 - 34.0 pg   MCHC 34.3 32.0 - 36.0 g/dL   RDW 13.7 11.5 - 14.5 %   Platelets 72 (L) 150 - 440 K/uL  Magnesium     Status: None   Collection Time: 07/25/16  4:50 AM  Result Value Ref Range   Magnesium 1.7 1.7 - 2.4 mg/dL    Current Facility-Administered Medications  Medication Dose Route Frequency Provider Last Rate Last Dose  . docusate sodium (COLACE) capsule 100 mg  100 mg Oral BID PRN Vaughan Basta, MD      . folic acid (FOLVITE) tablet 1 mg  1 mg Oral Daily Vaughan Basta, MD   1 mg at 07/25/16 0848  . heparin injection 5,000 Units  5,000 Units Subcutaneous Q8H Vaughan Basta, MD   5,000 Units at 07/24/16 2057  . hydrochlorothiazide (HYDRODIURIL) tablet 25 mg  25 mg Oral Daily Vaughan Basta, MD   25 mg at 07/25/16 0847  . LORazepam (ATIVAN) injection 0-4 mg  0-4 mg Intravenous Q6H Schuyler Amor, MD       Followed by  . [START ON 07/26/2016] LORazepam (  ATIVAN) injection 0-4 mg  0-4 mg Intravenous Q12H Schuyler Amor, MD      . LORazepam (ATIVAN) tablet 1 mg  1 mg Oral Q2H PRN Vaughan Basta, MD   1 mg at 07/25/16 1336   Or  . LORazepam (ATIVAN) injection 2 mg  2 mg Intravenous Q2H PRN Vaughan Basta, MD      . multivitamin with minerals tablet 1 tablet  1 tablet Oral Daily Vaughan Basta, MD   1 tablet at 07/25/16 0848  . nicotine (NICODERM CQ - dosed in mg/24 hours) patch 21 mg  21 mg Transdermal Daily [place], Rima, MD      . promethazine (PHENERGAN) injection 12.5 mg  12.5 mg Intramuscular Q6H PRN Clapacs, John T, MD      . QUEtiapine (SEROQUEL) tablet 50 mg  50 mg Oral QHS Vaughan Basta, MD   50 mg at 07/24/16 2057  . thiamine (VITAMIN B-1) tablet  250 mg  250 mg Oral Daily Theodoro Grist, MD   250 mg at 07/25/16 0848   Or  . thiamine (B-1) injection 100 mg  100 mg Intravenous Daily Theodoro Grist, MD       Current Outpatient Prescriptions  Medication Sig Dispense Refill  . amphetamine-dextroamphetamine (ADDERALL) 20 MG tablet Take 20 mg by mouth 3 (three) times daily.    . QUEtiapine (SEROQUEL) 100 MG tablet Take 50-100 mg by mouth at bedtime.  3  . disulfiram (ANTABUSE) 250 MG tablet Take 1 tablet (250 mg total) by mouth daily. (Patient not taking: Reported on 07/23/2016) 30 tablet 1  . [START ON 03/15/2692] folic acid (FOLVITE) 1 MG tablet Take 1 tablet (1 mg total) by mouth daily. 30 tablet 3  . nicotine (NICODERM CQ - DOSED IN MG/24 HOURS) 21 mg/24hr patch Place 1 patch (21 mg total) onto the skin daily. 28 patch 0  . [START ON 07/26/2016] thiamine 250 MG tablet Take 1 tablet (250 mg total) by mouth daily. 30 tablet 3    Musculoskeletal: Strength & Muscle Tone: within normal limits Gait & Station: normal Patient leans: N/A  Psychiatric Specialty Exam: Physical Exam  Nursing note and vitals reviewed. Constitutional: She appears well-developed and well-nourished.  HENT:  Head: Normocephalic and atraumatic.  Eyes: Conjunctivae are normal. Pupils are equal, round, and reactive to light.  Neck: Normal range of motion.  Cardiovascular: Regular rhythm and normal heart sounds.   Respiratory: Effort normal. No respiratory distress.  GI: Soft.  Musculoskeletal: Normal range of motion.  Neurological: She is alert.  Skin: Skin is warm and dry.  Psychiatric: Judgment normal. Her affect is blunt. Her speech is delayed. She is slowed. Thought content is not paranoid and not delusional. Cognition and memory are normal. She expresses no homicidal and no suicidal ideation.    Review of Systems  Constitutional: Negative.   HENT: Negative.   Eyes: Negative.   Respiratory: Negative.   Cardiovascular: Negative.   Gastrointestinal:  Negative.   Musculoskeletal: Negative.   Skin: Negative.   Neurological: Positive for tremors.  Psychiatric/Behavioral: Positive for substance abuse. Negative for depression, hallucinations, memory loss and suicidal ideas. The patient is not nervous/anxious and does not have insomnia.     Blood pressure 129/87, pulse 87, temperature 98.5 F (36.9 C), resp. rate 20, height 5' 7"  (1.702 m), weight 73.5 kg (162 lb 1.6 oz), last menstrual period 07/23/2016, SpO2 99 %.Body mass index is 25.39 kg/m.  General Appearance: Fairly Groomed  Eye Contact:  Good  Speech:  Clear and Coherent  Volume:  Normal  Mood:  Euthymic  Affect:  Appropriate  Thought Process:  Goal Directed  Orientation:  Full (Time, Place, and Person)  Thought Content:  Logical  Suicidal Thoughts:  No  Homicidal Thoughts:  No  Memory:  Immediate;   Fair Recent;   Fair Remote;   Fair  Judgement:  Fair  Insight:  Fair  Psychomotor Activity:  Normal  Concentration:  Concentration: Fair  Recall:  [employer] of Knowledge:  Fair  Language:  Fair  Akathisia:  No  Handed:  Right  AIMS (if indicated):     Assets:  Communication Skills Desire for Improvement Housing Resilience  ADL's:  Intact  Cognition:  WNL  Sleep:        Treatment Plan Summary: Plan Patient was looking significantly better today when I came by to visit her. There is no sign of impending delirium tremens. Nevertheless I educated the patient that the time course of her drinking would suggest that she still could be at risk for this. I strongly encouraged the patient to accept the plan of going back to our [facility] to complete detox. Educated her that failure to do this put her at increased risk of relapse in the immediate future. Outlined for her some of the medical problems revealed by her labs that confirmed the damage she is doing to her self by her heavy drinking. Patient does not meet commitment criteria. Does not need any other specific psychiatric  medicine. No other need for intervention at this point. She expresses understanding of the education and is able to make decisions for herself.  Disposition: Patient does not meet criteria for psychiatric inpatient admission. Supportive therapy provided about ongoing stressors. Discussed crisis plan, support from social network, calling 911, coming to the Emergency Department, and calling Suicide Hotline.  Alethia Berthold, MD 07/25/2016 4:07 PM

## 2016-07-25 NOTE — Care Management (Signed)
No discharge needs identified by members of the care team. DC scripts for over the counter medications

## 2016-07-25 NOTE — Discharge Summary (Addendum)
[facility] - [place] [facility]   PATIENT NAME: Marie Mckenzie    MR#:  [id number]  DATE OF BIRTH:  [birth year]  DATE OF ADMISSION:  07/24/2016 ADMITTING PHYSICIAN: Altamese Dilling, MD  DATE OF DISCHARGE: No discharge date for patient encounter.  PRIMARY CARE PHYSICIAN: Patient, No Pcp Per     ADMISSION DIAGNOSIS:  Alcohol withdrawal delirium (HCC) [F10.231]  DISCHARGE DIAGNOSIS:  Principal Problem:   Alcohol withdrawal syndrome with perceptual disturbance (HCC) Active Problems:   Alcohol use disorder, severe, dependence (HCC)   Alcohol withdrawal (HCC)   Hypokalemia   Elevated transaminase level   Leukopenia   Thrombocytopenia (HCC)   SECONDARY DIAGNOSIS:   Past Medical History:  Diagnosis Date  . ADHD (attention deficit hyperactivity disorder)   . Hypertension     .pro HOSPITAL COURSE:   The patient is a 26 year old Caucasian female with past medical history is significant for history of alcohol dependence, ADHD, depression, hypertension, who stopped drinking 07/23/2016, who presented to the emergency room the same day, and was sent to alcohol rehabilitation facility, was sent back to emergency room due to hallucinations and some tremors. She was seeing things which were not there. Patient was seen by psychiatrist and admitted to the hospital. She was not felt to be undergoing delirium tremens, but alcohol withdrawal hallucinations. Patient was given Librium orally, Phenergan, admitted to the hospital for further observation. Initial labs revealed a marked elevation of LFTs, AST and ALT, mild bilirubinemia, repeated labs showed improvement of liver enzymes. Labs also showed the Darvocet. Thrombocytopenia also leukopenia but no significant anemia. Patient's symptoms subsided and she was felt to be stable to be discharged to residential treatment facility today. She is being discharged with thiamine and folic acid, also non-nicotine  patch, she was recommended to quit smoking. Discussion by problem: #1. Alcohol withdrawal hallucinations, resolved with supportive therapy, patient is stable to be discharged to residential treatment facility for alcohol withdrawal treatment, she is to continue thiamine, folic acid #2. Elevated transaminases, total. Bilirubin, improving. Labs without alcohol. Follow-up as outpatient #3. Hypokalemia, supplement orally, magnesium was checked, supplemented intravenously #4. Leukopenia, . Thrombocytopenia, likely due to alcohol abuse, follow labs as outpatient #5. Tobacco abuse. Counseling, discussed this patient for 4 minutes, nicotine replacement therapy is initiated and to be continued DISCHARGE CONDITIONS:   Stable  CONSULTS OBTAINED:  Treatment Team:  Clapacs, Jackquline Denmark, MD  DRUG ALLERGIES:  No Known Allergies  DISCHARGE MEDICATIONS:   Current Discharge Medication List    START taking these medications   Details  folic acid (FOLVITE) 1 MG tablet Take 1 tablet (1 mg total) by mouth daily. Qty: 30 tablet, Refills: 3    nicotine (NICODERM CQ - DOSED IN MG/24 HOURS) 21 mg/24hr patch Place 1 patch (21 mg total) onto the skin daily. Qty: 28 patch, Refills: 0    thiamine 250 MG tablet Take 1 tablet (250 mg total) by mouth daily. Qty: 30 tablet, Refills: 3      CONTINUE these medications which have NOT CHANGED   Details  amphetamine-dextroamphetamine (ADDERALL) 20 MG tablet Take 20 mg by mouth 3 (three) times daily.    QUEtiapine (SEROQUEL) 100 MG tablet Take 50-100 mg by mouth at bedtime. Refills: 3    disulfiram (ANTABUSE) 250 MG tablet Take 1 tablet (250 mg total) by mouth daily. Qty: 30 tablet, Refills: 1      STOP taking these medications     hydrochlorothiazide (HYDRODIURIL) 25 MG tablet  DISCHARGE INSTRUCTIONS:    The patient is to follow-up with primary care physician as outpatient  If you experience worsening of your admission symptoms, develop  shortness of breath, life threatening emergency, suicidal or homicidal thoughts you must seek medical attention immediately by calling 911 or calling your MD immediately  if symptoms less severe.  You Must read complete instructions/literature along with all the possible adverse reactions/side effects for all the Medicines you take and that have been prescribed to you. Take any new Medicines after you have completely understood and accept all the possible adverse reactions/side effects.   Please note  You were cared for by a hospitalist during your hospital stay. If you have any questions about your discharge medications or the care you received while you were in the hospital after you are discharged, you can call the unit and asked to speak with the hospitalist on call if the hospitalist that took care of you is not available. Once you are discharged, your primary care physician will handle any further medical issues. Please note that NO REFILLS for any discharge medications will be authorized once you are discharged, as it is imperative that you return to your primary care physician (or establish a relationship with a primary care physician if you do not have one) for your aftercare needs so that they can reassess your need for medications and monitor your lab values.    Today   CHIEF COMPLAINT:   Chief Complaint  Patient presents with  . Drug / Alcohol Assessment    HISTORY OF PRESENT ILLNESS:     VITAL SIGNS:  Blood pressure 129/87, pulse 87, temperature 98.5 F (36.9 C), resp. rate 20, height 5\' 7"  (1.702 m), weight 73.5 kg (162 lb 1.6 oz), last menstrual period 07/23/2016, SpO2 99 %.  I/O:    Intake/Output Summary (Last 24 hours) at 07/25/16 1415 Last data filed at 07/25/16 1343  Gross per 24 hour  Intake              720 ml  Output                0 ml  Net              720 ml    PHYSICAL EXAMINATION:  GENERAL:  26 y.o.-year-old patient lying in the bed with no acute  distress.  EYES: Pupils equal, round, reactive to light and accommodation. No scleral icterus. Extraocular muscles intact.  HEENT: Head atraumatic, normocephalic. Oropharynx and nasopharynx clear.  NECK:  Supple, no jugular venous distention. No thyroid enlargement, no tenderness.  LUNGS: Normal breath sounds bilaterally, no wheezing, rales,rhonchi or crepitation. No use of accessory muscles of respiration.  CARDIOVASCULAR: S1, S2 normal. No murmurs, rubs, or gallops.  ABDOMEN: Soft, non-tender, non-distended. Bowel sounds present. No organomegaly or mass.  EXTREMITIES: No pedal edema, cyanosis, or clubbing.  NEUROLOGIC: Cranial nerves II through XII are intact. Muscle strength 5/5 in all extremities. Sensation intact. Gait not checked.  PSYCHIATRIC: The patient is alert and oriented x 3.  SKIN: No obvious rash, lesion, or ulcer.   DATA REVIEW:   CBC  Recent Labs Lab 07/25/16 0450  WBC 2.3*  HGB 12.4  HCT 36.3  PLT 72*    Chemistries   Recent Labs Lab 07/24/16 1710 07/25/16 0450  NA 134* 136  K 3.6 3.4*  CL 98* 99*  CO2 28 28  GLUCOSE 97 88  BUN 12 10  CREATININE 0.65 0.62  CALCIUM 9.3 9.4  MG  --  1.7  AST 402*  --   ALT 183*  --   ALKPHOS 84  --   BILITOT 2.9*  --     Cardiac Enzymes No results for input(s): TROPONINI in the last 168 hours.  Microbiology Results  Results for orders placed or performed during the hospital encounter of 06/08/15  Rapid Influenza A&B Antigens [facility] only)     Status: None   Collection Time: 06/08/15  1:28 PM  Result Value Ref Range Status   Influenza A ([facility]) NEGATIVE NEGATIVE Final   Influenza B [facility]) NEGATIVE NEGATIVE Final    RADIOLOGY:  Dg Ribs Unilateral W/chest Right  Result Date: 07/23/2016 CLINICAL DATA:  Right chest and right rib pain. EXAM: RIGHT RIBS AND CHEST - 3+ VIEW COMPARISON:  04/13/2016 and prior radiographs FINDINGS: The cardiomediastinal silhouette is unremarkable. There is no evidence of focal airspace  disease, pulmonary edema, suspicious pulmonary nodule/mass, pleural effusion, or pneumothorax. No acute bony abnormalities are identified. No rib abnormality or acute rib fracture identified. IMPRESSION: Negative. Electronically Signed   By: Harmon PierJeffrey  Hu M.D.   On: 07/23/2016 20:37    EKG:   Orders placed or performed during the hospital encounter of 04/13/16  . EKG 12-Lead  . EKG 12-Lead  . ED EKG  . ED EKG  . EKG      Management plans discussed with the patient, family and they are in agreement.  CODE STATUS:     Code Status Orders        Start     Ordered   07/24/16 2034  Full code  Continuous     07/24/16 2033    Code Status History    Date Active Date Inactive Code Status Order ID Comments User Context   07/24/2016  5:06 PM 07/24/2016  8:33 PM Full Code [id number]  Jeanmarie PlantMcShane, James A, MD ED   02/11/2016 12:24 AM 02/14/2016  3:45 PM Full Code [id number]  Shari ProwsPucilowska, Jolanta B, MD Inpatient      TOTAL TIME TAKING CARE OF THIS PATIENT: 40 minutes.    Katharina CaperVAICKUTE,Aracelli Woloszyn M.D on 07/25/2016 at 2:15 PM  Between 7am to 6pm - Pager - [phone]  After 6pm go to [email or web address] - password [email or web address]  Fabio Neighborsagle [place] [facility]  Office  [phone]  CC: Primary care physician; Patient, No Pcp Per

## 2016-07-25 NOTE — Progress Notes (Signed)
Patient is being discharged today, IV removed, all belongings packed. Discharge instructions reviewed with the patient she verified understanding. 2 paper prescriptions were given to her. She was walked out to the visitors entrance with staff to meet her ride.

## 2016-07-26 LAB — HIV ANTIBODY (ROUTINE TESTING W REFLEX): HIV Screen 4th Generation wRfx: NONREACTIVE

## 2016-07-27 LAB — URINE CULTURE

## 2017-12-04 ENCOUNTER — Inpatient Hospital Stay
Admission: AD | Admit: 2017-12-04 | Discharge: 2017-12-08 | DRG: 885 | Disposition: A | Discharge status: Home / Self Care | Payer: Medicaid Other | Attending: Psychiatry | Admitting: Psychiatry

## 2017-12-04 ENCOUNTER — Encounter: Payer: Self-pay | Admitting: Emergency Medicine

## 2017-12-04 ENCOUNTER — Other Ambulatory Visit: Payer: Self-pay

## 2017-12-04 ENCOUNTER — Emergency Department
Admission: EM | Admit: 2017-12-04 | Discharge: 2017-12-04 | Disposition: A | Discharge status: Other Acute Inpatient Hospital | Payer: Medicaid Other | Attending: Emergency Medicine | Admitting: Emergency Medicine

## 2017-12-04 DIAGNOSIS — F1010 Alcohol abuse, uncomplicated: Secondary | ICD-10-CM | POA: Diagnosis present

## 2017-12-04 DIAGNOSIS — Z8249 Family history of ischemic heart disease and other diseases of the circulatory system: Secondary | ICD-10-CM | POA: Diagnosis not present

## 2017-12-04 DIAGNOSIS — IMO0002 Reserved for concepts with insufficient information to code with codable children: Secondary | ICD-10-CM

## 2017-12-04 DIAGNOSIS — F332 Major depressive disorder, recurrent severe without psychotic features: Secondary | ICD-10-CM | POA: Diagnosis present

## 2017-12-04 DIAGNOSIS — I10 Essential (primary) hypertension: Secondary | ICD-10-CM | POA: Diagnosis present

## 2017-12-04 DIAGNOSIS — O99330 Smoking (tobacco) complicating pregnancy, unspecified trimester: Secondary | ICD-10-CM | POA: Insufficient documentation

## 2017-12-04 DIAGNOSIS — Y929 Unspecified place or not applicable: Secondary | ICD-10-CM | POA: Insufficient documentation

## 2017-12-04 DIAGNOSIS — R45851 Suicidal ideations: Secondary | ICD-10-CM | POA: Diagnosis present

## 2017-12-04 DIAGNOSIS — O99310 Alcohol use complicating pregnancy, unspecified trimester: Secondary | ICD-10-CM | POA: Diagnosis not present

## 2017-12-04 DIAGNOSIS — S51812A Laceration without foreign body of left forearm, initial encounter: Secondary | ICD-10-CM | POA: Diagnosis not present

## 2017-12-04 DIAGNOSIS — F329 Major depressive disorder, single episode, unspecified: Secondary | ICD-10-CM | POA: Diagnosis not present

## 2017-12-04 DIAGNOSIS — Y999 Unspecified external cause status: Secondary | ICD-10-CM | POA: Insufficient documentation

## 2017-12-04 DIAGNOSIS — O039 Complete or unspecified spontaneous abortion without complication: Secondary | ICD-10-CM | POA: Diagnosis present

## 2017-12-04 DIAGNOSIS — F419 Anxiety disorder, unspecified: Secondary | ICD-10-CM | POA: Diagnosis present

## 2017-12-04 DIAGNOSIS — F909 Attention-deficit hyperactivity disorder, unspecified type: Secondary | ICD-10-CM | POA: Diagnosis present

## 2017-12-04 DIAGNOSIS — F10929 Alcohol use, unspecified with intoxication, unspecified: Secondary | ICD-10-CM | POA: Insufficient documentation

## 2017-12-04 DIAGNOSIS — Z716 Tobacco abuse counseling: Secondary | ICD-10-CM

## 2017-12-04 DIAGNOSIS — F4310 Post-traumatic stress disorder, unspecified: Secondary | ICD-10-CM | POA: Diagnosis present

## 2017-12-04 DIAGNOSIS — Z915 Personal history of self-harm: Secondary | ICD-10-CM

## 2017-12-04 DIAGNOSIS — F17210 Nicotine dependence, cigarettes, uncomplicated: Secondary | ICD-10-CM | POA: Insufficient documentation

## 2017-12-04 DIAGNOSIS — X789XXA Intentional self-harm by unspecified sharp object, initial encounter: Secondary | ICD-10-CM

## 2017-12-04 DIAGNOSIS — Z590 Homelessness: Secondary | ICD-10-CM

## 2017-12-04 DIAGNOSIS — Y907 Blood alcohol level of 200-239 mg/100 ml: Secondary | ICD-10-CM | POA: Insufficient documentation

## 2017-12-04 DIAGNOSIS — Z79899 Other long term (current) drug therapy: Secondary | ICD-10-CM | POA: Diagnosis not present

## 2017-12-04 DIAGNOSIS — Z5181 Encounter for therapeutic drug level monitoring: Secondary | ICD-10-CM | POA: Diagnosis not present

## 2017-12-04 DIAGNOSIS — Z7289 Other problems related to lifestyle: Secondary | ICD-10-CM

## 2017-12-04 DIAGNOSIS — F39 Unspecified mood [affective] disorder: Secondary | ICD-10-CM | POA: Diagnosis present

## 2017-12-04 DIAGNOSIS — F1020 Alcohol dependence, uncomplicated: Secondary | ICD-10-CM | POA: Diagnosis present

## 2017-12-04 DIAGNOSIS — R000 Tachycardia, unspecified: Secondary | ICD-10-CM | POA: Diagnosis not present

## 2017-12-04 DIAGNOSIS — Z008 Encounter for other general examination: Secondary | ICD-10-CM | POA: Insufficient documentation

## 2017-12-04 DIAGNOSIS — F32A Depression, unspecified: Secondary | ICD-10-CM

## 2017-12-04 DIAGNOSIS — Y9389 Activity, other specified: Secondary | ICD-10-CM | POA: Diagnosis not present

## 2017-12-04 DIAGNOSIS — O9A219 Injury, poisoning and certain other consequences of external causes complicating pregnancy, unspecified trimester: Secondary | ICD-10-CM | POA: Insufficient documentation

## 2017-12-04 DIAGNOSIS — S61519A Laceration without foreign body of unspecified wrist, initial encounter: Secondary | ICD-10-CM

## 2017-12-04 HISTORY — DX: Major depressive disorder, single episode, unspecified: F32.9

## 2017-12-04 HISTORY — DX: Anxiety disorder, unspecified: F41.9

## 2017-12-04 LAB — CBC WITH DIFFERENTIAL/PLATELET
Basophils Absolute: 0.1 10*3/uL (ref 0–0.1)
Basophils Relative: 1 %
EOS ABS: 0.1 10*3/uL (ref 0–0.7)
Eosinophils Relative: 2 %
HEMATOCRIT: 42 % (ref 35.0–47.0)
HEMOGLOBIN: 14.8 g/dL (ref 12.0–16.0)
LYMPHS ABS: 2.7 10*3/uL (ref 1.0–3.6)
Lymphocytes Relative: 44 %
MCH: 34.4 pg — AB (ref 26.0–34.0)
MCHC: 35.3 g/dL (ref 32.0–36.0)
MCV: 97.5 fL (ref 80.0–100.0)
Monocytes Absolute: 0.6 10*3/uL (ref 0.2–0.9)
Monocytes Relative: 9 %
NEUTROS ABS: 2.7 10*3/uL (ref 1.4–6.5)
NEUTROS PCT: 44 %
Platelets: 276 10*3/uL (ref 150–440)
RBC: 4.3 MIL/uL (ref 3.80–5.20)
RDW: 15.4 % — ABNORMAL HIGH (ref 11.5–14.5)
WBC: 6.1 10*3/uL (ref 3.6–11.0)

## 2017-12-04 LAB — URINE DRUG SCREEN, QUALITATIVE (ARMC ONLY)
Amphetamines, Ur Screen: POSITIVE — AB
Barbiturates, Ur Screen: NOT DETECTED
Benzodiazepine, Ur Scrn: NOT DETECTED
CANNABINOID 50 NG, UR ~~LOC~~: POSITIVE — AB
COCAINE METABOLITE, UR ~~LOC~~: NOT DETECTED
MDMA (Ecstasy)Ur Screen: NOT DETECTED
METHADONE SCREEN, URINE: NOT DETECTED
Opiate, Ur Screen: NOT DETECTED
Phencyclidine (PCP) Ur S: NOT DETECTED
TRICYCLIC, UR SCREEN: POSITIVE — AB

## 2017-12-04 LAB — COMPREHENSIVE METABOLIC PANEL
ALBUMIN: 4.3 g/dL (ref 3.5–5.0)
ALT: 10 U/L (ref 0–44)
ANION GAP: 8 (ref 5–15)
AST: 16 U/L (ref 15–41)
Alkaline Phosphatase: 71 U/L (ref 38–126)
BUN: 9 mg/dL (ref 6–20)
CHLORIDE: 109 mmol/L (ref 98–111)
CO2: 28 mmol/L (ref 22–32)
CREATININE: 0.7 mg/dL (ref 0.44–1.00)
Calcium: 8.9 mg/dL (ref 8.9–10.3)
Glucose, Bld: 99 mg/dL (ref 70–99)
POTASSIUM: 3.5 mmol/L (ref 3.5–5.1)
Sodium: 145 mmol/L (ref 135–145)
Total Bilirubin: 0.4 mg/dL (ref 0.3–1.2)
Total Protein: 7.5 g/dL (ref 6.5–8.1)

## 2017-12-04 LAB — POCT PREGNANCY, URINE: Preg Test, Ur: POSITIVE — AB

## 2017-12-04 LAB — ETHANOL: ALCOHOL ETHYL (B): 215 mg/dL — AB (ref <10)

## 2017-12-04 LAB — ACETAMINOPHEN LEVEL: Acetaminophen (Tylenol), Serum: <10 ug/mL — ABNORMAL LOW (ref 10–30)

## 2017-12-04 LAB — HCG, QUANTITATIVE, PREGNANCY: HCG, BETA CHAIN, QUANT, S: 284 m[IU]/mL — AB (ref <5)

## 2017-12-04 MED ORDER — ALUM & MAG HYDROXIDE-SIMETH 200-200-20 MG/5ML PO SUSP: 30.0000 mL | ORAL | Status: DC | PRN

## 2017-12-04 MED ORDER — ACETAMINOPHEN 325 MG PO TABS: 650.0000 mg | ORAL_TABLET | Freq: Four times a day (QID) | ORAL | Status: DC | PRN

## 2017-12-04 MED ORDER — VITAMIN B-1 100 MG PO TABS: 100.0000 mg | ORAL_TABLET | Freq: Every day | ORAL | Status: DC

## 2017-12-04 MED ORDER — HYDROXYZINE HCL 50 MG PO TABS
50.0000 mg | ORAL_TABLET | Freq: Three times a day (TID) | ORAL | Status: DC | PRN
  Administered 2017-12-04 – 2017-12-07 (×9): 50 mg via ORAL
  Filled 2017-12-04 (×9): qty 1

## 2017-12-04 MED ORDER — NICOTINE 21 MG/24HR TD PT24
21.0000 mg | MEDICATED_PATCH | Freq: Every day | TRANSDERMAL | Status: DC
  Administered 2017-12-04 – 2017-12-08 (×5): 21 mg via TRANSDERMAL
  Filled 2017-12-04 (×5): qty 1

## 2017-12-04 MED ORDER — TRAZODONE HCL 100 MG PO TABS
100.0000 mg | ORAL_TABLET | Freq: Every evening | ORAL | Status: DC | PRN
  Administered 2017-12-04 – 2017-12-06 (×3): 100 mg via ORAL
  Filled 2017-12-04 (×3): qty 1

## 2017-12-04 MED ORDER — VITAMIN B-1 100 MG PO TABS
100.0000 mg | ORAL_TABLET | Freq: Every day | ORAL | Status: DC
  Administered 2017-12-05 – 2017-12-08 (×4): 100 mg via ORAL
  Filled 2017-12-04 (×4): qty 1

## 2017-12-04 MED ORDER — MAGNESIUM HYDROXIDE 400 MG/5ML PO SUSP: 30.0000 mL | Freq: Every day | ORAL | Status: DC | PRN

## 2017-12-04 NOTE — ED Notes (Signed)
Hourly rounding reveals patient sleeping in room. No complaints, stable, in no acute distress. Q15 minute rounds and monitoring via Security to continue. 
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
## 2017-12-04 NOTE — ED Triage Notes (Addendum)
Pt presents to ED for behavioral concerns after she cut her left arm tonight and states she doesn't remember cutting herself. Pt states she "has been through hell the past 3 years". Pt recently found out her husband is a drug addict and states she "does not want that life". Pt denies drug use. Appears cooperative at this time. Denies any SI.

## 2017-12-04 NOTE — Tx Team (Signed)
Initial Treatment Plan 12/04/2017 3:05 PM Marie Mckenzie ZOX:[id number]    PATIENT STRESSORS: Marital or family conflict Occupational concerns Substance abuse   PATIENT STRENGTHS: Average or above average intelligence Communication skills Supportive family/friends Work skills   PATIENT IDENTIFIED PROBLEMS: Self harm behavior 12/04/2017  Substance abuse 12/04/2017                   DISCHARGE CRITERIA:  Ability to meet basic life and health needs Adequate post-discharge living arrangements Medical problems require only outpatient monitoring Verbal commitment to aftercare and medication compliance  PRELIMINARY DISCHARGE PLAN:   PATIENT/FAMILY INVOLVEMENT: This treatment plan has been presented to and reviewed with the patient, Marie Mckenzie, and/or family member, .  The patient and family have been given the opportunity to ask questions and make suggestions.  Leonarda Salon, RN 12/04/2017, 3:05 PM

## 2017-12-04 NOTE — ED Notes (Signed)
Patient transferring to BMU room 304

## 2017-12-04 NOTE — Progress Notes (Signed)
Patient pleasant and cooperative during admission assessment. Patient denies SI/HI at this time. Patient denies AVH. Patient informed of fall risk status, fall risk assessed "low" at this time. Patient oriented to unit/staff/room. Patient denies any questions/concerns at this time. Patient safe on unit with Q15 minute checks for safety. Skin assessment and body search done,no contraband found. 

## 2017-12-04 NOTE — ED Provider Notes (Signed)
[facility] Emergency Department Provider Note   ____________________________________________    I have reviewed the triage vital signs and the nursing notes.   HISTORY  Chief Complaint Psychiatric Evaluation     HPI STEPHANEY STEVEN is a 27 y.o. female with a history of anxiety, major depressive disorder who presents today after self injury.  Patient reports she relapsed on alcohol and "blacked out ".  Later she realized that she had cut herself multiple times in her left forearm.  She reports she has not done any sort of self injury for 20 years and this scared her.  She denies SI at this time.  Does admit to alcohol use.  Denies ingestions or any other drugs   Past Medical History:  Diagnosis Date  . ADHD (attention deficit hyperactivity disorder)   . Anxiety   . Hypertension   . Major depressive disorder     Patient Active Problem List   Diagnosis Date Noted  . Hypokalemia 07/25/2016  . Elevated transaminase level 07/25/2016  . Leukopenia 07/25/2016  . Thrombocytopenia (HCC) 07/25/2016  . 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 Alcohol withdrawal (HCC) 07/24/2016  . Attention deficit hyperactivity disorder (ADHD) 02/11/2016  . Major depressive disorder, recurrent severe without psychotic features (HCC) 02/10/2016  . Suicidal ideation 02/10/2016  . Involuntary commitment 02/10/2016  . Alcohol use disorder, severe, dependence (HCC) 02/10/2016  . Alcohol intoxication (HCC) 02/10/2016  . Substance induced mood disorder (HCC) 02/10/2016  . Tobacco use disorder 02/10/2016    Past Surgical History:  Procedure Laterality Date  . TONSILLECTOMY    . WISDOM TOOTH EXTRACTION      Prior to Admission medications   Medication Sig Start Date End Date Taking? Authorizing Provider  amphetamine-dextroamphetamine (ADDERALL) 20 MG tablet Take 20 mg by mouth 3 (three) times daily.    [provider]    disulfiram (ANTABUSE) 250 MG tablet Take 1 tablet (250 mg total) by mouth daily. Patient not taking: Reported on 07/23/2016 02/14/16   Pucilowska, Ellin Goodie, MD  folic acid (FOLVITE) 1 MG tablet Take 1 tablet (1 mg total) by mouth daily. Patient not taking: Reported on 12/04/2017 07/26/16   Katharina Caper, MD  nicotine (NICODERM CQ - DOSED IN MG/24 HOURS) 21 mg/24hr patch Place 1 patch (21 mg total) onto the skin daily. Patient not taking: Reported on 12/04/2017 07/25/16   Katharina Caper, MD  QUEtiapine (SEROQUEL) 100 MG tablet Take 50-100 mg by mouth at bedtime. 05/12/16   [provider]  thiamine 250 MG tablet Take 1 tablet (250 mg total) by mouth daily. Patient not taking: Reported on 12/04/2017 07/26/16   Katharina Caper, MD     Allergies Patient has no known allergies.  Family History  Problem Relation Age of Onset  . Hypertension Father     Social History Social History   Tobacco Use  . Smoking status: Current Every Day Smoker    Packs/day: 1.00    Types: Cigarettes  . Smokeless tobacco: Never Used  . Tobacco comment: Pt has order for a Nicotine Patch  Substance Use Topics  . Alcohol use: Yes    Alcohol/week: 23.0 - 37.0 standard drinks    Types: 7 Cans of beer, 16 - 30 Shots of liquor per week  . Drug use: No    Review of Systems  Constitutional: No dizziness Eyes: No visual changes.  ENT: No sore throat. Cardiovascular: Denies chest pain. Respiratory: Denies shortness of breath. Gastrointestinal: No  abdominal pain.  No nausea, no vomiting.   Genitourinary: Negative for dysuria. Musculoskeletal: Negative for back pain. Skin: As above Neurological: Negative for headaches   ____________________________________________   PHYSICAL EXAM:  VITAL SIGNS: ED Triage Vitals  Enc Vitals Group     BP 12/04/17 0516 (!) 128/91     Pulse Rate 12/04/17 0516 (!) 116     Resp 12/04/17 0516 18     Temp 12/04/17 0516 97.7 F (36.5 C)     Temp Source 12/04/17 0516 Oral      SpO2 12/04/17 0516 100 %     Weight 12/04/17 0517 79.4 kg (175 lb)     Height 12/04/17 0517 1.676 m (5\' 6" )     Head Circumference --      Peak Flow --      Pain Score 12/04/17 0549 7     Pain Loc --      Pain Edu? --      Excl. in GC? --     Constitutional: Alert and oriented.  Nose: No congestion/rhinnorhea. Mouth/Throat: Mucous membranes are moist.    Cardiovascular: Normal rate, regular rhythm. Grossly normal heart sounds.  Good peripheral circulation. Respiratory: Normal respiratory effort.  No retractions. Lungs CTAB. Gastrointestinal: Soft and nontender. No distention.  No CVA tenderness.  Musculoskeletal: No lower extremity tenderness nor edema.  Warm and well perfused Neurologic:  Normal speech and language. No gross focal neurologic deficits are appreciated.  Skin:  Skin is warm, dry.  Multiple shallow lacerations in an interlocking pattern to the left medial forearm, none requiring repair Psychiatric: Mood and affect are normal. Speech and behavior are normal.  ____________________________________________   LABS (all labs ordered are listed, but only abnormal results are displayed)  Labs Reviewed  ETHANOL - Abnormal; Notable for the following components:      Result Value   Alcohol, Ethyl (B) 215 (*)    All other components within normal limits  URINE DRUG SCREEN, QUALITATIVE ([facility] ONLY) - Abnormal; Notable for the following components:   Tricyclic, Ur Screen POSITIVE (*)    Amphetamines, Ur Screen POSITIVE (*)    Cannabinoid 50 Ng, Ur Boyd POSITIVE (*)    All other components within normal limits  CBC WITH DIFFERENTIAL/PLATELET - Abnormal; Notable for the following components:   MCH 34.4 (*)    RDW 15.4 (*)    All other components within normal limits  ACETAMINOPHEN LEVEL - Abnormal; Notable for the following components:   Acetaminophen (Tylenol), Serum <10 (*)    All other components within normal limits  POCT PREGNANCY, URINE - Abnormal; Notable for the  following components:   Preg Test, Ur POSITIVE (*)    All other components within normal limits  COMPREHENSIVE METABOLIC PANEL  HCG, QUANTITATIVE, PREGNANCY  POC URINE PREG, ED   ____________________________________________  EKG   ____________________________________________  RADIOLOGY  None ____________________________________________   PROCEDURES  Procedure(s) performed: No  Procedures   Critical Care performed: No ____________________________________________   INITIAL IMPRESSION / ASSESSMENT AND PLAN / ED COURSE  Pertinent labs & imaging results that were available during my care of the patient were reviewed by me and considered in my medical decision making (see chart for details).  Patient with a history of major depression and anxiety presents after alcohol abuse and self injury.  She denies SI but states that she is scared and would like to talk to someone.  No indication for IVC at this point, will consult psychiatry.  Lab work is reassuring.  Medically cleared   Seen by Dr. Toni Amend, he will admit    ____________________________________________   FINAL CLINICAL IMPRESSION(S) / ED DIAGNOSES  Final diagnoses:  Depression, unspecified depression type  Self-inflicted injury        Note:  This document was prepared using Dragon voice recognition software and may include unintentional dictation errors.      [name], MD 12/04/17 1140

## 2017-12-04 NOTE — BH Assessment (Signed)
Patient is to be admitted to [facility] by Dr. Toni Amend.  Attending Physician will be Dr. Johnella Moloney.   Patient has been assigned to room 305, by [facility] Charge Nurse Shatara.   Intake Paper Work has been signed and placed on patient chart.  ER staff is aware of the admission:  Luann,ER Secretary    Dr. Cyril Loosen, ER MD   Geralynn Ochs, Patient's Nurse   Lacy Duverney. [facility] Patient Access.
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
## 2017-12-05 DIAGNOSIS — F332 Major depressive disorder, recurrent severe without psychotic features: Principal | ICD-10-CM

## 2017-12-05 LAB — HEMOGLOBIN A1C
Hgb A1c MFr Bld: 4.9 % (ref 4.8–5.6)
Mean Plasma Glucose: 93.93 mg/dL

## 2017-12-05 LAB — LIPID PANEL
CHOLESTEROL: 134 mg/dL (ref 0–200)
HDL: 52 mg/dL (ref >40)
LDL Cholesterol: 69 mg/dL (ref 0–99)
TRIGLYCERIDES: 66 mg/dL (ref <150)
Total CHOL/HDL Ratio: 2.6 RATIO
VLDL: 13 mg/dL (ref 0–40)

## 2017-12-05 LAB — HCG, QUANTITATIVE, PREGNANCY: hCG, Beta Chain, Quant, S: 260 m[IU]/mL — ABNORMAL HIGH (ref <5)

## 2017-12-05 LAB — TSH: TSH: 1.916 u[IU]/mL (ref 0.350–4.500)

## 2017-12-05 LAB — CHLAMYDIA/NGC RT PCR (ARMC ONLY)
Chlamydia Tr: NOT DETECTED
N gonorrhoeae: NOT DETECTED

## 2017-12-05 MED ORDER — QUETIAPINE FUMARATE 25 MG PO TABS
50.0000 mg | ORAL_TABLET | Freq: Every day | ORAL | Status: DC
  Administered 2017-12-05 – 2017-12-06 (×2): 50 mg via ORAL
  Filled 2017-12-05 (×2): qty 2

## 2017-12-05 NOTE — Plan of Care (Signed)
Patient  verbalize understanding  information received  from saff .  Emotional and mental  status  improved . Resources  available  able to see MD today regarding  being pregnant . Verbalize understanding of the reasons why she is here. No safety concerns .   Problem: Education: Goal: Knowledge of [facility] Education information/materials will improve Outcome: Progressing Goal: Emotional status will improve Outcome: Progressing Goal: Mental status will improve Outcome: Progressing Goal: Verbalization of understanding the information provided will improve Outcome: Progressing   Problem: Health Behavior/Discharge Planning: Goal: Identification of resources available to assist in meeting health care needs will improve Outcome: Progressing Goal: Compliance with treatment plan for underlying cause of condition will improve Outcome: Progressing   Problem: Education: Goal: Knowledge of disease or condition will improve Outcome: Progressing Goal: Understanding of discharge needs will improve Outcome: Progressing   Problem: Safety: Goal: Ability to remain free from injury will improve Outcome: Progressing   Problem: Safety: Goal: Ability to remain free from injury will improve Outcome: Progressing

## 2017-12-05 NOTE — Progress Notes (Addendum)
D: Patient is alert and oriented. Patient presents with a mostly sad and flat mood. Patient is pleasant and cooperative during interactions with this RN. Patient reports not sleeping well and a good appetite. Patients left forearm lacerations (prior to admission) assessed, clean and free from signs of infection. Patient denies SI, HI, AVH, and verbally contracts for safety. Patient rates her anxiety 9/10 and depression 9/10. Patient denies pain.   A: Scheduled medications administered per MD order. Support provided. Routine safety checks every 15 minutes. Patient stated understanding to tell nurse about any new physical symptoms she experiences. Patient understands to tell staff if she needs anything.    R: No adverse drug reactions noted. Patient verbally contracts for safety. Patient remains safe at this time and will continue to monitor.

## 2017-12-05 NOTE — Plan of Care (Signed)
Pt. Verbalizes understanding of provided education. Pt. Complaint with medications. Pt. Reports she can remain safe while on the unit. Pt. Verbalizes she will come to nursing staff if she feels the urge to cut herself. Pt. Denies SI/HI, verbally is able to contract for safety. Pt. Reports this evening depression and anxiety, "high" with no number value. Pt. Given PRN medications for comfort per pt. Request this evening.    Problem: Education: Goal: Emotional status will improve Outcome: Not Progressing Goal: Mental status will improve Outcome: Not Progressing   Problem: Education: Goal: Knowledge of [place] Education information/materials will improve Outcome: Progressing   Problem: Health Behavior/Discharge Planning: Goal: Compliance with treatment plan for underlying cause of condition will improve Outcome: Progressing   Problem: Safety: Goal: Ability to remain free from injury will improve Outcome: Progressing   Problem: Safety: Goal: Ability to remain free from injury will improve Outcome: Progressing

## 2017-12-05 NOTE — Progress Notes (Signed)
D: Pt denies SI/HI/AVH, verbally is able to contract for safety. Pt. Reports she can remain safe while on the unit. Pt. Verbalizes she will come to nursing staff if she feels the urge to cut herself. Pt is pleasant and cooperative during assessments, but visibly anxious and sad affect. Pt. Reports this evening depression and anxiety, "high" with no number value. Pt. Given PRN medications to aid in comfort and sleep per patient requests. Pt. Tachycardic, but denies pain and is non-symptomatic. Pt. Reports, "my heart rate is always a little elevated, especially without my meds". Will notify treatment team during hand off report of tachycardia. Pt. Lacerations to left forearm from prior to admissions assessed by this writer. Pt. Cuts are superficial with no drainage present or signs of infection.     A: Q x 15 minute observation checks were completed for safety. Patient was provided with education.  Patient was given/offered medications per orders. Patient  was encourage to attend groups, participate in unit activities and continue with plan of care. Pt. Chart and plans of care reviewed. Pt. Given support and encouragement.   R: Patient is complaint with medication and unit procedures.             Precautionary checks every 15 minutes for safety maintained, room free of safety hazards, patient sustains no injury or falls during this shift. Will endorse care to next shift.

## 2017-12-05 NOTE — Consult Note (Signed)
Obstetric H&P   Consulting Question: Undesired pregnancy  Consulting Department: Behavioral Health  Consulting Clinician: Haskell Riling, MD  Prenatal Care Provider: None  History of Present Illness: 27 y.o. currently admitted to behavioral health who presents with positive UPT.  The patient is a reliable historian.  She reports LMP of 10/11/17, positive home pregnancy test with subsequent evaluation at [facility] where she reports being treated with 1 pill in the clinic followed by 4 buccal administered pills on 11/23/17.  This is consistent with a mifepristone and misoprostol regimen commonly used for induced medical abortions prior to [redacted] weeks gestation.  This particular regimen has a 90% success rate in achieving pregnancy termination.  The patient reports that she did have an ultrasound prior to proceeding with medication administration, and she did have bleeding with passage of tissue.  She still has some light spotting at this time.  No cramping currently.  HCG levels are down trending.      Review of Systems: 10 point review of systems negative unless otherwise noted in HPI  Past Medical History: Past Medical History:  Diagnosis Date  . ADHD (attention deficit hyperactivity disorder)   . Anxiety   . Hypertension   . Major depressive disorder     Past Surgical History: Past Surgical History:  Procedure Laterality Date  . TONSILLECTOMY    . WISDOM TOOTH EXTRACTION      Past Obstetric History: No OB History data found  Past Gynecologic History:  Family History: Family History  Problem Relation Age of Onset  . Hypertension Father     Social History: Social History   Socioeconomic History  . Marital status: Single    Spouse name: Not on file  . Number of children: Not on file  . Years of education: Not on file  . Highest education level: Not on file  Occupational History  . Not on file  Social Needs  . Financial resource strain: Not on file  . Food  insecurity:    Worry: Not on file    Inability: Not on file  . Transportation needs:    Medical: Not on file    Non-medical: Not on file  Tobacco Use  . Smoking status: Current Every Day Smoker    Packs/day: 1.00    Types: Cigarettes  . Smokeless tobacco: Never Used  . Tobacco comment: Pt has order for a Nicotine Patch  Substance and Sexual Activity  . Alcohol use: Yes    Alcohol/week: 23.0 - 37.0 standard drinks    Types: 7 Cans of beer, 16 - 30 Shots of liquor per week  . Drug use: No  . Sexual activity: Yes    Birth control/protection: Condom  Lifestyle  . Physical activity:    Days per week: Not on file    Minutes per session: Not on file  . Stress: Not on file  Relationships  . Social connections:    Talks on phone: Not on file    Gets together: Not on file    Attends religious service: Not on file    Active member of club or organization: Not on file    Attends meetings of clubs or organizations: Not on file    Relationship status: Not on file  . Intimate partner violence:    Fear of current or ex partner: Not on file    Emotionally abused: Not on file    Physically abused: Not on file    Forced sexual activity: Not on file  Other Topics Concern  . Not on file  Social History Narrative  . Not on file    Medications: Prior to Admission medications   Medication Sig Start Date End Date Taking? Authorizing Provider  amphetamine-dextroamphetamine (ADDERALL) 20 MG tablet Take 20 mg by mouth 3 (three) times daily.    [provider]  disulfiram (ANTABUSE) 250 MG tablet Take 1 tablet (250 mg total) by mouth daily. Patient not taking: Reported on 07/23/2016 02/14/16   Pucilowska, Ellin Goodie, MD  folic acid (FOLVITE) 1 MG tablet Take 1 tablet (1 mg total) by mouth daily. Patient not taking: Reported on 12/04/2017 07/26/16   Katharina Caper, MD  nicotine (NICODERM CQ - DOSED IN MG/24 HOURS) 21 mg/24hr patch Place 1 patch (21 mg total) onto the skin daily. Patient not  taking: Reported on 12/04/2017 07/25/16   Katharina Caper, MD  QUEtiapine (SEROQUEL) 100 MG tablet Take 50-100 mg by mouth at bedtime. 05/12/16   [provider]  thiamine 250 MG tablet Take 1 tablet (250 mg total) by mouth daily. Patient not taking: Reported on 12/04/2017 07/26/16   Katharina Caper, MD    Allergies: No Known Allergies  Physical Exam: Vitals: Blood pressure 110/70, pulse 91, temperature 98.4 F (36.9 C), temperature source Oral, resp. rate 16, height 5\' 6"  (1.676 m), weight 77.1 kg, SpO2 97 %.  Urine Dip Protein:   General: NAD HEENT: normocephalic, anicteric Pulmonary: No increased work of breathing Neurologic: Grossly intact Psychiatric: mood appropriate, affect full  Labs: Results for orders placed or performed during the hospital encounter of 12/04/17 (from the past 24 hour(s))  hCG, quantitative, pregnancy     Status: Abnormal   Collection Time: 12/05/17  6:46 AM  Result Value Ref Range   hCG, Beta Chain, Quant, S 260 (H) <5 mIU/mL  Lipid panel     Status: None   Collection Time: 12/05/17  6:46 AM  Result Value Ref Range   Cholesterol 134 0 - 200 mg/dL   Triglycerides 66 <161 mg/dL   HDL 52 >09 mg/dL   Total CHOL/HDL Ratio 2.6 RATIO   VLDL 13 0 - 40 mg/dL   LDL Cholesterol 69 0 - 99 mg/dL  TSH     Status: None   Collection Time: 12/05/17  6:46 AM  Result Value Ref Range   TSH 1.916 0.350 - 4.500 uIU/mL    Assessment: 27 y.o. with completed medical abortion Plan: 1) HCG level - the patient relays history of having completed a mifepristone and misoprostol regimen for medically induced abortion on 11/23/2017.  She did obtain ultrasound prior to proceed to verify pregnancy and I'm assuming rule out ectopic pregnancy.  Her HCG level are down trending.  Based on history and HCG levels I think it is reasonable to classify this as a completed abortion.  HCG levels may take several weeks to trend to 0 depending on the starting point.  Follow up ultrasound to  verify completion is not generally warranted given the high success rate of this regimen.  HCG levels may be trended but are not necessary during this admission.  No particular consideration need to be given in her psychiatric medications as this does not represent a viable or continuing pregnancy.  She may have irregular bleeding for up to 4 weeks following treatment.    2) Contraception - I would recommend the patient follow up with myself or the [facility] Department for consideration of long acting reversible contraceptive such as IUD  3) Will sign  off of consult for now unless other questions or issues arise.  Vena Austria, MD, [facility], [facility] 12/05/2017, 12:09 PM

## 2017-12-05 NOTE — Progress Notes (Signed)
D: Patient stated slept fair last night .Stated appetite is good and energy level  lowl. Stated concentration is poor . Stated on Depression scale 9 , hopeless 9 and anxiety 7 .( low 0-10 high) Denies suicidal  homicidal ideations  .  No auditory hallucinations  No pain concerns . Appropriate ADL'S. Interacting with peers and staff.  Patient  verbalize understanding  information received  from saff .  Emotional and mental  status  improved . Resources  available  able to see MD today regarding  being pregnant . Verbalize understanding of the reasons why she is here. No safety concerns . A: Encourage patient participation with unit programming . Instruction  Given on  Medication , verbalize understanding. R: Voice no other concerns. Staff continue to monitor
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
## 2017-12-05 NOTE — B[facility] Group Notes (Signed)
LCSW Group Therapy Note  12/05/2017 1:15pm  Type of Therapy and Topic:  Group Therapy:  Cognitive Distortions  Participation Level:  Did Not Attend   Description of Group:    Patients in this group will be introduced to the topic of cognitive distortions.  Patients will identify and describe cognitive distortions, describe the feelings these distortions create for them.  Patients will identify one or more situations in their personal life where they have cognitively distorted thinking and will verbalize challenging this cognitive distortion through positive thinking skills.  Patients will practice the skill of using positive affirmations to challenge cognitive distortions using affirmation cards.    Therapeutic Goals:  1. Patient will identify two or more cognitive distortions they have used 2. Patient will identify one or more emotions that stem from use of a cognitive distortion 3. Patient will demonstrate use of a positive affirmation to counter a cognitive distortion through discussion and/or role play. 4. Patient will describe one way cognitive distortions can be detrimental to wellness   Summary of Patient Progress: Pt was invited to attend group but chose not to attend. CSW will continue to encourage pt to attend group throughout their admission.      Therapeutic Modalities:   Cognitive Behavioral Therapy Motivational Interviewing   Suzy Kugel  CUEBAS-COLON, LCSW 12/05/2017 12:36 PM
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
## 2017-12-06 DIAGNOSIS — Z5181 Encounter for therapeutic drug level monitoring: Secondary | ICD-10-CM

## 2017-12-06 DIAGNOSIS — R000 Tachycardia, unspecified: Secondary | ICD-10-CM

## 2017-12-06 LAB — HCG, QUANTITATIVE, PREGNANCY: hCG, Beta Chain, Quant, S: 247 m[IU]/mL — ABNORMAL HIGH (ref <5)

## 2017-12-06 MED ORDER — CITALOPRAM HYDROBROMIDE 20 MG PO TABS
10.0000 mg | ORAL_TABLET | Freq: Every day | ORAL | Status: DC
  Administered 2017-12-06 – 2017-12-08 (×3): 10 mg via ORAL
  Filled 2017-12-06 (×3): qty 1

## 2017-12-06 MED ORDER — AMPHETAMINE-DEXTROAMPHETAMINE 5 MG PO TABS
20.0000 mg | ORAL_TABLET | Freq: Every day | ORAL | Status: DC
  Administered 2017-12-06 – 2017-12-07 (×2): 20 mg via ORAL
  Filled 2017-12-06 (×2): qty 4
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
## 2017-12-06 NOTE — Plan of Care (Signed)
  Problem: Education: Goal: Knowledge of  General Education information/materials will improve Outcome: Progressing Goal: Verbalization of understanding the information provided will improve Outcome: Progressing   Problem: Safety: Goal: Ability to remain free from injury will improve Outcome: Progressing  Patient is oriented to the unit and remains free from injury.

## 2017-12-06 NOTE — Plan of Care (Signed)
D: Pt denies SI/HI/AV hallucinations. Pt is pleasant and cooperative. Pt rated her depression 9/10, hopelessness 8/10 and anxiety 8/10. Patient did not engage in conversation with this writer only minimal response when questioned about how she rated her responses to depression, anxiety and hopelessness. A: Pt was offered support and encouragement. Pt was given scheduled medications. Pt was encourage to attend groups. Q 15 minute checks were done for safety.  R:Pt attends groups and interacts well with peers and staff. Pt is taking medication. Pt has no complaints.Pt receptive to treatment and safety maintained on unit.    Problem: Safety: Goal: Ability to remain free from injury will improve Outcome: Progressing

## 2017-12-06 NOTE — BHH Group Notes (Signed)
LCSW Group Therapy Note 12/06/2017 1:15pm  Type of Therapy and Topic: Group Therapy: Feelings Around Returning Home & Establishing a Supportive Framework and Supporting Oneself When Supports Not Available  Participation Level: Active  Description of Group:  Patients first processed thoughts and feelings about upcoming discharge. These included fears of upcoming changes, lack of change, new living environments, judgements and expectations from others and overall stigma of mental health issues. The group then discussed the definition of a supportive framework, what that looks and feels like, and how do to discern it from an unhealthy non-supportive network. The group identified different types of supports as well as what to do when your family/friends are less than helpful or unavailable  Therapeutic Goals  1. Patient will identify one healthy supportive network that they can use at discharge. 2. Patient will identify one factor of a supportive framework and how to tell it from an unhealthy network. 3. Patient able to identify one coping skill to use when they do not have positive supports from others. 4. Patient will demonstrate ability to communicate their needs through discussion and/or role plays.  Summary of Patient Progress:  The patient scored her mood at a 2 (10 best.) Pt engaged during group session. As patients processed their anxiety about discharge and described healthy supports patient shared that she is not ready to be discharge. The patient listed her mom and best friend as her main support system.  Patients identified at least one self-care tool they were willing to use after discharge; video games.   Therapeutic Modalities Cognitive Behavioral Therapy Motivational Interviewing   Darnette Lampron  CUEBAS-COLON, LCSW 12/06/2017 11:53 AM

## 2017-12-06 NOTE — Progress Notes (Addendum)
[facility] MD Progress Note  12/06/2017 10:51 AM Marie Mckenzie  MRN:  [id number] Subjective:   I feel ok, I want my medicine back" Pt still depressed, but denies SI, wanting her meds back, pt states she had tried Celexa in the past and that helped, denies AVH. slept well.  Principal Problem: <principal problem not specified> Diagnosis:   Patient Active Problem List   Diagnosis Date Noted  . Self-inflicted laceration of wrist [S61.519A] 12/04/2017  . Severe recurrent major depression without psychotic features (HCC) [F33.2] 12/04/2017  . Hypokalemia [E87.6] 07/25/2016  . Elevated transaminase level [R74.0] 07/25/2016  . Leukopenia [D72.819] 07/25/2016  . Thrombocytopenia (HCC) [D69.6] 07/25/2016  . 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 Alcohol withdrawal (HCC) [F10.239] 07/24/2016  . Attention deficit hyperactivity disorder (ADHD) [F90.9] 02/11/2016  . Major depressive disorder, recurrent severe without psychotic features (HCC) [F33.2] 02/10/2016  . Suicidal ideation [R45.851] 02/10/2016  . Involuntary commitment [Z04.6] 02/10/2016  . Alcohol use disorder, severe, dependence (HCC) [F10.20] 02/10/2016  . Alcohol intoxication (HCC) [F10.929] 02/10/2016  . Substance induced mood disorder (HCC) [F19.94] 02/10/2016  . Tobacco use disorder [F17.200] 02/10/2016   Total Time spent with patient: 25 min  Past Psychiatric History: see H &P  Past Medical History:  Past Medical History:  Diagnosis Date  . ADHD (attention deficit hyperactivity disorder)   . Anxiety   . Hypertension   . Major depressive disorder     Past Surgical History:  Procedure Laterality Date  . TONSILLECTOMY    . WISDOM TOOTH EXTRACTION     Family History:  Family History  Problem Relation Age of Onset  . Hypertension Father    Family Psychiatric  History: see H & P Social History:  Social History   Substance and Sexual Activity  Alcohol Use Yes  .  Alcohol/week: 23.0 - 37.0 standard drinks  . Types: 7 Cans of beer, 16 - 30 Shots of liquor per week     Social History   Substance and Sexual Activity  Drug Use No    Social History   Socioeconomic History  . Marital status: Single    Spouse name: Not on file  . Number of children: Not on file  . Years of education: Not on file  . Highest education level: Not on file  Occupational History  . Not on file  Social Needs  . Financial resource strain: Not on file  . Food insecurity:    Worry: Not on file    Inability: Not on file  . Transportation needs:    Medical: Not on file    Non-medical: Not on file  Tobacco Use  . Smoking status: Current Every Day Smoker    Packs/day: 1.00    Types: Cigarettes  . Smokeless tobacco: Never Used  . Tobacco comment: Pt has order for a Nicotine Patch  Substance and Sexual Activity  . Alcohol use: Yes    Alcohol/week: 23.0 - 37.0 standard drinks    Types: 7 Cans of beer, 16 - 30 Shots of liquor per week  . Drug use: No  . Sexual activity: Yes    Birth control/protection: Condom  Lifestyle  . Physical activity:    Days per week: Not on file    Minutes per session: Not on file  . Stress: Not on file  Relationships  . Social connections:    Talks on phone: Not on file    Gets together: Not on file    Attends  religious service: Not on file    Active member of club or organization: Not on file    Attends meetings of clubs or organizations: Not on file    Relationship status: Not on file  Other Topics Concern  . Not on file  Social History Narrative  . Not on file   Additional Social History:    History of alcohol / drug use?: Yes Name of Substance 1: Alcohol 1 - Last Use / Amount: 12/03/2017                  Sleep: Good  Appetite:  Fair  Current Medications: Current Facility-Administered Medications  Medication Dose Route Frequency Provider Last Rate Last Dose  . acetaminophen (TYLENOL) tablet 650 mg  650 mg Oral  Q6H PRN Clapacs, John T, MD      . alum & mag hydroxide-simeth (MAALOX/MYLANTA) 200-200-20 MG/5ML suspension 30 mL  30 mL Oral Q4H PRN Clapacs, John T, MD      . amphetamine-dextroamphetamine (ADDERALL) tablet 20 mg  20 mg Oral Q breakfast Beverly Sessions, MD      . citalopram (CELEXA) tablet 10 mg  10 mg Oral Daily Beverly Sessions, MD      . hydrOXYzine (ATARAX/VISTARIL) tablet 50 mg  50 mg Oral TID PRN Clapacs, Jackquline Denmark, MD   50 mg at 12/06/17 0757  . magnesium hydroxide (MILK OF MAGNESIA) suspension 30 mL  30 mL Oral Daily PRN Clapacs, John T, MD      . nicotine (NICODERM CQ - dosed in mg/24 hours) patch 21 mg  21 mg Transdermal Daily Pucilowska, Jolanta B, MD   21 mg at 12/06/17 0758  . QUEtiapine (SEROQUEL) tablet 50 mg  50 mg Oral QHS Beverly Sessions, MD   50 mg at 12/05/17 2110  . thiamine (VITAMIN B-1) tablet 100 mg  100 mg Oral Daily Clapacs, Jackquline Denmark, MD   100 mg at 12/06/17 0757  . traZODone (DESYREL) tablet 100 mg  100 mg Oral QHS PRN Clapacs, Jackquline Denmark, MD   100 mg at 12/05/17 2110    Lab Results:  Results for orders placed or performed during the hospital encounter of 12/04/17 (from the past 48 hour(s))  hCG, quantitative, pregnancy     Status: Abnormal   Collection Time: 12/05/17  6:46 AM  Result Value Ref Range   hCG, Beta Chain, Quant, S 260 (H) <5 mIU/mL    Comment:          GEST. AGE      CONC.  (mIU/mL)   <=1 WEEK        5 - 50     2 WEEKS       50 - 500     3 WEEKS       100 - 10,000     4 WEEKS     1,000 - 30,000     5 WEEKS     3,500 - 115,000   6-8 WEEKS     12,000 - 270,000    12 WEEKS     15,000 - 220,000        FEMALE AND NON-PREGNANT FEMALE:     LESS THAN 5 mIU/mL Performed at [facility], [address] [place], [place] [address]   Hemoglobin A1c     Status: None   Collection Time: 12/05/17  6:46 AM  Result Value Ref Range   Hgb A1c MFr Bld 4.9 4.8 - 5.6 %    Comment: (NOTE) Pre diabetes:  5.7%-6.4% Diabetes:               >6.4% Glycemic control for   <7.0% adults with diabetes    Mean Plasma Glucose 93.93 mg/dL    Comment: Performed at [facility], [address], [place], [place] [address]  Lipid panel     Status: None   Collection Time: 12/05/17  6:46 AM  Result Value Ref Range   Cholesterol 134 0 - 200 mg/dL   Triglycerides 66 <604 mg/dL   HDL 52 >54 mg/dL   Total CHOL/HDL Ratio 2.6 RATIO   VLDL 13 0 - 40 mg/dL   LDL Cholesterol 69 0 - 99 mg/dL    Comment:        Total Cholesterol/HDL:CHD Risk Coronary Heart Disease Risk Table                     Men   Women  1/2 Average Risk   3.4   3.3  Average Risk       5.0   4.4  2 X Average Risk   9.6   7.1  3 X Average Risk  23.4   11.0        Use the calculated Patient Ratio above and the CHD Risk Table to determine the patient's CHD Risk.        ATP III CLASSIFICATION (LDL):  <100     mg/dL   Optimal  098-119  mg/dL   Near or Above                    Optimal  130-159  mg/dL   Borderline  147-829  mg/dL   High  >562     mg/dL   Very High Performed at [facility], [address] [place], [place] [address]   TSH     Status: None   Collection Time: 12/05/17  6:46 AM  Result Value Ref Range   TSH 1.916 0.350 - 4.500 uIU/mL    Comment: Performed by a 3rd Generation assay with a functional sensitivity of <=0.01 uIU/mL. Performed at [facility], [address] [place], [place] [address]   Chlamydia/NGC rt PCR [facility] only)     Status: None   Collection Time: 12/05/17  7:18 PM  Result Value Ref Range   Specimen source GC/Chlam URINE, RANDOM    Chlamydia Tr NOT DETECTED NOT DETECTED   N gonorrhoeae NOT DETECTED NOT DETECTED    Comment: (NOTE) This CT/NG assay has not been evaluated in patients with a history of  hysterectomy. Performed at [facility], [address] [place], [place] [address]   hCG, quantitative, pregnancy     Status: Abnormal   Collection Time: 12/06/17  7:10 AM  Result Value Ref  Range   hCG, Beta Chain, Quant, S 247 (H) <5 mIU/mL    Comment:          GEST. AGE      CONC.  (mIU/mL)   <=1 WEEK        5 - 50     2 WEEKS       50 - 500     3 WEEKS       100 - 10,000     4 WEEKS     1,000 - 30,000     5 WEEKS     3,500 - 115,000   6-8 WEEKS     12,000 - 270,000    12  WEEKS     15,000 - 220,000        FEMALE AND NON-PREGNANT FEMALE:     LESS THAN 5 mIU/mL Performed at [facility], [address] [place], [place] [address]     Blood Alcohol level:  Lab Results  Component Value Date   ETH 215 (H) 12/04/2017   ETH <5 07/24/2016    Metabolic Disorder Labs: Lab Results  Component Value Date   HGBA1C 4.9 12/05/2017   MPG 93.93 12/05/2017   No results found for: PROLACTIN Lab Results  Component Value Date   CHOL 134 12/05/2017   TRIG 66 12/05/2017   HDL 52 12/05/2017   CHOLHDL 2.6 12/05/2017   VLDL 13 12/05/2017   LDLCALC 69 12/05/2017    Physical Findings: AIMS:  , ,  ,  ,    CIWA:  CIWA-Ar Total: 2 COWS:     Musculoskeletal: Strength & Muscle Tone: within normal limits Gait & Station: normal Patient leans:   Psychiatric Specialty Exam: Physical Exam  Nursing note and vitals reviewed.   ROS  Blood pressure (!) 115/91, pulse 82, temperature 97.9 F (36.6 C), temperature source Oral, resp. rate 18, height 5\' 6"  (1.676 m), weight 77.1 kg, SpO2 100 %.Body mass index is 27.44 kg/m.  General Appearance:Disheveled  Eye Contact:Fair  Speech:Normal Rate  Volume:Decreased  Mood:sad  Affect:Congruent, depressed  Thought Process:Goal Directed  Orientation:Full (Time, Place, and Person)  Thought Content:Logical, denies AVH  Suicidal Thoughts:denies  Homicidal Thoughts:No  Memory:Immediate;Fair Recent;Fair Remote;Fair  Judgement:Fair  Insight:Fair  Psychomotor Activity:Decreased  Concentration:Concentration:Fair  Recall:Poor  Fund of Knowledge:Poor  Language:Fair  Akathisia:No   Handed:Right  AIMS (if indicated):   Assets:Communication Skills Desire for Improvement Physical Health Resilience  ADL's:Intact  Cognition:WNL  Sleep: 6.75      Treatment Plan Summary: Daily contact with patient to assess and evaluate symptoms and progress in treatment and Medication management. Pt with history of depression and alcohol abuse who comes into the hospital with self-inflicted cuts. S/P abortion tx for unwanted early pregnancy, OB/GYN input appreciated.   cont seroquel for mood. Restart adderall but with lower  dose, start celexa for depression. Will order EKG.  Group/Milieu tx.   Beverly Sessions, MD 12/06/2017, 10:51 AM

## 2017-12-07 MED ORDER — AMPHETAMINE-DEXTROAMPHETAMINE 5 MG PO TABS
20.0000 mg | ORAL_TABLET | Freq: Every day | ORAL | Status: DC
  Administered 2017-12-08: 20 mg via ORAL
  Filled 2017-12-07: qty 4

## 2017-12-07 MED ORDER — TRAZODONE HCL 100 MG PO TABS: 100.0000 mg | ORAL_TABLET | Freq: Every evening | ORAL | 0 refills | Status: DC | PRN

## 2017-12-07 MED ORDER — QUETIAPINE FUMARATE 100 MG PO TABS
100.0000 mg | ORAL_TABLET | Freq: Every day | ORAL | Status: DC
  Administered 2017-12-07: 100 mg via ORAL
  Filled 2017-12-07: qty 1

## 2017-12-07 MED ORDER — QUETIAPINE FUMARATE 50 MG PO TABS: 50.0000 mg | ORAL_TABLET | Freq: Every day | ORAL | 0 refills | Status: DC

## 2017-12-07 MED ORDER — HYDROXYZINE HCL 50 MG PO TABS: 50.0000 mg | ORAL_TABLET | Freq: Three times a day (TID) | ORAL | 0 refills | Status: DC | PRN

## 2017-12-07 MED ORDER — THIAMINE HCL 100 MG PO TABS: 100.0000 mg | ORAL_TABLET | Freq: Every day | ORAL | Status: DC

## 2017-12-07 MED ORDER — CITALOPRAM HYDROBROMIDE 10 MG PO TABS: 10.0000 mg | ORAL_TABLET | Freq: Every day | ORAL | 0 refills | Status: DC

## 2017-12-07 MED ORDER — QUETIAPINE FUMARATE 100 MG PO TABS: 100.0000 mg | ORAL_TABLET | Freq: Every day | ORAL | 0 refills | Status: DC

## 2017-12-07 NOTE — Progress Notes (Signed)
D: Pt denies SI/HI/AVH, verbally is able to contract for safety. Pt is pleasant and cooperative during assessments. Pt. has no Complaints, but does before bed request PRN vistaril and trazodone to aid in calming down for sleep. Pt. Reports, "vistaril with my other meds keeps my mind from racing and settles me". Patient Interactions with staff and peers appropriate. Pt. Affect is brighter, but mostly flat often. Pt. Observed this evening frequently pacing around the unit. Pt. Reports during assessments depression and anxiety, "2/10" for both. Pt. Reports having a good day.     A: Q x 15 minute observation checks were completed for safety. Patient was provided with education.  Patient was given/offered medications per orders. Patient  was encourage to attend groups, participate in unit activities and continue with plan of care. Pt. Chart and plans of care reviewed. Pt. Given support and encouragement.   R: Patient is complaint with medication and unit procedures.             Precautionary checks every 15 minutes for safety maintained, room free of safety hazards, patient sustains no injury or falls during this shift. Will endorse care to next shift.

## 2017-12-07 NOTE — Progress Notes (Signed)
D: Patient stated slept fair last night .Stated appetite fair and energy level  Is normal. Stated concentration is good . Stated on Depression scale ,0 hopeless 0  and anxiety 0 .( low 0-10 high) Denies suicidal  homicidal ideations  .  No auditory hallucinations  No pain concerns . Appropriate ADL'S. Interacting with peers and staff. Working on Pharmacologist.Verbalize understanding of information received concerning medications Patient aware of information received from staff referring to education about unit programing . Able to verbalize understanding.Attending unit programing , verbalizing feelings Voice no concerns around sleep. Voice no safety concerns . Emotional and mental health status improving .No anger outburst , appropriate behaviorEncourage to shower. Limited socialization with peers and staff . Patient  Was able to attend  Treatment  Tam this am . Voice of goal  Was medication  Management . Stated she will go home live  With mother for a while and proceed to her own apartment .  A: Encourage patient participation with unit programming . Instruction  Given on  Medication , verbalize understanding. R: Voice no other concerns. Staff continue to monitor

## 2017-12-07 NOTE — Plan of Care (Signed)
Pt. Verbalizes understanding of provided education. Pt. Reports during assessments depression and anxiety, "2/10" for both. Pt. Complaint with medications. Pt. Reports she is able to remain safe while on the unit.    Problem: Education: Goal: Knowledge of [facility] Education information/materials will improve Outcome: Progressing Goal: Emotional status will improve 12/07/2017 0301 by Lenox Ponds, RN Outcome: Progressing 12/07/2017 0258 by Lenox Ponds, RN Outcome: Progressing Goal: Mental status will improve Outcome: Progressing   Problem: Health Behavior/Discharge Planning: Goal: Compliance with treatment plan for underlying cause of condition will improve 12/07/2017 0301 by Lenox Ponds, RN Outcome: Progressing 12/07/2017 0258 by Lenox Ponds, RN Outcome: Progressing   Problem: Safety: Goal: Ability to remain free from injury will improve Outcome: Progressing   Problem: Safety: Goal: Ability to remain free from injury will improve Outcome: Progressing

## 2017-12-07 NOTE — Tx Team (Signed)
Interdisciplinary Treatment and Diagnostic Plan Update  12/07/2017 Time of Session: 11am Marie Mckenzie MRN: [id number]  Principal Diagnosis: <principal problem not specified>  Secondary Diagnoses: Active Problems:   Severe recurrent major depression without psychotic features (HCC)   Current Medications:  Current Facility-Administered Medications  Medication Dose Route Frequency Provider Last Rate Last Dose  . acetaminophen (TYLENOL) tablet 650 mg  650 mg Oral Q6H PRN Clapacs, John T, MD      . alum & mag hydroxide-simeth (MAALOX/MYLANTA) 200-200-20 MG/5ML suspension 30 mL  30 mL Oral Q4H PRN Clapacs, John T, MD      . Melene Muller ON 12/08/2017] amphetamine-dextroamphetamine (ADDERALL) tablet 20 mg  20 mg Oral Q breakfast McNew, Holly R, MD      . citalopram (CELEXA) tablet 10 mg  10 mg Oral Daily Beverly Sessions, MD   10 mg at 12/07/17 0818  . hydrOXYzine (ATARAX/VISTARIL) tablet 50 mg  50 mg Oral TID PRN Clapacs, Jackquline Denmark, MD   50 mg at 12/07/17 0818  . magnesium hydroxide (MILK OF MAGNESIA) suspension 30 mL  30 mL Oral Daily PRN Clapacs, John T, MD      . nicotine (NICODERM CQ - dosed in mg/24 hours) patch 21 mg  21 mg Transdermal Daily Pucilowska, Jolanta B, MD   21 mg at 12/07/17 0821  . QUEtiapine (SEROQUEL) tablet 50 mg  50 mg Oral QHS Beverly Sessions, MD   50 mg at 12/06/17 2103  . thiamine (VITAMIN B-1) tablet 100 mg  100 mg Oral Daily Clapacs, Jackquline Denmark, MD   100 mg at 12/07/17 0817  . traZODone (DESYREL) tablet 100 mg  100 mg Oral QHS PRN Clapacs, Jackquline Denmark, MD   100 mg at 12/06/17 2103   PTA Medications: Medications Prior to Admission  Medication Sig Dispense Refill Last Dose  . amphetamine-dextroamphetamine (ADDERALL) 20 MG tablet Take 20 mg by mouth 3 (three) times daily.   Not Taking at Unknown time  . disulfiram (ANTABUSE) 250 MG tablet Take 1 tablet (250 mg total) by mouth daily. (Patient not taking: Reported on 07/23/2016) 30 tablet 1 Not Taking at Unknown time  . folic  acid (FOLVITE) 1 MG tablet Take 1 tablet (1 mg total) by mouth daily. (Patient not taking: Reported on 12/04/2017) 30 tablet 3 Not Taking at Unknown time  . nicotine (NICODERM CQ - DOSED IN MG/24 HOURS) 21 mg/24hr patch Place 1 patch (21 mg total) onto the skin daily. (Patient not taking: Reported on 12/04/2017) 28 patch 0 Not Taking at Unknown time  . QUEtiapine (SEROQUEL) 100 MG tablet Take 50-100 mg by mouth at bedtime.  3 Not Taking at Unknown time  . thiamine 250 MG tablet Take 1 tablet (250 mg total) by mouth daily. (Patient not taking: Reported on 12/04/2017) 30 tablet 3 Not Taking at Unknown time    Patient Stressors: Marital or family conflict Occupational concerns Substance abuse  Patient Strengths: Average or above average intelligence Communication skills Supportive family/friends Work skills  Treatment Modalities: Medication Management, Group therapy, Case management,  1 to 1 session with clinician, Psychoeducation, Recreational therapy.   Physician Treatment Plan for Primary Diagnosis: <principal problem not specified> Long Term Goal(s): Improvement in symptoms so as ready for discharge Improvement in symptoms so as ready for discharge   Short Term Goals: Ability to identify changes in lifestyle to reduce recurrence of condition will improve Ability to verbalize feelings will improve Ability to disclose and discuss suicidal ideas Ability to demonstrate self-control will improve Ability to  identify and develop effective coping behaviors will improve Ability to maintain clinical measurements within normal limits will improve Compliance with prescribed medications will improve Ability to identify triggers associated with substance abuse/mental health issues will improve Ability to identify changes in lifestyle to reduce recurrence of condition will improve Ability to verbalize feelings will improve Ability to disclose and discuss suicidal ideas Ability to demonstrate  self-control will improve Ability to identify and develop effective coping behaviors will improve Ability to maintain clinical measurements within normal limits will improve Compliance with prescribed medications will improve Ability to identify triggers associated with substance abuse/mental health issues will improve  Medication Management: Evaluate patient's response, side effects, and tolerance of medication regimen.  Therapeutic Interventions: 1 to 1 sessions, Unit Group sessions and Medication administration.  Evaluation of Outcomes: Progressing  Physician Treatment Plan for Secondary Diagnosis: Active Problems:   Severe recurrent major depression without psychotic features (HCC)  Long Term Goal(s): Improvement in symptoms so as ready for discharge Improvement in symptoms so as ready for discharge   Short Term Goals: Ability to identify changes in lifestyle to reduce recurrence of condition will improve Ability to verbalize feelings will improve Ability to disclose and discuss suicidal ideas Ability to demonstrate self-control will improve Ability to identify and develop effective coping behaviors will improve Ability to maintain clinical measurements within normal limits will improve Compliance with prescribed medications will improve Ability to identify triggers associated with substance abuse/mental health issues will improve Ability to identify changes in lifestyle to reduce recurrence of condition will improve Ability to verbalize feelings will improve Ability to disclose and discuss suicidal ideas Ability to demonstrate self-control will improve Ability to identify and develop effective coping behaviors will improve Ability to maintain clinical measurements within normal limits will improve Compliance with prescribed medications will improve Ability to identify triggers associated with substance abuse/mental health issues will improve     Medication Management: Evaluate  patient's response, side effects, and tolerance of medication regimen.  Therapeutic Interventions: 1 to 1 sessions, Unit Group sessions and Medication administration.  Evaluation of Outcomes: Progressing   RN Treatment Plan for Primary Diagnosis: <principal problem not specified> Long Term Goal(s): Knowledge of disease and therapeutic regimen to maintain health will improve  Short Term Goals: Ability to verbalize feelings will improve, Ability to identify and develop effective coping behaviors will improve and Compliance with prescribed medications will improve  Medication Management: RN will administer medications as ordered by provider, will assess and evaluate patient's response and provide education to patient for prescribed medication. RN will report any adverse and/or side effects to prescribing provider.  Therapeutic Interventions: 1 on 1 counseling sessions, Psychoeducation, Medication administration, Evaluate responses to treatment, Monitor vital signs and CBGs as ordered, Perform/monitor CIWA, COWS, AIMS and Fall Risk screenings as ordered, Perform wound care treatments as ordered.  Evaluation of Outcomes: Progressing   LCSW Treatment Plan for Primary Diagnosis: <principal problem not specified> Long Term Goal(s): Safe transition to appropriate next level of care at discharge, Engage patient in therapeutic group addressing interpersonal concerns.  Short Term Goals: Engage patient in aftercare planning with referrals and resources, Increase social support, Identify triggers associated with mental health/substance abuse issues and Increase skills for wellness and recovery  Therapeutic Interventions: Assess for all discharge needs, 1 to 1 time with Social worker, Explore available resources and support systems, Assess for adequacy in community support network, Educate family and significant other(s) on suicide prevention, Complete Psychosocial Assessment, Interpersonal group  therapy.  Evaluation of Outcomes:  Progressing   Progress in Treatment: Attending groups: Yes. Participating in groups: Yes. Taking medication as prescribed: Yes. Toleration medication: Yes. Family/Significant other contact made: No, will contact:  Family Member Patient understands diagnosis: Yes. Discussing patient identified problems/goals with staff: Yes. Medical problems stabilized or resolved: Yes. Denies suicidal/homicidal ideation: Yes. Issues/concerns per patient self-inventory: No. Other:  New problem(s) identified: No, Describe:  None  New Short Term/Long Term Goal(s): "Medication management"  Patient Goals:   "Medication management"  Discharge Plan or Barriers: To discharge home with outpatient follow up.   Reason for Continuation of Hospitalization: Medication stabilization   Estimated Length of Stay: 1 day  Attendees: Patient: Marie Mckenzie 12/07/2017 11:16 AM  Physician: Corinna Gab, MD 12/07/2017 11:16 AM  Nursing: Hulan Amato, RN 12/07/2017 11:16 AM  RN Care Manager:Shay. Dreama Saa, LRT 12/07/2017 11:16 AM  Social Worker: Johny Shears, LCSWA 12/07/2017 11:16 AM  Recreational Therapist:  12/07/2017 11:16 AM  Other: Jake Shark, LCSW 12/07/2017 11:16 AM  Other: Damian Leavell, Chaplin 12/07/2017 11:16 AM  Other: 12/07/2017 11:16 AM    Scribe for Treatment Team: Johny Shears, LCSW 12/07/2017 11:16 AM
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
## 2017-12-07 NOTE — [facility] Group Notes (Signed)
[facility] Group Notes:  (Nursing/MHT/Case Management/Adjunct)  Date:  12/07/2017  Time:  9:34 AM  Type of Therapy:  Psychoeducational Skills  Participation Level:  Active  Participation Quality:  Appropriate, Attentive and Sharing  Affect:  Appropriate  Cognitive:  Appropriate and Oriented  Insight:  Good  Engagement in Group:  Engaged and Supportive  Modes of Intervention:  Discussion and Education  Summary of Progress/Problems:  Foy Guadalajara 12/07/2017, 9:34 AM

## 2017-12-07 NOTE — Plan of Care (Signed)
Working on Pharmacologist.Verbalize understanding of information received  concerning medications Patient  aware of information received from  staff referring to  education about unit programing .  Able to verbalize understanding.  Attending unit programing , verbalizing feelings  Voice no concerns around  sleep.  Voice no safety concerns .  Emotional and mental health status improving .   No  anger outburst , appropriate behavior  Encourage  to shower. Limited socialization   with peers and staff .     Problem: Education: Goal: Knowledge of [facility] Education information/materials will improve Outcome: Progressing Goal: Emotional status will improve Outcome: Progressing Goal: Mental status will improve Outcome: Progressing Goal: Verbalization of understanding the information provided will improve Outcome: Progressing   Problem: Health Behavior/Discharge Planning: Goal: Identification of resources available to assist in meeting health care needs will improve Outcome: Progressing Goal: Compliance with treatment plan for underlying cause of condition will improve Outcome: Progressing   Problem: Education: Goal: Knowledge of disease or condition will improve Outcome: Progressing Goal: Understanding of discharge needs will improve Outcome: Progressing   Problem: Safety: Goal: Ability to remain free from injury will improve Outcome: Progressing   Problem: Safety: Goal: Ability to remain free from injury will improve Outcome: Progressing

## 2017-12-07 NOTE — [facility] Suicide Risk Assessment (Signed)
[facility] INPATIENT:  Family/Significant Other Suicide Prevention Education  Suicide Prevention Education:  Education Completed; Pollyann Glen [phone] has been identified by the patient as the family member/significant other with whom the patient will be residing, and identified as the person(s) who will aid the patient in the event of a mental health crisis (suicidal ideations/suicide attempt).  With written consent from the patient, the family member/significant other has been provided the following suicide prevention education, prior to the and/or following the discharge of the patient.  The suicide prevention education provided includes the following:  Suicide risk factors  Suicide prevention and interventions  National Suicide Hotline telephone number  [facility] assessment telephone number  [facility] Emergency Assistance 911  [facility] and/or Residential Mobile Crisis Unit telephone number  Request made of family/significant other to:  Remove weapons (e.g., guns, rifles, knives), all items previously/currently identified as safety concern.    Remove drugs/medications (over-the-counter, prescriptions, illicit drugs), all items previously/currently identified as a safety concern.  The family member/significant other verbalizes understanding of the suicide prevention education information provided.  The family member/significant other agrees to remove the items of safety concern listed above.  No concerns were reported. He reports that either him or her mother will be coming at discharge. He reports that the patient doesn't have any access to any guns/weapons at home.  Johny Shears 12/07/2017, 2:47 PM

## 2017-12-07 NOTE — Progress Notes (Signed)
Recreation Therapy Notes  Date: 12/07/2017  Time: 9:30 am  Location: Craft Room  Behavioral response: Appropriate   Intervention Topic: Team Work  Discussion/Intervention:  Group content on today was focused on teamwork. The group identified what teamwork is. Individuals described who is a part of their team. Patients expressed why they thought teamwork is important. The group stated reasons why they thought it was easier to work with a Comptroller team. Individuals discussed some positives and negatives of working with a team. Patients gave examples of past experiences they had while working with a team. The group participated in the intervention "Story in a bag", patients were in groups and were able to test their skill in a team setting.  Clinical Observations/Feedback:  Patient attended group and stated an important part of team work is communication. She stated to work in a team one must have a common goal. Individual was social with peers and staff while participating in the intervention.  Kwamane Whack LRT/CTRS         Jamesha Ellsworth 12/07/2017 1:02 PM

## 2017-12-07 NOTE — Progress Notes (Signed)
   12/07/17 1040  Clinical Encounter Type  Visited With Patient  Visit Type Initial;Spiritual support;Behavioral Health  Referral From Social work  Consult/Referral To Chaplain  Spiritual Encounters  Spiritual Needs Emotional;Other (Comment)   CH attended the patient's treatment team meeting. The patient set her goals as getting medication better managed and to get her mind clear. She was living on her own and but will move to live with her mom as a temporary placement until her mom and boyfriends mom can help her get a new apartment.

## 2017-12-08 MED ORDER — CITALOPRAM HYDROBROMIDE 10 MG PO TABS: 10.0000 mg | ORAL_TABLET | Freq: Every day | ORAL | 0 refills | Status: DC

## 2017-12-08 MED ORDER — AMPHETAMINE-DEXTROAMPHETAMINE 20 MG PO TABS: 20.0000 mg | ORAL_TABLET | Freq: Every day | ORAL | 0 refills | Status: DC

## 2017-12-08 MED ORDER — TRAZODONE HCL 100 MG PO TABS: 100.0000 mg | ORAL_TABLET | Freq: Every evening | ORAL | 0 refills | Status: DC | PRN

## 2017-12-08 MED ORDER — HYDROXYZINE HCL 50 MG PO TABS: 50.0000 mg | ORAL_TABLET | Freq: Three times a day (TID) | ORAL | 0 refills | Status: DC | PRN

## 2017-12-08 MED ORDER — QUETIAPINE FUMARATE 100 MG PO TABS: 100.0000 mg | ORAL_TABLET | Freq: Every day | ORAL | 0 refills | Status: DC

## 2017-12-08 NOTE — Progress Notes (Signed)
  [facility] Adult Case Management Discharge Plan :  Will you be returning to the same living situation after discharge:  Yes,  Home with family At discharge, do you have transportation home?: Yes,  Mother coming at discharge Do you have the ability to pay for your medications: Yes,  Medicaid  Release of information consent forms completed and in the chart;  Patient's signature needed at discharge.  Patient to Follow up at: Follow-up Information    [facility] up on 01/04/2018.   Why:  Please follow up with Dr. Fannie Knee on Monday January 04, 2018 at 10:20am. Thank you. Contact information: [address] [place] [address] [phone]        [facility] Go on 12/15/2017.   Why:  Please follow up with [facility] if you need to see a provider before your scheduled appointment. Thank you. Contact information: [address] [place] [address] [phone]           Next level of care provider has access to [facility] Link:no  Safety Planning and Suicide Prevention discussed: Yes,  Completed with patient and friend  Have you used any form of tobacco in the last 30 days? (Cigarettes, Smokeless Tobacco, Cigars, and/or Pipes): Yes  Has patient been referred to the Quitline?: Patient refused referral  Patient has been referred for addiction treatment: N/A  Johny Shears, LCSW 12/08/2017, 8:25 AM

## 2017-12-08 NOTE — Discharge Summary (Signed)
Physician Discharge Summary Note  Patient:  Marie Mckenzie is an 27 y.o., female MRN:  [id number] DOB:  1990-09-04 Patient phone:  [phone] (home)  Patient address:   [address] [facility] ([place]) [id number],  Total Time spent with patient: 20 minutes  Plus 20 minutes of medication reconciliation, discharge planning, and discharge documentation   Date of Admission:  12/04/2017 Date of Discharge: 12/08/17  Reason for Admission:  Self harm by cutting  Principal Problem: Severe recurrent major depression without psychotic features [facility]) Discharge Diagnoses: Patient Active Problem List   Diagnosis Date Noted  . Severe recurrent major depression without psychotic features (HCC) [F33.2] 12/04/2017    Priority: High  . Self-inflicted laceration of wrist [S61.519A] 12/04/2017  . Hypokalemia [E87.6] 07/25/2016  . Elevated transaminase level [R74.0] 07/25/2016  . Leukopenia [D72.819] 07/25/2016  . Thrombocytopenia (HCC) [D69.6] 07/25/2016  . 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 Alcohol withdrawal (HCC) [F10.239] 07/24/2016  . Attention deficit hyperactivity disorder (ADHD) [F90.9] 02/11/2016  . Major depressive disorder, recurrent severe without psychotic features (HCC) [F33.2] 02/10/2016  . Suicidal ideation [R45.851] 02/10/2016  . Involuntary commitment [Z04.6] 02/10/2016  . Alcohol use disorder, severe, dependence (HCC) [F10.20] 02/10/2016  . Alcohol intoxication (HCC) [F10.929] 02/10/2016  . Substance induced mood disorder (HCC) [F19.94] 02/10/2016  . Tobacco use disorder [F17.200] 02/10/2016    Past Psychiatric History: See H&P  Past Medical History:  Past Medical History:  Diagnosis Date  . ADHD (attention deficit hyperactivity disorder)   . Anxiety   . Hypertension   . Major depressive disorder     Past Surgical History:  Procedure Laterality Date  . TONSILLECTOMY    . WISDOM TOOTH EXTRACTION     Family  History:  Family History  Problem Relation Age of Onset  . Hypertension Father    Family Psychiatric  History: See H&P Social History:  Social History   Substance and Sexual Activity  Alcohol Use Yes  . Alcohol/week: 23.0 - 37.0 standard drinks  . Types: 7 Cans of beer, 16 - 30 Shots of liquor per week     Social History   Substance and Sexual Activity  Drug Use No    Social History   Socioeconomic History  . Marital status: Single    Spouse name: Not on file  . Number of children: Not on file  . Years of education: Not on file  . Highest education level: Not on file  Occupational History  . Not on file  Social Needs  . Financial resource strain: Not on file  . Food insecurity:    Worry: Not on file    Inability: Not on file  . Transportation needs:    Medical: Not on file    Non-medical: Not on file  Tobacco Use  . Smoking status: Current Every Day Smoker    Packs/day: 1.00    Types: Cigarettes  . Smokeless tobacco: Never Used  . Tobacco comment: Pt has order for a Nicotine Patch  Substance and Sexual Activity  . Alcohol use: Yes    Alcohol/week: 23.0 - 37.0 standard drinks    Types: 7 Cans of beer, 16 - 30 Shots of liquor per week  . Drug use: No  . Sexual activity: Yes    Birth control/protection: Condom  Lifestyle  . Physical activity:    Days per week: Not on file    Minutes per session: Not on file  . Stress: Not on file  Relationships  .  Social connections:    Talks on phone: Not on file    Gets together: Not on file    Attends religious service: Not on file    Active member of club or organization: Not on file    Attends meetings of clubs or organizations: Not on file    Relationship status: Not on file  Other Topics Concern  . Not on file  Social History Narrative  . Not on file    Hospital Course:  Pt was restarted on home medications of Celexa, Seroquel and lower dose of Adderall. She participated very well in groups and unit activities.  She was very calm and pleasant on the unit. She was very forthcoming with information during her stay. She consistently denied SI or any thoughts of self harm. She reconnected with an old fiance and her mother who are very supportive. This really helped her mood. On day of discharge, she had bright affect. She was very excited to move forward. She plans to stay with her mother and she and her ex-fiance will help finance her own apartment. She wants to stick with seeing [name] and has an upcoming appointment this month. She was glad to be back on her medications. When asked today, she denied SI or any thoughts of self harm. She is sleeping well. She denies HI, AH, VH. Does not appear manic or psychotic. She was given 7 day supply of her medications. She plans o go to the medicaid office today to check on her medicaid status. She was very appreciative of the help she received on the unit. Safety plan was discussed in detail including calling the crisis line or coming back to ED if she were to feel unsafe.   The patient is at low risk of imminent suicide. Patient denied thoughts, intent, or plan for harm to self or others, expressed significant future orientation, and expressed an ability to mobilize assistance for her needs. She is presently void of any contributing psychiatric symptoms, cognitive difficulties, or substance use which would elevate her risk for lethality. Chronic risk for lethality is elevated in light of impulsivity, self harm, history of trauma. The chronic risk is presently mitigated by her ongoing desire and engagement in [facility] treatment and mobilization of support from family and friends. Chronic risk may elevate if she experiences any significant loss or worsening of symptoms, which can be managed and monitored through outpatient providers. At this time,a cute risk for lethality is low and she is stable for ongoing outpatient management.   Modifiable risk factors were addressed during this  hospitalization through appropriate pharmacotherapy and establishment of outpatient follow-up treatment. Some risk factors for suicide are situational (i.e. Unstable housing) or related personality pathology (i.e. Poor coping mechanisms) and thus cannot be further mitigated by continued hospitalization in this setting.    Physical Findings: AIMS:  , ,  ,  ,    CIWA:  CIWA-Ar Total: 2 COWS:     Musculoskeletal: Strength & Muscle Tone: within normal limits Gait & Station: normal Patient leans: N/A  Psychiatric Specialty Exam: Physical Exam  Nursing note and vitals reviewed.   Review of Systems  All other systems reviewed and are negative.   Blood pressure 110/80, pulse 98, temperature 97.7 F (36.5 C), temperature source Oral, resp. rate 17, height 5\' 6"  (1.676 m), weight 77.1 kg, SpO2 99 %.Body mass index is 27.44 kg/m.  General Appearance: Casual  Eye Contact:  Good  Speech:  Clear and Coherent  Volume:  Normal  Mood:  Euthymic  Affect:  Appropriate  Thought Process:  Coherent and Goal Directed  Orientation:  Full (Time, Place, and Person)  Thought Content:  Logical  Suicidal Thoughts:  No  Homicidal Thoughts:  No  Memory:  Recent;   Good  Judgement:  Good  Insight:  Good  Psychomotor Activity:  Normal  Concentration:  Concentration: Good  Recall:  Good  Fund of Knowledge:  Good  Language:  Good  Akathisia:  No      Assets:  Resilience  ADL's:  Intact  Cognition:  WNL  Sleep:  Number of Hours: 5.45     Have you used any form of tobacco in the last 30 days? (Cigarettes, Smokeless Tobacco, Cigars, and/or Pipes): Yes  Has this patient used any form of tobacco in the last 30 days? (Cigarettes, Smokeless Tobacco, Cigars, and/or Pipes) Yes, Yes, A prescription for an FDA-approved tobacco cessation medication was offered at discharge and the patient refused  Blood Alcohol level:  Lab Results  Component Value Date   ETH 215 (H) 12/04/2017   ETH <5 07/24/2016     Metabolic Disorder Labs:  Lab Results  Component Value Date   HGBA1C 4.9 12/05/2017   MPG 93.93 12/05/2017   No results found for: PROLACTIN Lab Results  Component Value Date   CHOL 134 12/05/2017   TRIG 66 12/05/2017   HDL 52 12/05/2017   CHOLHDL 2.6 12/05/2017   VLDL 13 12/05/2017   LDLCALC 69 12/05/2017    See Psychiatric Specialty Exam and Suicide Risk Assessment completed by Attending Physician prior to discharge.  Discharge destination:  Home  Is patient on multiple antipsychotic therapies at discharge:  No   Has Patient had three or more failed trials of antipsychotic monotherapy by history:  No  Recommended Plan for Multiple Antipsychotic Therapies: NA  Discharge Instructions    Increase activity slowly   Complete by:  As directed      Allergies as of 12/08/2017   No Known Allergies     Medication List    STOP taking these medications   disulfiram 250 MG tablet Commonly known as:  ANTABUSE   nicotine 21 mg/24hr patch Commonly known as:  NICODERM CQ - dosed in mg/24 hours     TAKE these medications     Indication  amphetamine-dextroamphetamine 20 MG tablet Commonly known as:  ADDERALL Take 1 tablet (20 mg total) by mouth daily. What changed:  when to take this  Indication:  Attention Deficit Hyperactivity Disorder   citalopram 10 MG tablet Commonly known as:  CELEXA Take 1 tablet (10 mg total) by mouth daily.  Indication:  Generalized Anxiety Disorder   folic acid 1 MG tablet Commonly known as:  FOLVITE Take 1 tablet (1 mg total) by mouth daily.  Indication:  Anemia From Inadequate Folic Acid   hydrOXYzine 50 MG tablet Commonly known as:  ATARAX/VISTARIL Take 1 tablet (50 mg total) by mouth 3 (three) times daily as needed for anxiety.  Indication:  Feeling Anxious   QUEtiapine 100 MG tablet Commonly known as:  SEROQUEL Take 1 tablet (100 mg total) by mouth at bedtime. What changed:  how much to take  Indication:  Major Depressive  Disorder   thiamine 100 MG tablet Take 1 tablet (100 mg total) by mouth daily. What changed:    medication strength  how much to take  Indication:  Deficiency of Vitamin B1   traZODone 100 MG tablet Commonly known as:  DESYREL Take 1  tablet (100 mg total) by mouth at bedtime as needed for sleep.  Indication:  Trouble Sleeping      Follow-up Information    [facility] up on 01/04/2018.   Why:  Please follow up with Dr. Fannie Knee on Monday January 04, 2018 at 10:20am. Thank you. Contact information: [address] [place] [address] [phone]        [facility] Go on 12/15/2017.   Why:  Please follow up with [facility] if you need to see a provider before your scheduled appointment. Thank you. Contact information: [address] [place] [address] [phone]            Signed: Haskell Riling, MD 12/08/2017, 2:30 PM

## 2017-12-08 NOTE — [facility] Suicide Risk Assessment (Signed)
[facility] Discharge Suicide Risk Assessment   Principal Problem: Severe recurrent major depression without psychotic features (HCC)    Mental Status Per Nursing Assessment::   On Admission:  NA  Demographic Factors:  Caucasian and Unemployed  Loss Factors: Financial problems/change in socioeconomic status  Historical Factors: Impulsivity  Risk Reduction Factors:   Living with another person, especially a relative, Positive social support, Positive therapeutic relationship and Positive coping skills or problem solving skills  Continued Clinical Symptoms:  None  Cognitive Features That Contribute To Risk:  None    Suicide Risk:  Minimal Acute Risk: No identifiable suicidal ideation.   Follow-up Information    [facility] Follow up on 01/04/2018.   Why:  Please follow up with Dr. Fannie Knee on Monday January 04, 2018 at 10:20am. Thank you. Contact information: [address] [place] [address] [phone]        [facility] Go on 12/15/2017.   Why:  Please follow up with [facility] if you need to see a provider before your scheduled appointment. Thank you. Contact information: [address] [place] [address] [phone]          Haskell Riling, MD 12/08/2017, 9:12 AM

## 2017-12-08 NOTE — Progress Notes (Signed)
D: Pt denies SI/HI/AVH. Pt is pleasant and cooperative. Pt. has no Complaints.  Patient Interactions appropriate. Pt. Socializing frequently with staff and peers. Eats good snack and goes to bed.   A: Q x 15 minute observation checks were completed for safety. Patient was provided with education.  Patient was given/offered medications per orders. Patient  was encourage to attend groups, participate in unit activities and continue with plan of care. Pt. Chart and plans of care reviewed. Pt. Given support and encouragement.   R: Patient is complaint with medication and unit procedures.             Precautionary checks every 15 minutes for safety maintained, room free of safety hazards, patient sustains no injury or falls during this shift. Will endorse care to next shift.

## 2017-12-08 NOTE — Plan of Care (Signed)
Pt. Verbalizes understanding of provided education. Pt. Reports improved emotional status and mood. Pt. Complaint with medications. Pt. Denies Si/hi, verbally is able to contract for safety.    Problem: Education: Goal: Knowledge of [facility] Education information/materials will improve Outcome: Progressing Goal: Emotional status will improve Outcome: Progressing Goal: Mental status will improve Outcome: Progressing   Problem: Health Behavior/Discharge Planning: Goal: Compliance with treatment plan for underlying cause of condition will improve Outcome: Progressing   Problem: Safety: Goal: Ability to remain free from injury will improve Outcome: Progressing

## 2017-12-08 NOTE — Progress Notes (Signed)
D: Patient is aware of  Discharge this shift .Patient denies suicidal /homicidal ideations. Patient received all belongings brought in  A: No Storage medications. Writer reviewed Discharge Summary, Suicide Risk Assessment, and Transitional Record. Patient also received Prescriptions   from  MD. A 7 day supply of medications given to patient  A: . Aware  Of follow up appointment . R: Patient left unit with no questions  Or concerns  With mother

## 2018-01-07 ENCOUNTER — Other Ambulatory Visit: Payer: Self-pay

## 2018-01-07 ENCOUNTER — Encounter: Payer: Self-pay | Admitting: *Deleted

## 2018-01-07 ENCOUNTER — Emergency Department
Admission: EM | Admit: 2018-01-07 | Discharge: 2018-01-08 | Disposition: A | Discharge status: Home / Self Care | Payer: Medicaid Other | Attending: Emergency Medicine | Admitting: Emergency Medicine

## 2018-01-07 DIAGNOSIS — F332 Major depressive disorder, recurrent severe without psychotic features: Secondary | ICD-10-CM | POA: Diagnosis not present

## 2018-01-07 DIAGNOSIS — F419 Anxiety disorder, unspecified: Secondary | ICD-10-CM | POA: Diagnosis not present

## 2018-01-07 DIAGNOSIS — R451 Restlessness and agitation: Secondary | ICD-10-CM

## 2018-01-07 DIAGNOSIS — Z046 Encounter for general psychiatric examination, requested by authority: Secondary | ICD-10-CM | POA: Insufficient documentation

## 2018-01-07 DIAGNOSIS — F329 Major depressive disorder, single episode, unspecified: Secondary | ICD-10-CM | POA: Insufficient documentation

## 2018-01-07 DIAGNOSIS — F17210 Nicotine dependence, cigarettes, uncomplicated: Secondary | ICD-10-CM | POA: Diagnosis not present

## 2018-01-07 DIAGNOSIS — F1510 Other stimulant abuse, uncomplicated: Secondary | ICD-10-CM

## 2018-01-07 DIAGNOSIS — I10 Essential (primary) hypertension: Secondary | ICD-10-CM | POA: Insufficient documentation

## 2018-01-07 DIAGNOSIS — Z79899 Other long term (current) drug therapy: Secondary | ICD-10-CM | POA: Diagnosis not present

## 2018-01-07 DIAGNOSIS — Z72820 Sleep deprivation: Secondary | ICD-10-CM | POA: Insufficient documentation

## 2018-01-07 LAB — COMPREHENSIVE METABOLIC PANEL
ALT: 19 U/L (ref 0–44)
AST: 46 U/L — AB (ref 15–41)
Albumin: 4.8 g/dL (ref 3.5–5.0)
Alkaline Phosphatase: 81 U/L (ref 38–126)
Anion gap: 12 (ref 5–15)
BUN: 13 mg/dL (ref 6–20)
CALCIUM: 9.3 mg/dL (ref 8.9–10.3)
CO2: 23 mmol/L (ref 22–32)
CREATININE: 0.97 mg/dL (ref 0.44–1.00)
Chloride: 108 mmol/L (ref 98–111)
GFR calc Af Amer: >60 mL/min (ref >60)
Glucose, Bld: 109 mg/dL — ABNORMAL HIGH (ref 70–99)
POTASSIUM: 3.3 mmol/L — AB (ref 3.5–5.1)
Sodium: 143 mmol/L (ref 135–145)
Total Bilirubin: 0.9 mg/dL (ref 0.3–1.2)
Total Protein: 7.9 g/dL (ref 6.5–8.1)

## 2018-01-07 LAB — CBC
HCT: 38.2 % (ref 36.0–46.0)
Hemoglobin: 13.2 g/dL (ref 12.0–15.0)
MCH: 32.7 pg (ref 26.0–34.0)
MCHC: 34.6 g/dL (ref 30.0–36.0)
MCV: 94.6 fL (ref 80.0–100.0)
PLATELETS: 286 10*3/uL (ref 150–400)
RBC: 4.04 MIL/uL (ref 3.87–5.11)
RDW: 13.3 % (ref 11.5–15.5)
WBC: 13.3 10*3/uL — ABNORMAL HIGH (ref 4.0–10.5)
nRBC: 0 % (ref 0.0–0.2)

## 2018-01-07 LAB — URINE DRUG SCREEN, QUALITATIVE (ARMC ONLY)
Amphetamines, Ur Screen: POSITIVE — AB
BARBITURATES, UR SCREEN: NOT DETECTED
Benzodiazepine, Ur Scrn: NOT DETECTED
CANNABINOID 50 NG, UR ~~LOC~~: NOT DETECTED
Cocaine Metabolite,Ur ~~LOC~~: NOT DETECTED
MDMA (Ecstasy)Ur Screen: NOT DETECTED
Methadone Scn, Ur: NOT DETECTED
Opiate, Ur Screen: NOT DETECTED
PHENCYCLIDINE (PCP) UR S: NOT DETECTED
TRICYCLIC, UR SCREEN: NOT DETECTED

## 2018-01-07 LAB — ETHANOL: ALCOHOL ETHYL (B): 35 mg/dL — AB (ref <10)

## 2018-01-07 LAB — POC URINE PREG, ED: PREG TEST UR: NEGATIVE

## 2018-01-07 MED ORDER — LORAZEPAM 2 MG PO TABS
2.0000 mg | ORAL_TABLET | Freq: Once | ORAL | Status: DC
  Filled 2018-01-07: qty 1

## 2018-01-07 MED ORDER — LORAZEPAM 2 MG/ML IJ SOLN
2.0000 mg | Freq: Once | INTRAMUSCULAR | Status: AC
  Administered 2018-01-07: 2 mg via INTRAMUSCULAR
  Filled 2018-01-07: qty 1

## 2018-01-07 MED ORDER — QUETIAPINE FUMARATE 25 MG PO TABS: 25.0000 mg | ORAL_TABLET | ORAL | Status: DC | PRN

## 2018-01-07 MED ORDER — QUETIAPINE FUMARATE 25 MG PO TABS
50.0000 mg | ORAL_TABLET | Freq: Once | ORAL | Status: AC
  Administered 2018-01-07: 50 mg via ORAL
  Filled 2018-01-07: qty 2

## 2018-01-07 NOTE — ED Notes (Signed)
Pt also reporting she had a "pill" abortion and was [redacted] weeks pregnant and is currently having vaginal bleeding.

## 2018-01-07 NOTE — ED Provider Notes (Signed)
[facility] Emergency Department Provider Note   ____________________________________________   First MD Initiated Contact with Patient 01/07/18 2121     (approximate)  I have reviewed the triage vital signs and the nursing notes.   HISTORY  Chief Complaint voluntary   HPI Marie Mckenzie is a 27 y.o. female who reports she has not slept for 4 days.  She is very agitated.  She cannot lay still on the bed.  She says she took the abortion pill in September and that is all finished and she is now having her menstrual period.  Her pregnancy test is negative/10 to believe her.  Tox screen is positive for amphetamines.  We will give her some Ativan and see if that can help her relax.  I am not sure if she took extra for Adderall or exactly what happened.   Past Medical History:  Diagnosis Date  . ADHD (attention deficit hyperactivity disorder)   . Anxiety   . Hypertension   . Major depressive disorder     Patient Active Problem List   Diagnosis Date Noted  . Self-inflicted laceration of wrist 12/04/2017  . Severe recurrent major depression without psychotic features (HCC) 12/04/2017  . Hypokalemia 07/25/2016  . Elevated transaminase level 07/25/2016  . Leukopenia 07/25/2016  . Thrombocytopenia (HCC) 07/25/2016  . 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 Alcohol withdrawal (HCC) 07/24/2016  . Attention deficit hyperactivity disorder (ADHD) 02/11/2016  . Major depressive disorder, recurrent severe without psychotic features (HCC) 02/10/2016  . Suicidal ideation 02/10/2016  . Involuntary commitment 02/10/2016  . Alcohol use disorder, severe, dependence (HCC) 02/10/2016  . Alcohol intoxication (HCC) 02/10/2016  . Substance induced mood disorder (HCC) 02/10/2016  . Tobacco use disorder 02/10/2016    Past Surgical History:  Procedure Laterality Date  . TONSILLECTOMY    . WISDOM TOOTH EXTRACTION      Prior  to Admission medications   Medication Sig Start Date End Date Taking? Authorizing Provider  amphetamine-dextroamphetamine (ADDERALL) 20 MG tablet Take 1 tablet (20 mg total) by mouth daily. 12/08/17   McNew, Ileene Hutchinson, MD  citalopram (CELEXA) 10 MG tablet Take 1 tablet (10 mg total) by mouth daily. 12/08/17   McNew, Ileene Hutchinson, MD  folic acid (FOLVITE) 1 MG tablet Take 1 tablet (1 mg total) by mouth daily. Patient not taking: Reported on 12/04/2017 07/26/16   Katharina Caper, MD  hydrOXYzine (ATARAX/VISTARIL) 50 MG tablet Take 1 tablet (50 mg total) by mouth 3 (three) times daily as needed for anxiety. 12/08/17   McNew, Ileene Hutchinson, MD  QUEtiapine (SEROQUEL) 100 MG tablet Take 1 tablet (100 mg total) by mouth at bedtime. 12/08/17   McNew, Ileene Hutchinson, MD  thiamine 100 MG tablet Take 1 tablet (100 mg total) by mouth daily. 12/08/17   McNew, Ileene Hutchinson, MD  traZODone (DESYREL) 100 MG tablet Take 1 tablet (100 mg total) by mouth at bedtime as needed for sleep. 12/08/17   McNew, Ileene Hutchinson, MD    Allergies Patient has no known allergies.  Family History  Problem Relation Age of Onset  . Hypertension Father     Social History Social History   Tobacco Use  . Smoking status: Current Every Day Smoker    Packs/day: 1.00    Types: Cigarettes  . Smokeless tobacco: Never Used  . Tobacco comment: Pt has order for a Nicotine Patch  Substance Use Topics  . Alcohol use: Yes    Alcohol/week: 23.0 - 37.0  standard drinks    Types: 7 Cans of beer, 16 - 30 Shots of liquor per week  . Drug use: No    Review of Systems  Constitutional: No fever/chills Eyes: No visual changes. ENT: No sore throat. Cardiovascular: Denies chest pain. Respiratory: Denies shortness of breath. Gastrointestinal: No abdominal pain.  No nausea, no vomiting.  No diarrhea.  No constipation. Genitourinary: Negative for dysuria. Musculoskeletal: Negative for back pain. Skin: Negative for rash. Neurological: Negative for headaches, focal weakness    ____________________________________________   PHYSICAL EXAM:  VITAL SIGNS: ED Triage Vitals  Enc Vitals Group     BP 01/07/18 1737 127/82     Pulse Rate 01/07/18 1737 (!) 121     Resp 01/07/18 1737 20     Temp 01/07/18 1737 98.1 F (36.7 C)     Temp Source 01/07/18 1737 Oral     SpO2 01/07/18 1737 98 %     Weight 01/07/18 1738 180 lb (81.6 kg)     Height 01/07/18 1738 5\' 6"  (1.676 m)     Head Circumference --      Peak Flow --      Pain Score 01/07/18 1738 0     Pain Loc --      Pain Edu? --      Excl. in GC? --     Constitutional: Alert and oriented. Well appearing and in no acute distress. Eyes: Conjunctivae are normal. PERRL. EOMI. Head: Atraumatic. Nose: No congestion/rhinnorhea. Mouth/Throat: Mucous membranes are moist.  Oropharynx non-erythematous. Neck: No stridor.   Cardiovascular: Normal rate, regular rhythm. Grossly normal heart sounds.  Good peripheral circulation. Respiratory: Normal respiratory effort.  No retractions. Lungs CTAB. Gastrointestinal: Soft and nontender. No distention. No abdominal bruits. No CVA tenderness. Musculoskeletal: No lower extremity tenderness nor edema.   Neurologic:  Normal speech and language. No gross focal neurologic deficits are appreciated. No gait instability. Skin:  Skin is warm, dry and intact. No rash noted. Psychiatric: Patient seems very agitated.  ____________________________________________   LABS (all labs ordered are listed, but only abnormal results are displayed)  Labs Reviewed  COMPREHENSIVE METABOLIC PANEL - Abnormal; Notable for the following components:      Result Value   Potassium 3.3 (*)    Glucose, Bld 109 (*)    AST 46 (*)    All other components within normal limits  ETHANOL - Abnormal; Notable for the following components:   Alcohol, Ethyl (B) 35 (*)    All other components within normal limits  CBC - Abnormal; Notable for the following components:   WBC 13.3 (*)    All other components  within normal limits  URINE DRUG SCREEN, QUALITATIVE ([facility] ONLY) - Abnormal; Notable for the following components:   Amphetamines, Ur Screen POSITIVE (*)    All other components within normal limits  POC URINE PREG, ED   ____________________________________________  EKG   ____________________________________________  RADIOLOGY  ED MD interpretation:    Official radiology report(s): No results found.  ____________________________________________   PROCEDURES  Procedure(s) performed:   Procedures  Critical Care performed:   ____________________________________________   INITIAL IMPRESSION / ASSESSMENT AND PLAN / ED COURSE  SOC recent recommends admission.  SOC also recommends no Adderall while she is here.  And Seroquel 25 mg p.o. every 4 hours as needed anxiety or agitation with a dose of 50 mg now.  They feel she has not meet criteria for involuntary admission.     ____________________________________________   FINAL CLINICAL IMPRESSION(S) /  ED DIAGNOSES  Final diagnoses:  Agitation     ED Discharge Orders    None       Note:  This document was prepared using Dragon voice recognition software and may include unintentional dictation errors.    Arnaldo Natal, MD 01/07/18 [phone]

## 2018-01-07 NOTE — ED Notes (Signed)
ED Provider at bedside. 

## 2018-01-07 NOTE — ED Notes (Signed)
Pt placed shoes with no laces, jeans, bra, underwear, and black shirt in belongings bag. No valuables.

## 2018-01-07 NOTE — ED Notes (Signed)
Mothers number is [phone]

## 2018-01-07 NOTE — ED Notes (Signed)
Report received from [place], [place]. Q15 minute rounding sheet taken over. Patient is currently sleeping at this time. Bed is locked in lowest position, environment secured. Will continue to monitor.

## 2018-01-07 NOTE — ED Notes (Signed)
Pt verbalizing  "I have been very anxious - I need to be admitted to the inpatient unit - I have not been doing good - I drank a beer today about 2 but that is all - I have not slept in four days."    Continue to monitor

## 2018-01-07 NOTE — ED Triage Notes (Signed)
T to ED after not taking her medications for the past four days and has not been able to sleep. Pt is fighting in triage and unable to sit still. Pt is cooperative.   Hx of anxiety, panic disorder, depression and ADHD.   Pt denies SI/HI, delusions, No drug use and social drinking. Hx of alcohol abuse but family rpeorts "pt does not drink like she used to" last beer was at 14:00 today.
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
## 2018-01-07 NOTE — ED Notes (Signed)
Report given to DR [facility] doctor).

## 2018-01-07 NOTE — ED Notes (Signed)
TTS at bedside. 

## 2018-01-07 NOTE — ED Notes (Signed)
Patient irritable and frigidity stating, "If I'm not going to get my medicine I just want to go home.  I haven't slept in 4 days and I need my meds."

## 2018-01-07 NOTE — ED Notes (Signed)
SOC in progress.  

## 2018-01-08 DIAGNOSIS — F332 Major depressive disorder, recurrent severe without psychotic features: Secondary | ICD-10-CM

## 2018-01-08 DIAGNOSIS — F1510 Other stimulant abuse, uncomplicated: Secondary | ICD-10-CM

## 2018-01-08 MED ORDER — IBUPROFEN 600 MG PO TABS
600.0000 mg | ORAL_TABLET | Freq: Once | ORAL | Status: AC
  Administered 2018-01-08: 600 mg via ORAL
  Filled 2018-01-08: qty 1

## 2018-01-08 MED ORDER — CITALOPRAM HYDROBROMIDE 20 MG PO TABS
10.0000 mg | ORAL_TABLET | Freq: Every day | ORAL | Status: DC
  Administered 2018-01-08: 10 mg via ORAL
  Filled 2018-01-08: qty 1

## 2018-01-08 MED ORDER — TRAZODONE HCL 100 MG PO TABS: 100.0000 mg | ORAL_TABLET | Freq: Every day | ORAL | Status: DC

## 2018-01-08 MED ORDER — HYDROXYZINE HCL 25 MG PO TABS
50.0000 mg | ORAL_TABLET | Freq: Four times a day (QID) | ORAL | Status: DC | PRN
  Administered 2018-01-08: 50 mg via ORAL
  Filled 2018-01-08: qty 2

## 2018-01-08 MED ORDER — QUETIAPINE FUMARATE 25 MG PO TABS: 100.0000 mg | ORAL_TABLET | Freq: Every day | ORAL | Status: DC

## 2018-01-08 NOTE — ED Notes (Addendum)
Pt to be discharged. Remains calm and cooperative.  Maintained on 15 minute checks and observation by security camera for safety.

## 2018-01-08 NOTE — ED Provider Notes (Signed)
-----------------------------------------   2:15 PM on 01/08/2018 -----------------------------------------   Blood pressure 127/82, pulse (!) 121, temperature 98.1 F (36.7 C), temperature source Oral, resp. rate 20, height 5\' 6"  (1.676 m), weight 81.6 kg, last menstrual period 01/07/2018, SpO2 98 %.  The patient had no acute events since last update.  Calm and cooperative at this time.  Patient without any suicidal or homicidal ideation at this time.  Evaluated by Dr. Toni Amend and deemed appropriate for outpatient follow-up.  Suspicion for substance abuse of the patient denies this and claims that it is that the Adderall that she is taking is making the amphetamine positive in her UDS.  Will be referred to [facility].    Myrna Blazer, MD 01/08/18 1416

## 2018-01-08 NOTE — ED Notes (Signed)
Hourly rounding reveals patient sleeping in room. No complaints, stable, in no acute distress. Q15 minute rounds and monitoring via Security Cameras to continue. 

## 2018-01-08 NOTE — ED Notes (Signed)
Pt has left a message for her mother to pick her up from the hospital.   Maintained on 15 minute checks and observation by security camera for safety.

## 2018-01-08 NOTE — ED Notes (Signed)
Pt. Transferred to [facility] from ED to room 5 after screening for contraband. Report to include Situation, Background, Assessment and Recommendations from Kenisha RN. Pt. Oriented to unit including Q15 minute rounds as well as the security cameras for their protection. Patient is alert and oriented, warm and dry in no acute distress. Patient denies SI, HI, and AVH. Pt. Encouraged to let me know if needs arise. 
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
## 2018-01-08 NOTE — ED Notes (Signed)
Breakfast tray placed in room ?

## 2018-01-08 NOTE — ED Notes (Signed)
Pt to nurses station. "I need my meds. Its taking my mom a long time to respond."  Pt endorses feeling anxious, shaky and panicked.  Psychiatrist made aware of pt request.   Maintained on 15 minute checks and observation by security camera for safety.

## 2018-01-08 NOTE — ED Notes (Signed)
RN attempted to contact patient's mother. Unable to leave a message.

## 2018-02-10 ENCOUNTER — Other Ambulatory Visit: Payer: Self-pay

## 2018-02-10 ENCOUNTER — Encounter: Payer: Self-pay | Admitting: Emergency Medicine

## 2018-02-10 ENCOUNTER — Emergency Department
Admission: EM | Admit: 2018-02-10 | Discharge: 2018-02-11 | Disposition: A | Discharge status: Other Acute Inpatient Hospital | Payer: Self-pay | Attending: Emergency Medicine | Admitting: Emergency Medicine

## 2018-02-10 DIAGNOSIS — F1910 Other psychoactive substance abuse, uncomplicated: Secondary | ICD-10-CM | POA: Insufficient documentation

## 2018-02-10 DIAGNOSIS — F1020 Alcohol dependence, uncomplicated: Secondary | ICD-10-CM | POA: Diagnosis present

## 2018-02-10 DIAGNOSIS — F17210 Nicotine dependence, cigarettes, uncomplicated: Secondary | ICD-10-CM | POA: Insufficient documentation

## 2018-02-10 DIAGNOSIS — F909 Attention-deficit hyperactivity disorder, unspecified type: Secondary | ICD-10-CM | POA: Diagnosis present

## 2018-02-10 DIAGNOSIS — F10929 Alcohol use, unspecified with intoxication, unspecified: Secondary | ICD-10-CM | POA: Diagnosis present

## 2018-02-10 DIAGNOSIS — F1510 Other stimulant abuse, uncomplicated: Secondary | ICD-10-CM | POA: Diagnosis present

## 2018-02-10 DIAGNOSIS — F329 Major depressive disorder, single episode, unspecified: Secondary | ICD-10-CM | POA: Insufficient documentation

## 2018-02-10 DIAGNOSIS — Z046 Encounter for general psychiatric examination, requested by authority: Secondary | ICD-10-CM | POA: Insufficient documentation

## 2018-02-10 DIAGNOSIS — R45851 Suicidal ideations: Secondary | ICD-10-CM | POA: Insufficient documentation

## 2018-02-10 DIAGNOSIS — Z79899 Other long term (current) drug therapy: Secondary | ICD-10-CM | POA: Insufficient documentation

## 2018-02-10 DIAGNOSIS — I10 Essential (primary) hypertension: Secondary | ICD-10-CM | POA: Insufficient documentation

## 2018-02-10 DIAGNOSIS — F3111 Bipolar disorder, current episode manic without psychotic features, mild: Secondary | ICD-10-CM

## 2018-02-10 LAB — COMPREHENSIVE METABOLIC PANEL
ALT: 15 U/L (ref 0–44)
AST: 27 U/L (ref 15–41)
Albumin: 4.8 g/dL (ref 3.5–5.0)
Alkaline Phosphatase: 74 U/L (ref 38–126)
Anion gap: 14 (ref 5–15)
BUN: 18 mg/dL (ref 6–20)
CO2: 21 mmol/L — ABNORMAL LOW (ref 22–32)
Calcium: 9.1 mg/dL (ref 8.9–10.3)
Chloride: 105 mmol/L (ref 98–111)
Creatinine, Ser: 0.88 mg/dL (ref 0.44–1.00)
GFR calc non Af Amer: >60 mL/min (ref >60)
Glucose, Bld: 82 mg/dL (ref 70–99)
Potassium: 4.4 mmol/L (ref 3.5–5.1)
Sodium: 140 mmol/L (ref 135–145)
Total Bilirubin: 1 mg/dL (ref 0.3–1.2)
Total Protein: 8 g/dL (ref 6.5–8.1)

## 2018-02-10 LAB — SALICYLATE LEVEL

## 2018-02-10 LAB — URINE DRUG SCREEN, QUALITATIVE (ARMC ONLY)
Amphetamines, Ur Screen: POSITIVE — AB
Barbiturates, Ur Screen: NOT DETECTED
Benzodiazepine, Ur Scrn: NOT DETECTED
Cannabinoid 50 Ng, Ur ~~LOC~~: NOT DETECTED
Cocaine Metabolite,Ur ~~LOC~~: NOT DETECTED
MDMA (ECSTASY) UR SCREEN: NOT DETECTED
Methadone Scn, Ur: NOT DETECTED
Opiate, Ur Screen: NOT DETECTED
Phencyclidine (PCP) Ur S: NOT DETECTED
TRICYCLIC, UR SCREEN: POSITIVE — AB

## 2018-02-10 LAB — CBC
HCT: 38 % (ref 36.0–46.0)
Hemoglobin: 12.6 g/dL (ref 12.0–15.0)
MCH: 32.1 pg (ref 26.0–34.0)
MCHC: 33.2 g/dL (ref 30.0–36.0)
MCV: 96.7 fL (ref 80.0–100.0)
Platelets: 282 10*3/uL (ref 150–400)
RBC: 3.93 MIL/uL (ref 3.87–5.11)
RDW: 13.7 % (ref 11.5–15.5)
WBC: 10.5 10*3/uL (ref 4.0–10.5)
nRBC: 0 % (ref 0.0–0.2)

## 2018-02-10 LAB — ACETAMINOPHEN LEVEL

## 2018-02-10 LAB — ETHANOL: Alcohol, Ethyl (B): 131 mg/dL — ABNORMAL HIGH (ref <10)

## 2018-02-10 MED ORDER — TRAZODONE HCL 100 MG PO TABS
100.0000 mg | ORAL_TABLET | Freq: Every day | ORAL | Status: DC
  Filled 2018-02-10: qty 1

## 2018-02-10 MED ORDER — CITALOPRAM HYDROBROMIDE 20 MG PO TABS: 10.0000 mg | ORAL_TABLET | Freq: Every day | ORAL | Status: DC

## 2018-02-10 MED ORDER — QUETIAPINE FUMARATE 25 MG PO TABS
100.0000 mg | ORAL_TABLET | Freq: Every day | ORAL | Status: DC
  Administered 2018-02-10: 100 mg via ORAL
  Filled 2018-02-10: qty 4

## 2018-02-10 MED ORDER — VITAMIN B-1 100 MG PO TABS: 100.0000 mg | ORAL_TABLET | Freq: Every day | ORAL | Status: DC
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
## 2018-02-10 NOTE — ED Notes (Signed)
Pt. Transferred to [facility] from ED to room 5 after screening for contraband. Report to include Situation, Background, Assessment and Recommendations from Hewan RN. Pt. Oriented to unit including Q15 minute rounds as well as the security cameras for their protection. Patient is alert and oriented, warm and dry in no acute distress. Patient denies SI, HI, and AVH. Pt. Encouraged to let me know if needs arise. 

## 2018-02-10 NOTE — ED Notes (Signed)
Hourly rounding reveals patient sleeping in room. No complaints, stable, in no acute distress. Q15 minute rounds and monitoring via Security Cameras to continue. 
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
## 2018-02-10 NOTE — ED Notes (Signed)
Report to include Situation, Background, Assessment, and Recommendations received from Amy RN. Patient alert and oriented, warm and dry, in no acute distress. Patient denies SI, HI, AVH and pain. Patient made aware of Q15 minute rounds and Rover and Officer presence for their safety. Patient instructed to come to me with needs or concerns.   

## 2018-02-10 NOTE — ED Notes (Signed)
Pt dressed out into appropriate behavioral health clothing with this tech and Ashley,RN in the rm. Pt belongings consist of a white eyebrow ring, a white lip ring, black shoes, black shirt, black jacket, black pants, black purse, black bra, pink socks and a pair of tan panties. Pt calm and cooperative while dressing out.

## 2018-02-10 NOTE — ED Notes (Signed)
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
## 2018-02-10 NOTE — BH Assessment (Signed)
Patient is to be [facility] by Dr. Toni Amendlapacs.  Attending Physician will be Dr. Chari ManningMcNeal.   Patient [facility] [place].   Intake Paper Work has been signed and placed on patient chart.  ER staff is aware of the admission:  [facility] Secretary    Dr. Don PerkingVeronese, ER MD   Jillyn HiddenGary Patient's Nurse   Nicole Cellaorothy Patient Access.  Please call report at 10pm

## 2018-02-10 NOTE — ED Notes (Signed)
POC performed and resulted as NEGATIVE. Meter could not be found to upload results.
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
## 2018-02-11 ENCOUNTER — Inpatient Hospital Stay
Admission: AD | Admit: 2018-02-11 | Discharge: 2018-02-15 | DRG: 885 | Disposition: A | Discharge status: Home / Self Care | Payer: No Typology Code available for payment source | Attending: Psychiatry | Admitting: Psychiatry

## 2018-02-11 DIAGNOSIS — K08409 Partial loss of teeth, unspecified cause, unspecified class: Secondary | ICD-10-CM | POA: Diagnosis present

## 2018-02-11 DIAGNOSIS — Z91128 Patient's intentional underdosing of medication regimen for other reason: Secondary | ICD-10-CM

## 2018-02-11 DIAGNOSIS — Z9114 Patient's other noncompliance with medication regimen: Secondary | ICD-10-CM

## 2018-02-11 DIAGNOSIS — Z590 Homelessness: Secondary | ICD-10-CM

## 2018-02-11 DIAGNOSIS — Z91410 Personal history of adult physical and sexual abuse: Secondary | ICD-10-CM | POA: Diagnosis not present

## 2018-02-11 DIAGNOSIS — Z79899 Other long term (current) drug therapy: Secondary | ICD-10-CM

## 2018-02-11 DIAGNOSIS — R45851 Suicidal ideations: Secondary | ICD-10-CM | POA: Diagnosis present

## 2018-02-11 DIAGNOSIS — T43596A Underdosing of other antipsychotics and neuroleptics, initial encounter: Secondary | ICD-10-CM | POA: Diagnosis present

## 2018-02-11 DIAGNOSIS — T43226A Underdosing of selective serotonin reuptake inhibitors, initial encounter: Secondary | ICD-10-CM | POA: Diagnosis present

## 2018-02-11 DIAGNOSIS — Z915 Personal history of self-harm: Secondary | ICD-10-CM

## 2018-02-11 DIAGNOSIS — I10 Essential (primary) hypertension: Secondary | ICD-10-CM | POA: Diagnosis present

## 2018-02-11 DIAGNOSIS — F3112 Bipolar disorder, current episode manic without psychotic features, moderate: Secondary | ICD-10-CM | POA: Diagnosis present

## 2018-02-11 DIAGNOSIS — R9431 Abnormal electrocardiogram [ECG] [EKG]: Secondary | ICD-10-CM | POA: Diagnosis present

## 2018-02-11 DIAGNOSIS — F1510 Other stimulant abuse, uncomplicated: Secondary | ICD-10-CM | POA: Diagnosis present

## 2018-02-11 DIAGNOSIS — Z813 Family history of other psychoactive substance abuse and dependence: Secondary | ICD-10-CM | POA: Diagnosis not present

## 2018-02-11 DIAGNOSIS — F39 Unspecified mood [affective] disorder: Principal | ICD-10-CM

## 2018-02-11 DIAGNOSIS — Z8249 Family history of ischemic heart disease and other diseases of the circulatory system: Secondary | ICD-10-CM | POA: Diagnosis not present

## 2018-02-11 DIAGNOSIS — F410 Panic disorder [episodic paroxysmal anxiety] without agoraphobia: Secondary | ICD-10-CM | POA: Diagnosis present

## 2018-02-11 DIAGNOSIS — F1020 Alcohol dependence, uncomplicated: Secondary | ICD-10-CM | POA: Diagnosis not present

## 2018-02-11 DIAGNOSIS — Y92009 Unspecified place in unspecified non-institutional (private) residence as the place of occurrence of the external cause: Secondary | ICD-10-CM | POA: Diagnosis not present

## 2018-02-11 DIAGNOSIS — F909 Attention-deficit hyperactivity disorder, unspecified type: Secondary | ICD-10-CM | POA: Diagnosis present

## 2018-02-11 DIAGNOSIS — F17210 Nicotine dependence, cigarettes, uncomplicated: Secondary | ICD-10-CM | POA: Diagnosis present

## 2018-02-11 LAB — LIPID PANEL
Cholesterol: 142 mg/dL (ref 0–200)
HDL: 52 mg/dL (ref >40)
LDL CALC: 70 mg/dL (ref 0–99)
TRIGLYCERIDES: 98 mg/dL (ref <150)
Total CHOL/HDL Ratio: 2.7 RATIO
VLDL: 20 mg/dL (ref 0–40)

## 2018-02-11 LAB — HEMOGLOBIN A1C
Hgb A1c MFr Bld: 4.5 % — ABNORMAL LOW (ref 4.8–5.6)
Mean Plasma Glucose: 82.45 mg/dL

## 2018-02-11 LAB — TSH: TSH: 1.244 u[IU]/mL (ref 0.350–4.500)

## 2018-02-11 MED ORDER — NICOTINE 21 MG/24HR TD PT24
21.0000 mg | MEDICATED_PATCH | Freq: Every day | TRANSDERMAL | Status: DC
  Administered 2018-02-11 – 2018-02-15 (×5): 21 mg via TRANSDERMAL
  Filled 2018-02-11 (×5): qty 1

## 2018-02-11 MED ORDER — MAGNESIUM HYDROXIDE 400 MG/5ML PO SUSP: 30.0000 mL | Freq: Every day | ORAL | Status: DC | PRN

## 2018-02-11 MED ORDER — LORAZEPAM 1 MG PO TABS
1.0000 mg | ORAL_TABLET | Freq: Four times a day (QID) | ORAL | Status: DC | PRN
  Administered 2018-02-12 – 2018-02-13 (×2): 1 mg via ORAL
  Filled 2018-02-11 (×2): qty 1

## 2018-02-11 MED ORDER — ALUM & MAG HYDROXIDE-SIMETH 200-200-20 MG/5ML PO SUSP: 30.0000 mL | ORAL | Status: DC | PRN

## 2018-02-11 MED ORDER — TRAZODONE HCL 100 MG PO TABS: 100.0000 mg | ORAL_TABLET | Freq: Every day | ORAL | Status: DC

## 2018-02-11 MED ORDER — QUETIAPINE FUMARATE 100 MG PO TABS
100.0000 mg | ORAL_TABLET | Freq: Every day | ORAL | Status: DC
  Administered 2018-02-11 – 2018-02-14 (×4): 100 mg via ORAL
  Filled 2018-02-11 (×4): qty 1

## 2018-02-11 MED ORDER — CITALOPRAM HYDROBROMIDE 20 MG PO TABS
10.0000 mg | ORAL_TABLET | Freq: Every day | ORAL | Status: DC
  Administered 2018-02-11 – 2018-02-13 (×3): 10 mg via ORAL
  Filled 2018-02-11 (×3): qty 1

## 2018-02-11 MED ORDER — HYDROXYZINE HCL 50 MG PO TABS
50.0000 mg | ORAL_TABLET | Freq: Three times a day (TID) | ORAL | Status: DC | PRN
  Administered 2018-02-11 – 2018-02-15 (×10): 50 mg via ORAL
  Filled 2018-02-11 (×12): qty 1

## 2018-02-11 MED ORDER — ACETAMINOPHEN 325 MG PO TABS
650.0000 mg | ORAL_TABLET | Freq: Four times a day (QID) | ORAL | Status: DC | PRN
  Administered 2018-02-15: 650 mg via ORAL
  Filled 2018-02-11: qty 2

## 2018-02-11 MED ORDER — VITAMIN B-1 100 MG PO TABS
100.0000 mg | ORAL_TABLET | Freq: Every day | ORAL | Status: DC
  Administered 2018-02-11 – 2018-02-15 (×5): 100 mg via ORAL
  Filled 2018-02-11 (×5): qty 1

## 2018-02-11 NOTE — Progress Notes (Signed)
D- Patient alert and oriented. Patient presents in a pleasant mood on assessment stating that she slept "alright" last night and had no major complaints or concerns to voice to this Clinical research associatewriter. Patient could not rate her depression/anxiety on a scale stating "I don't know how to rate that, it's something I always deal with, normal amounts". Patient denies SI, HI, AVH, and pain at this time. Patient's goal for today is "I gotta get out of here".  A- Scheduled medications administered to patient, per MD orders. Support and encouragement provided.  Routine safety checks conducted every 15 minutes.  Patient informed to notify staff with problems or concerns.  R- No adverse drug reactions noted. Patient contracts for safety at this time. Patient compliant with medications and treatment plan. Patient receptive, calm, and cooperative. Patient interacts well with others on the unit.  Patient remains safe at this time.
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
## 2018-02-11 NOTE — Tx Team (Signed)
Initial Treatment Plan 02/11/2018 4:50 AM [name] ZOX:[id number]    PATIENT STRESSORS: Medication change or noncompliance Substance abuse   PATIENT STRENGTHS: Ability for insight Capable of independent living Motivation for treatment/growth   PATIENT IDENTIFIED PROBLEMS: SUICIDE IDEATION   BIPOLAR                   DISCHARGE CRITERIA:  Improved stabilization in mood, thinking, and/or behavior Motivation to continue treatment in a less acute level of care  PRELIMINARY DISCHARGE PLAN: Outpatient therapy Participate in family therapy  PATIENT/FAMILY INVOLVEMENT: This treatment plan has been presented to and reviewed with the patient, Marie MunsonDanielle Renee Mckenzie, The patient and family have been given the opportunity to ask questions and make suggestions.  Trula Orelubukola Abisola Kathrina Crosley, RN 02/11/2018, 4:50 AM
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
## 2018-02-11 NOTE — Plan of Care (Signed)
Pt is A & O x4. Pt was met in her room laying quietly on the bed with her eyes closed. Pt stated that she was resting and would come out later for snacks and her meds. Pt denies SI/HI/AVH/ Pt stated she was a bit anxious and rated her anxiety a 3/10. Antianxiety meds were administered and med effectiveness noted as pt went to sleep. Pt ate her pm snacks and took her hs med. Thought process is logical and coherent. Affect is pleasant and mood is appropriate to situation. Q15 min safety checks maintained.  Problem: Education: Goal: Ability to make informed decisions regarding treatment will improve Outcome: Progressing   Problem: Coping: Goal: Coping ability will improve Outcome: Progressing   Problem: Health Behavior/Discharge Planning: Goal: Identification of resources available to assist in meeting health care needs will improve Outcome: Progressing   Problem: Medication: Goal: Compliance with prescribed medication regimen will improve Outcome: Progressing   Problem: Self-Concept: Goal: Ability to disclose and discuss suicidal ideas will improve Outcome: Progressing Goal: Will verbalize positive feelings about self Outcome: Progressing   Problem: Education: Goal: Ability to make informed decisions regarding treatment will improve Outcome: Progressing   Problem: Coping: Goal: Coping ability will improve Outcome: Progressing   Problem: Health Behavior/Discharge Planning: Goal: Identification of resources available to assist in meeting health care needs will improve Outcome: Progressing   Problem: Medication: Goal: Compliance with prescribed medication regimen will improve Outcome: Progressing   Problem: Self-Concept: Goal: Ability to disclose and discuss suicidal ideas will improve Outcome: Progressing Goal: Will verbalize positive feelings about self Outcome: Progressing

## 2018-02-11 NOTE — Tx Team (Addendum)
Interdisciplinary Treatment and Diagnostic Plan Update  02/11/2018 Time of Session: 9am Marie MunsonDanielle Renee Mckenzie MRN: [id number]  Principal Diagnosis: <principal problem not specified>  Secondary Diagnoses: Active Problems:   Bipolar 1 disorder, manic, moderate (HCC)   Current Medications:  Current Facility-Administered Medications  Medication Dose Route Frequency Provider Last Rate Last Dose  . acetaminophen (TYLENOL) tablet 650 mg  650 mg Oral Q6H PRN Clapacs, John T, MD      . alum & mag hydroxide-simeth (MAALOX/MYLANTA) 200-200-20 MG/5ML suspension 30 mL  30 mL Oral Q4H PRN Clapacs, John T, MD      . citalopram (CELEXA) tablet 10 mg  10 mg Oral Daily Clapacs, John T, MD   10 mg at 02/11/18 0825  . hydrOXYzine (ATARAX/VISTARIL) tablet 50 mg  50 mg Oral TID PRN Clapacs, Jackquline DenmarkJohn T, MD   50 mg at 02/11/18 0825  . magnesium hydroxide (MILK OF MAGNESIA) suspension 30 mL  30 mL Oral Daily PRN Clapacs, John T, MD      . nicotine (NICODERM CQ - dosed in mg/24 hours) patch 21 mg  21 mg Transdermal Daily Hessie Knows'Neal, Sarita, MD   21 mg at 02/11/18 1203  . QUEtiapine (SEROQUEL) tablet 100 mg  100 mg Oral QHS Clapacs, John T, MD      . thiamine (VITAMIN B-1) tablet 100 mg  100 mg Oral Daily Clapacs, John T, MD   100 mg at 02/11/18 0825   PTA Medications: Medications Prior to Admission  Medication Sig Dispense Refill Last Dose  . amphetamine-dextroamphetamine (ADDERALL) 20 MG tablet Take 1 tablet (20 mg total) by mouth daily. 30 tablet 0   . citalopram (CELEXA) 10 MG tablet Take 1 tablet (10 mg total) by mouth daily. 30 tablet 0   . folic acid (FOLVITE) 1 MG tablet Take 1 tablet (1 mg total) by mouth daily. (Patient not taking: Reported on 12/04/2017) 30 tablet 3 Not Taking at Unknown time  . hydrOXYzine (ATARAX/VISTARIL) 50 MG tablet Take 1 tablet (50 mg total) by mouth 3 (three) times daily as needed for anxiety. 60 tablet 0   . QUEtiapine (SEROQUEL) 100 MG tablet Take 1 tablet (100 mg total) by mouth at  bedtime. 30 tablet 0   . thiamine 100 MG tablet Take 1 tablet (100 mg total) by mouth daily.     . traZODone (DESYREL) 100 MG tablet Take 1 tablet (100 mg total) by mouth at bedtime as needed for sleep. 30 tablet 0     Patient Stressors: Medication change or noncompliance Substance abuse  Patient Strengths: Ability for insight Capable of independent living Motivation for treatment/growth  Treatment Modalities: Medication Management, Group therapy, Case management,  1 to 1 session with clinician, Psychoeducation, Recreational therapy.   Physician Treatment Plan for Primary Diagnosis: <principal problem not specified> Long Term Goal(s):     Short Term Goals:    Medication Management: Evaluate patient's response, side effects, and tolerance of medication regimen.  Therapeutic Interventions: 1 to 1 sessions, Unit Group sessions and Medication administration.  Evaluation of Outcomes: Progressing  Physician Treatment Plan for Secondary Diagnosis: Active Problems:   Bipolar 1 disorder, manic, moderate (HCC)  Long Term Goal(s):     Short Term Goals:       Medication Management: Evaluate patient's response, side effects, and tolerance of medication regimen.  Therapeutic Interventions: 1 to 1 sessions, Unit Group sessions and Medication administration.  Evaluation of Outcomes: Progressing   RN Treatment Plan for Primary Diagnosis: <principal problem not specified> Long Term Goal(s):  Knowledge of disease and therapeutic regimen to maintain health will improve  Short Term Goals: Ability to participate in decision making will improve, Ability to verbalize feelings will improve, Ability to disclose and discuss suicidal ideas and Compliance with prescribed medications will improve  Medication Management: RN will administer medications as ordered by provider, will assess and evaluate patient's response and provide education to patient for prescribed medication. RN will report any  adverse and/or side effects to prescribing provider.  Therapeutic Interventions: 1 on 1 counseling sessions, Psychoeducation, Medication administration, Evaluate responses to treatment, Monitor vital signs and CBGs as ordered, Perform/monitor CIWA, COWS, AIMS and Fall Risk screenings as ordered, Perform wound care treatments as ordered.  Evaluation of Outcomes: Progressing   LCSW Treatment Plan for Primary Diagnosis: <principal problem not specified> Long Term Goal(s): Safe transition to appropriate next level of care at discharge, Engage patient in therapeutic group addressing interpersonal concerns.  Short Term Goals: Engage patient in aftercare planning with referrals and resources  Therapeutic Interventions: Assess for all discharge needs, 1 to 1 time with Social worker, Explore available resources and support systems, Assess for adequacy in community support network, Educate family and significant other(s) on suicide prevention, Complete Psychosocial Assessment, Interpersonal group therapy.  Evaluation of Outcomes: Progressing   Progress in Treatment: Attending groups: Yes. Participating in groups: Yes. Taking medication as prescribed: Yes. Toleration medication: Yes. Family/Significant other contact made: No, will contact:  CSW will contact when pt gives consent Patient understands diagnosis: Yes. Discussing patient identified problems/goals with staff: Yes. Medical problems stabilized or resolved: Yes. Denies suicidal/homicidal ideation: Yes. Issues/concerns per patient self-inventory: No. Other: NA  New problem(s) identified: No, Describe:  None reported  New Short Term/Long Term Goal(s):"To get out of here"  Patient Goals:  "To get out of here"  Discharge Plan or Barriers: Pt will return home and follow up with outpatient treatment  Reason for Continuation of Hospitalization: Medication stabilization  Estimated Length of Stay: 3-5 days Recreational Therapy: Patient  Stressors: Family Patient Goal: Patient will engage in groups without prompting or encouragement from LRT x3 group sessions within 5 recreation therapy group sessions  Attendees: Patient:Marie Mckenzie 02/11/2018 1:53 PM  Physician: Ardeen Fillers, MD 02/11/2018 1:53 PM  Nursing: Primus Bravo 02/11/2018 1:53 PM  RN Care Manager: 02/11/2018 1:53 PM  Social Worker: Lowella Dandy, LCSW 02/11/2018 1:53 PM  Recreational Therapist:  02/11/2018 1:53 PM  Other:  02/11/2018 1:53 PM  Other:  02/11/2018 1:53 PM  Other: 02/11/2018 1:53 PM    Scribe for Treatment Team: Suzan Slick, LCSW 02/11/2018 1:53 PM

## 2018-02-11 NOTE — [facility] Group Notes (Signed)
[facility] Group Notes:  (Nursing/MHT/Case Management/Adjunct)  Date:  02/11/2018  Time:  10:28 PM  Type of Therapy:  Group Therapy  Participation Level:  Active  Participation Quality:  Appropriate  Affect:  Appropriate  Cognitive:  Alert  Insight:  Good  Engagement in Group:  Engaged  Modes of Intervention:  Activity  Summary of Progress/Problems:  Marie NeerJackie L Maxden Naji 02/11/2018, 10:28 PM
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
## 2018-02-11 NOTE — ED Notes (Signed)
Hourly rounding reveals patient sleeping in room. No complaints, stable, in no acute distress. Q15 minute rounds and monitoring via Security Cameras to continue. 
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
## 2018-02-12 NOTE — Progress Notes (Signed)
[facility] MD Progress Note  02/12/2018 4:56 PM Marie Mckenzie  MRN:  [id number] Subjective:  Pt is pushing for discharge, but I let her know that there is concern about her recent behaviors.  She has had multiple hospital visits in the last year regarding her alcohol use disorder.  Pt has been making poor decisions.  Quite frankly her story upon this admission is quite bizarre.  He outpt psychiatrist and I are concerned for the pt.  Pt will not consent for staff to speak with her mother, but even without speaking to the mother, I notified the the pt that I advocate for continued monitoring to ensure she is not at imminent danger to self or others.    Pt plans to call the police after discharge to ask for an escort to her mother's home to get her belongings.    Principal Problem: Unspecified mood (affective) disorder (HCC) Diagnosis: Principal Problem:   Unspecified mood (affective) disorder (HCC)  Total Time spent with patient: 20 minutes  Past Psychiatric History: see H&P  Past Medical History:  Past Medical History:  Diagnosis Date  . ADHD (attention deficit hyperactivity disorder)   . Anxiety   . Hypertension   . Major depressive disorder     Past Surgical History:  Procedure Laterality Date  . TONSILLECTOMY    . WISDOM TOOTH EXTRACTION     Family History:  Family History  Problem Relation Age of Onset  . Hypertension Father    Family Psychiatric  History: see H&P Social History:  Social History   Substance and Sexual Activity  Alcohol Use Yes  . Alcohol/week: 7.0 standard drinks  . Types: 7 Glasses of wine per week   Comment: Hx ETOH     Social History   Substance and Sexual Activity  Drug Use No    Social History   Socioeconomic History  . Marital status: Single    Spouse name: Not on file  . Number of children: Not on file  . Years of education: Not on file  . Highest education level: Not on file  Occupational History  . Not on file  Social Needs  .  Financial resource strain: Not on file  . Food insecurity:    Worry: Not on file    Inability: Not on file  . Transportation needs:    Medical: Not on file    Non-medical: Not on file  Tobacco Use  . Smoking status: Current Every Day Smoker    Packs/day: 1.00    Types: Cigarettes  . Smokeless tobacco: Never Used  Substance and Sexual Activity  . Alcohol use: Yes    Alcohol/week: 7.0 standard drinks    Types: 7 Glasses of wine per week    Comment: Hx ETOH  . Drug use: No  . Sexual activity: Yes    Birth control/protection: Condom  Lifestyle  . Physical activity:    Days per week: Not on file    Minutes per session: Not on file  . Stress: Not on file  Relationships  . Social connections:    Talks on phone: Not on file    Gets together: Not on file    Attends religious service: Not on file    Active member of club or organization: Not on file    Attends meetings of clubs or organizations: Not on file    Relationship status: Not on file  Other Topics Concern  . Not on file  Social History Narrative  . Not  on file   Additional Social History: see H&P                        Sleep: Good  Appetite:  Fair  Current Medications: Current Facility-Administered Medications  Medication Dose Route Frequency Provider Last Rate Last Dose  . acetaminophen (TYLENOL) tablet 650 mg  650 mg Oral Q6H PRN Clapacs, John T, MD      . alum & mag hydroxide-simeth (MAALOX/MYLANTA) 200-200-20 MG/5ML suspension 30 mL  30 mL Oral Q4H PRN Clapacs, John T, MD      . citalopram (CELEXA) tablet 10 mg  10 mg Oral Daily Clapacs, Jackquline Denmark, MD   10 mg at 02/12/18 0801  . hydrOXYzine (ATARAX/VISTARIL) tablet 50 mg  50 mg Oral TID PRN Clapacs, Jackquline Denmark, MD   50 mg at 02/12/18 0801  . LORazepam (ATIVAN) tablet 1 mg  1 mg Oral Q6H PRN Hessie Knows, MD      . magnesium hydroxide (MILK OF MAGNESIA) suspension 30 mL  30 mL Oral Daily PRN Clapacs, John T, MD      . nicotine (NICODERM CQ - dosed in mg/24  hours) patch 21 mg  21 mg Transdermal Daily Hessie Knows, MD   21 mg at 02/12/18 0802  . QUEtiapine (SEROQUEL) tablet 100 mg  100 mg Oral QHS Clapacs, Jackquline Denmark, MD   100 mg at 02/11/18 2103  . thiamine (VITAMIN B-1) tablet 100 mg  100 mg Oral Daily Clapacs, Jackquline Denmark, MD   100 mg at 02/12/18 1610    Lab Results:  Results for orders placed or performed during the hospital encounter of 02/11/18 (from the past 48 hour(s))  Hemoglobin A1c     Status: Abnormal   Collection Time: 02/11/18  7:27 AM  Result Value Ref Range   Hgb A1c MFr Bld 4.5 (L) 4.8 - 5.6 %    Comment: (NOTE) Pre diabetes:          5.7%-6.4% Diabetes:              >6.4% Glycemic control for   <7.0% adults with diabetes    Mean Plasma Glucose 82.45 mg/dL    Comment: Performed at [facility], [address], [place], [place] [address]  Lipid panel     Status: None   Collection Time: 02/11/18  7:27 AM  Result Value Ref Range   Cholesterol 142 0 - 200 mg/dL   Triglycerides 98 <409 mg/dL   HDL 52 >81 mg/dL   Total CHOL/HDL Ratio 2.7 RATIO   VLDL 20 0 - 40 mg/dL   LDL Cholesterol 70 0 - 99 mg/dL    Comment:        Total Cholesterol/HDL:CHD Risk Coronary Heart Disease Risk Table                     Men   Women  1/2 Average Risk   3.4   3.3  Average Risk       5.0   4.4  2 X Average Risk   9.6   7.1  3 X Average Risk  23.4   11.0        Use the calculated Patient Ratio above and the CHD Risk Table to determine the patient's CHD Risk.        ATP III CLASSIFICATION (LDL):  <100     mg/dL   Optimal  191-478  mg/dL   Near or Above  Optimal  130-159  mg/dL   Borderline  161-096  mg/dL   High  >045     mg/dL   Very High Performed at [facility], [address] [place], [place] [address]   TSH     Status: None   Collection Time: 02/11/18  7:27 AM  Result Value Ref Range   TSH 1.244 0.350 - 4.500 uIU/mL    Comment: Performed by a 3rd Generation assay with a functional sensitivity of  <=0.01 uIU/mL. Performed at [facility], [address] [place], [place] [address]     Blood Alcohol level:  Lab Results  Component Value Date   ETH 131 (H) 02/10/2018   ETH 35 (H) 01/07/2018    Metabolic Disorder Labs: Lab Results  Component Value Date   HGBA1C 4.5 (L) 02/11/2018   MPG 82.45 02/11/2018   MPG 93.93 12/05/2017   No results found for: PROLACTIN Lab Results  Component Value Date   CHOL 142 02/11/2018   TRIG 98 02/11/2018   HDL 52 02/11/2018   CHOLHDL 2.7 02/11/2018   VLDL 20 02/11/2018   LDLCALC 70 02/11/2018   LDLCALC 69 12/05/2017    Physical Findings: AIMS: Facial and Oral Movements Muscles of Facial Expression: None, normal Lips and Perioral Area: None, normal Jaw: None, normal Tongue: None, normal,Extremity Movements Upper (arms, wrists, hands, fingers): None, normal Lower (legs, knees, ankles, toes): None, normal, Trunk Movements Neck, shoulders, hips: None, normal, Overall Severity Severity of abnormal movements (highest score from questions above): None, normal Incapacitation due to abnormal movements: None, normal Patient's awareness of abnormal movements (rate only patient's report): No Awareness, Dental Status Current problems with teeth and/or dentures?: No Does patient usually wear dentures?: No  CIWA:  CIWA-Ar Total: 3 COWS:  COWS Total Score: 0  Musculoskeletal: Strength & Muscle Tone: within normal limits Gait & Station: normal Patient leans: N/A  Psychiatric Specialty Exam: Physical Exam  Nursing note and vitals reviewed. Respiratory: Effort normal.    Review of Systems  Psychiatric/Behavioral: Negative for hallucinations and suicidal ideas.    Blood pressure 114/83, pulse 81, temperature 97.9 F (36.6 C), temperature source Oral, resp. rate 18, height 5\' 6"  (1.676 m), weight 83 kg, last menstrual period 02/07/2018, SpO2 100 %.Body mass index is 29.54 kg/m.  General Appearance: Fairly Groomed  Eye Contact:   Fair  Speech:  Clear and Coherent  Volume:  Normal  Mood:  Anxious  Affect:  congruent  Thought Process:  Goal Directed  Orientation:  Full (Time, Place, and Person)  Thought Content:  focused on discharge  Suicidal Thoughts:  pt denies  Homicidal Thoughts:  pt denies  Memory:  grossly intact  Judgement:  Poor  Insight:  Lacking  Psychomotor Activity:  Normal  Concentration:  Concentration: Fair and Attention Span: Fair  Recall:  grossly intact  Fund of Knowledge:  Fair  Language:  Fair  Akathisia:  No  AIMS (if indicated):   0  Assets:  Manufacturing systems engineer Resilience  ADL's:  Intact  Cognition:  WNL  Sleep:  Number of Hours: 8.15     Treatment Plan Summary: Daily contact with patient to assess and evaluate symptoms and progress in treatment, Medication management and Plan see below  27 yo female with history of depression and ADHD admitted after being petitioned for IVC by her mother for erratic behaviors and concern for SI with plan to OD.  Pt denies being suicidal.  She states this is her mother's way of punishing her  for not giving in to her mother's demands for samples of pt's adderall.  Pt denies SI, HI, AH, VH and substance abuse issues.  Although, after a chart review, I can see that pt has been seen in the Emergency department multiple times for alcohol intoxication and even in [facility] ED in May 2019 for a presumed amphetamine overdose and alcohol intoxication; again in May 2019 for problems related to ecstasy and substance abuse; In May 2018, [facility] for alcohol withdrawal delirium, Jan 2019 [facility] withdrawal delirium; June 2019 for alcohol withdrawal, and Sept 2019 at [facility] for self-inflicted cut.  Pt doesn't appear to be doing well.  She is homeless and unemployed.  She appears to be struggling to stay abstinent from alcohol and drugs.  She is in need of further evaluation and treatment.  Although pt denies  excessive alcohol use, I will put her on CIWA due to her history of alcohol abuse and complicated withdrawal symptoms as indicated by previous ED visits.    Mood disorder, unspecified (rule out bipolar disorder; substance induced mood disorder; vs Major depressive disorder) -continue seroquel 100 mg nightly -continue celexa 10 mg daily. Pt states her outpt psychiatrist increased the celexa to 20 mg daily, but will hold at 10 mg daily until her repeat EKG is done to monitor her QTc.  Her previous QTc was prolonged at 534.  Pt voices understanding of the risks of prolonged QTc (including sudden cardiac death) and consents to continue her medications at the current doses  Prolonged QTc (534) -get repeat EKG--of note repeat EKG showed a reduction in QTc to 457  ADHD -hold adderall as this could be inducing irrational and erratic behaviors, mimicking mania or psychosis  Alcohol use disorder -CIWA; monitor for alcohol withdrawal symptoms.  -vitamin B1 daily  Tobacco use disorder -nicotine patch -smoking cessation encouraged  Observation Level/Precautions:  15 minute checks  Laboratory:  see orders  Psychotherapy:  group  Medications:  See MAR  Consultations:  N/A  Estimated LOS: 3-5 days; pt will return to her friend, Neva SeatDonald Lloyd's, house after discharge.   Margarette CanadaDonald Lloyd [phone]-signed consent to speak to Mr. Sharon SellerLloyd is on the chart.  Signed consent for [facility] is also on the chart.        Hessie KnowsSarita O'Neal, MD 02/12/2018, 4:56 PM

## 2018-02-12 NOTE — Plan of Care (Signed)
Spock with patient few minutes ago when she expressed feeling cold inside and anxious and also observed patient shaking and annoyed while speaking, patient verbally contracts for safety, patient is given ativan 1 mg p,o for her anxiety, no episodes of agitation or violent behaviors  patient have  disorganized thoughts process, have vague insights of her medical conditions , compliant with her medications and maintaining safety in the unit, patient denies any SI/HI/AVH support and education is provided to patient , patient said appetite is good, 15 minute safety checks is in progress. No distress.   Problem: Education: Goal: Ability to make informed decisions regarding treatment will improve Outcome: Progressing   Problem: Coping: Goal: Coping ability will improve Outcome: Progressing   Problem: Health Behavior/Discharge Planning: Goal: Identification of resources available to assist in meeting health care needs will improve Outcome: Progressing   Problem: Medication: Goal: Compliance with prescribed medication regimen will improve Outcome: Progressing   Problem: Self-Concept: Goal: Ability to disclose and discuss suicidal ideas will improve Outcome: Progressing Goal: Will verbalize positive feelings about self Outcome: Progressing   Problem: Education: Goal: Ability to make informed decisions regarding treatment will improve Outcome: Progressing   Problem: Coping: Goal: Coping ability will improve Outcome: Progressing   Problem: Health Behavior/Discharge Planning: Goal: Identification of resources available to assist in meeting health care needs will improve Outcome: Progressing   Problem: Medication: Goal: Compliance with prescribed medication regimen will improve Outcome: Progressing   Problem: Self-Concept: Goal: Ability to disclose and discuss suicidal ideas will improve Outcome: Progressing Goal: Will verbalize positive feelings about self Outcome: Progressing
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
## 2018-02-12 NOTE — Plan of Care (Signed)
Patient is appropriate in the unit.Patient was insisting for discharge stated that she need to take her things out of her mom's house.Patient got emotional when she was talking about her mother.Patient got relaxed after she verbalized her feelings.Compliant with medications.Appetite and energy level good.Denies SI,HI and AVH.Support and encouragement given.

## 2018-02-12 NOTE — BHH Group Notes (Signed)
  LCSW Group Therapy Note Dec 6th,2019 at 1pm  Type of Therapy and Topic:  Group Therapy:  Feelings around Relapse and Recovery  Participation Level:  Good participation  Description of Group:    Patients in this group will discuss emotions they experience before and after a relapse. They will process how experiencing these feelings, or avoidance of experiencing them, relates to having a relapse. Facilitator will guide patients to explore emotions they have related to recovery. Patients will be encouraged to process which emotions are more powerful. They will be guided to discuss the emotional reaction significant others in their lives may have to patients' relapse or recovery. Patients will be assisted in exploring ways to respond to the emotions of others without this contributing to a relapse.  Therapeutic Goals: 1. Patient will identify two or more emotions that lead to a relapse for them 2. Patient will identify two emotions that result when they relapse 3. Patient will identify two emotions related to recovery 4. Patient will demonstrate ability to communicate their needs through discussion and/or role plays   Summary of Patient Progress:  This patient was very supportive of her peers when they were talking. She was able to relate with group discussions and contributed well.    Therapeutic Modalities:   Cognitive Behavioral Therapy Solution-Focused Therapy Assertiveness Training Relapse Prevention Therapy   Marie NabClaudine Raphaella Larkin LCSW December 6th,2019 at 1pm

## 2018-02-12 NOTE — Progress Notes (Signed)
Recreation Therapy Notes   Date: 02/12/2018  Time: 9:30 am  Location: Craft Room  Behavioral response: Appropriate    Intervention Topic: Teamwork  Discussion/Intervention:  Group content on today was focused on teamwork. The group identified what teamwork is. Individuals described who is a part of their team. Patients expressed why they thought teamwork is important. The group stated reasons why they thought it was easier to work with a Comptrollersmaller/larger team. Individuals discussed some positives and negatives of working with a team. Patients gave examples of past experiences they had while working with a team. The group participated in the intervention "Team Feud", patients were in groups and had to keep the balls on the surface given.  Clinical Observations/Feedback:  Patient came to group and team work as goal oriented. She identified listening, creating a plan and time management as important things that need to happen while working with a team. Individual participated in group and was social with peers and staff while participating in the intervention.  Maily Debarge LRT/CTRS         [name] 02/12/2018 11:49 AM

## 2018-02-13 MED ORDER — CITALOPRAM HYDROBROMIDE 20 MG PO TABS
20.0000 mg | ORAL_TABLET | Freq: Every day | ORAL | Status: DC
  Administered 2018-02-14 – 2018-02-15 (×2): 20 mg via ORAL
  Filled 2018-02-13 (×2): qty 1

## 2018-02-13 MED ORDER — LORAZEPAM 1 MG PO TABS: 1.0000 mg | ORAL_TABLET | Freq: Four times a day (QID) | ORAL | Status: AC | PRN

## 2018-02-13 NOTE — Progress Notes (Signed)
[facility] MD Progress Note  02/13/2018 12:50 PM Marie Mckenzie  MRN:  [id number] Subjective:   Pt seen and chart reviewed. The very first thing she asked was "they did not give me my adderall, can you fix that?"  The writer firmly declined and explained to her that there is no indication for stimulant and that the combination of adderall with alcohol is counterproductive.  Pt denied ever taking more adderall than prescribed anda accused her mom for stealing her Adderall and "kicking me out (of her house)!"  She minimizes any wrong doing from her part, and believes that her current admission is all because her mom wants to get her out of the house, so that her mom can take all her Adderall herself (mom).   Per [place] [facility], pt has been prescribed Adderall 20mg  TID in a LONG-term by Dr. Lynett Fish, last filled #90 on 02/08/2018.  I sincerely suspect abusing of his meds.   The primary attending Dr. Flora Lipps has spoken with Dr. Lynett Fish regarding pt's psych disorders.    Principal Problem: Unspecified mood (affective) disorder (HCC) Diagnosis: Principal Problem:   Unspecified mood (affective) disorder (HCC)  Total Time spent with patient: 20 minutes  Past Psychiatric History: see H&P  Past Medical History:  Past Medical History:  Diagnosis Date  . ADHD (attention deficit hyperactivity disorder)   . Anxiety   . Hypertension   . Major depressive disorder     Past Surgical History:  Procedure Laterality Date  . TONSILLECTOMY    . WISDOM TOOTH EXTRACTION     Family History:  Family History  Problem Relation Age of Onset  . Hypertension Father    Family Psychiatric  History: see H&P Social History:  Social History   Substance and Sexual Activity  Alcohol Use Yes  . Alcohol/week: 7.0 standard drinks  . Types: 7 Glasses of wine per week   Comment: Hx ETOH     Social History   Substance and Sexual Activity  Drug Use No    Social History   Socioeconomic History  . Marital status:  Single    Spouse name: Not on file  . Number of children: Not on file  . Years of education: Not on file  . Highest education level: Not on file  Occupational History  . Not on file  Social Needs  . Financial resource strain: Not on file  . Food insecurity:    Worry: Not on file    Inability: Not on file  . Transportation needs:    Medical: Not on file    Non-medical: Not on file  Tobacco Use  . Smoking status: Current Every Day Smoker    Packs/day: 1.00    Types: Cigarettes  . Smokeless tobacco: Never Used  Substance and Sexual Activity  . Alcohol use: Yes    Alcohol/week: 7.0 standard drinks    Types: 7 Glasses of wine per week    Comment: Hx ETOH  . Drug use: No  . Sexual activity: Yes    Birth control/protection: Condom  Lifestyle  . Physical activity:    Days per week: Not on file    Minutes per session: Not on file  . Stress: Not on file  Relationships  . Social connections:    Talks on phone: Not on file    Gets together: Not on file    Attends religious service: Not on file    Active member of club or organization: Not on file  Attends meetings of clubs or organizations: Not on file    Relationship status: Not on file  Other Topics Concern  . Not on file  Social History Narrative  . Not on file   Additional Social History: see H&P  Sleep: Good  Appetite:  Fair  Current Medications: Current Facility-Administered Medications  Medication Dose Route Frequency Provider Last Rate Last Dose  . acetaminophen (TYLENOL) tablet 650 mg  650 mg Oral Q6H PRN Clapacs, John T, MD      . alum & mag hydroxide-simeth (MAALOX/MYLANTA) 200-200-20 MG/5ML suspension 30 mL  30 mL Oral Q4H PRN Clapacs, Jackquline Denmark, MD      . Melene Muller ON 02/14/2018] citalopram (CELEXA) tablet 20 mg  20 mg Oral Daily Cheral Cappucci, MD      . hydrOXYzine (ATARAX/VISTARIL) tablet 50 mg  50 mg Oral TID PRN Clapacs, Jackquline Denmark, MD   50 mg at 02/13/18 0803  . LORazepam (ATIVAN) tablet 1 mg  1 mg Oral Q6H PRN Tiffini Blacksher,  Jaquay Posthumus, MD      . magnesium hydroxide (MILK OF MAGNESIA) suspension 30 mL  30 mL Oral Daily PRN Clapacs, John T, MD      . nicotine (NICODERM CQ - dosed in mg/24 hours) patch 21 mg  21 mg Transdermal Daily Hessie Knows, MD   21 mg at 02/13/18 0801  . QUEtiapine (SEROQUEL) tablet 100 mg  100 mg Oral QHS Clapacs, John T, MD   100 mg at 02/12/18 2140  . thiamine (VITAMIN B-1) tablet 100 mg  100 mg Oral Daily Clapacs, John T, MD   100 mg at 02/13/18 1610    Lab Results:  No results found for this or any previous visit (from the past 48 hour(s)).  Blood Alcohol level:  Lab Results  Component Value Date   ETH 131 (H) 02/10/2018   ETH 35 (H) 01/07/2018    Metabolic Disorder Labs: Lab Results  Component Value Date   HGBA1C 4.5 (L) 02/11/2018   MPG 82.45 02/11/2018   MPG 93.93 12/05/2017   No results found for: PROLACTIN Lab Results  Component Value Date   CHOL 142 02/11/2018   TRIG 98 02/11/2018   HDL 52 02/11/2018   CHOLHDL 2.7 02/11/2018   VLDL 20 02/11/2018   LDLCALC 70 02/11/2018   LDLCALC 69 12/05/2017    Physical Findings: AIMS: Facial and Oral Movements Muscles of Facial Expression: None, normal Lips and Perioral Area: None, normal Jaw: None, normal Tongue: None, normal,Extremity Movements Upper (arms, wrists, hands, fingers): None, normal Lower (legs, knees, ankles, toes): None, normal, Trunk Movements Neck, shoulders, hips: None, normal, Overall Severity Severity of abnormal movements (highest score from questions above): None, normal Incapacitation due to abnormal movements: None, normal Patient's awareness of abnormal movements (rate only patient's report): No Awareness, Dental Status Current problems with teeth and/or dentures?: No Does patient usually wear dentures?: No  CIWA:  CIWA-Ar Total: 15 COWS:  COWS Total Score: 0  Musculoskeletal: Strength & Muscle Tone: within normal limits Gait & Station: normal Patient leans: N/A  Psychiatric Specialty  Exam: Physical Exam  Nursing note and vitals reviewed. Respiratory: Effort normal.    Review of Systems  Psychiatric/Behavioral: Negative for hallucinations and suicidal ideas.    Blood pressure 117/82, pulse 91, temperature 98 F (36.7 C), temperature source Oral, resp. rate 18, height 5\' 6"  (1.676 m), weight 83 kg, last menstrual period 02/07/2018, SpO2 98 %.Body mass index is 29.54 kg/m.  General Appearance: Fairly Groomed  Eye Contact:  Fair  Speech:  Clear and Coherent  Volume:  Normal  Mood:  Anxious  Affect:  Constricted, Depressed and congruent  Thought Process:  Goal Directed  Orientation:  Full (Time, Place, and Person)  Thought Content:  focused on discharge  Suicidal Thoughts:  pt denies  Homicidal Thoughts:  pt denies  Memory:  grossly intact  Judgement:  Fair  Insight:  Lacking  Psychomotor Activity:  Normal  Concentration:  Concentration: Fair and Attention Span: Fair  Recall:  grossly intact  Fund of Knowledge:  Fair  Language:  Fair  Akathisia:  No  AIMS (if indicated):   0  Assets:  Manufacturing systems engineerCommunication Skills Resilience  ADL's:  Intact  Cognition:  WNL  Sleep:  Number of Hours: 6     Treatment Plan Summary: Daily contact with patient to assess and evaluate symptoms and progress in treatment, Medication management and Plan see below  27 yo female with history of depression and ADHD admitted after being petitioned for IVC by her mother for erratic behaviors and concern for SI with plan to OD.  Pt denies being suicidal.  She states this is her mother's way of punishing her for not giving in to her mother's demands for samples of pt's adderall.  Pt denies SI, HI, AH, VH and substance abuse issues.  Although, after a chart review, I can see that pt has been seen in the Emergency department multiple times for alcohol intoxication and even [facility] ED in May 2019 for a presumed amphetamine overdose and alcohol intoxication; again in May 2019 for problems  related to ecstasy and substance abuse; In May 2018, [facility] for alcohol withdrawal delirium, [facility]; June 2019 for alcohol withdrawal, and [facility] for self-inflicted cut.  Pt doesn't appear to be doing well.  She is homeless and unemployed.  She appears to be struggling to stay abstinent from alcohol and drugs.  She is in need of further evaluation and treatment.  Although pt denies excessive alcohol use, I will put her on CIWA due to her history of alcohol abuse and complicated withdrawal symptoms as indicated by previous ED visits.    Mood disorder, unspecified (rule out bipolar disorder; substance induced mood disorder; vs Major depressive disorder) -continue seroquel 100 mg nightly -increase celexa 20 mg daily after her QTc back to 457 on 12/5.    Prolonged QTc (534) -get repeat EKG--of note repeat EKG showed a reduction in QTc to 457 on 02/11/18  ADHD -hold adderall as this could be inducing irrational and erratic behaviors, mimicking mania or psychosis -Sincerely recommend to reassess pt's need for Stimulants after she has sustained sobriety from alcohol. Also, recommend NOT to exceed the FDA approved maximal dose of adderall 40mg  daily, if pt truly has ADD.   Alcohol use disorder -CIWA; monitor for alcohol withdrawal symptoms.  No active Sx, but pt tends to ask for ativan.  -vitamin B1 daily  Tobacco use disorder -nicotine patch -smoking cessation encouraged  Observation Level/Precautions:  15 minute checks  Laboratory:  see orders  Psychotherapy:  group  Medications:  See MAR  Consultations:  N/A  Estimated LOS: 3-5 days; pt will return to her friend, Neva SeatDonald Lloyd's, house after discharge.   Margarette CanadaDonald Lloyd [phone]-signed consent to speak to Mr. Sharon SellerLloyd is on the chart.  Signed consent [facility] is also on the chart.        Loeta Herst  Khadir Roam, MD 02/13/2018, 12:50 PM

## 2018-02-13 NOTE — [facility] Group Notes (Signed)
LCSW Group Therapy Note   02/13/2018 1:15pm   Type of Therapy and Topic:  Group Therapy:  Trust and Honesty  Participation Level:  Active  Description of Group:    In this group patients will be asked to explore the value of being honest.  Patients will be guided to discuss their thoughts, feelings, and behaviors related to honesty and trusting in others. Patients will process together how trust and honesty relate to forming relationships with peers, family members, and self. Each patient will be challenged to identify and express feelings of being vulnerable. Patients will discuss reasons why people are dishonest and identify alternative outcomes if one was truthful (to self or others). This group will be process-oriented, with patients participating in exploration of their own experiences, giving and receiving support, and processing challenge from other group members.   Therapeutic Goals: 1. Patient will identify why honesty is important to relationships and how honesty overall affects relationships.  2. Patient will identify a situation where they lied or were lied too and the  feelings, thought process, and behaviors surrounding the situation 3. Patient will identify the meaning of being vulnerable, how that feels, and how that correlates to being honest with self and others. 4. Patient will identify situations where they could have told the truth, but instead lied and explain reasons of dishonesty.   Summary of Patient Progress:The patient reported that she feels "depressed." The patient was able to explore the value of being honest.  Patient discussed thoughts, feelings, and behaviors related to honesty and trusting in others. The patient processed together with other group members how trust and honesty relate to forming relationships with peers, family members, and self. Pt actively and appropriately engaged in the group. Patient was able to provide support and validation to other group  members. Patient practiced active listening when interacting with the facilitator and other group members.     Therapeutic Modalities:   Cognitive Behavioral Therapy Solution Focused Therapy Motivational Interviewing Brief Therapy  Marie Mckenzie  CUEBAS-COLON, LCSW 02/13/2018 12:54 PM

## 2018-02-13 NOTE — Plan of Care (Signed)
Patient stated that her anxiety is "high" and she is having withdrawals like "hot/ cold sweats".Rated her depression 5/10.Denies SI,HI and AVH.Attended groups.Compliant with medications.Appetite good.Energy level fair.Support and energy level good.

## 2018-02-14 MED ORDER — IBUPROFEN 600 MG PO TABS
600.0000 mg | ORAL_TABLET | Freq: Four times a day (QID) | ORAL | Status: DC | PRN
  Administered 2018-02-14 (×2): 600 mg via ORAL
  Filled 2018-02-14 (×2): qty 1

## 2018-02-14 MED ORDER — QUETIAPINE FUMARATE 25 MG PO TABS
50.0000 mg | ORAL_TABLET | Freq: Two times a day (BID) | ORAL | Status: DC | PRN
  Administered 2018-02-14 (×2): 50 mg via ORAL
  Filled 2018-02-14 (×2): qty 2

## 2018-02-14 NOTE — Plan of Care (Signed)
Patient has the ability to make informed decisions regarding her health-care as well as the ability to cope and has attended and participated in social work group without any issues at this time.  Patient has verbalized understanding of and has been in compliance with her prescribed therapeutic regimen this far. Patient has the ability to identify the available resources to assist her in meeting her health-care needs, but has not voiced anything to this Clinical research associatewriter as of yet. Patient denies SI/HI/AVH and rates her anxiety a "10/10", but does not know why she's anxious. Patient reports  that her depression is "up there" because of her tooth. Patient has been free from injury and remains safe on the unit at this time.  Problem: Education: Goal: Ability to make informed decisions regarding treatment will improve Outcome: Progressing   Problem: Coping: Goal: Coping ability will improve Outcome: Progressing   Problem: Health Behavior/Discharge Planning: Goal: Identification of resources available to assist in meeting health care needs will improve Outcome: Progressing   Problem: Medication: Goal: Compliance with prescribed medication regimen will improve Outcome: Progressing   Problem: Self-Concept: Goal: Ability to disclose and discuss suicidal ideas will improve Outcome: Progressing Goal: Will verbalize positive feelings about self Outcome: Progressing   Problem: Education: Goal: Ability to make informed decisions regarding treatment will improve Outcome: Progressing   Problem: Coping: Goal: Coping ability will improve Outcome: Progressing   Problem: Health Behavior/Discharge Planning: Goal: Identification of resources available to assist in meeting health care needs will improve Outcome: Progressing   Problem: Medication: Goal: Compliance with prescribed medication regimen will improve Outcome: Progressing   Problem: Self-Concept: Goal: Ability to disclose and discuss suicidal ideas  will improve Outcome: Progressing Goal: Will verbalize positive feelings about self Outcome: Progressing   Problem: Safety: Goal: Ability to remain free from injury will improve Outcome: Progressing

## 2018-02-14 NOTE — Progress Notes (Addendum)
[facility] MD Progress Note  02/14/2018 1:18 PM Marie Mckenzie  MRN:  [id number] Subjective:   Pt seen and chart reviewed. Again, she asked for her Adderall, said that she is in withdrawal from it, which made her irritable, and sweaty. She said that she has dreams about it.   She was explained again that she has all signs and Sx of adderall addiction, and that she needs to get off it.  She said "that is between me and my Doc (referring to her Adderall prescriber Dr. Lynett Fish).   She is firmly informed that the writer is not going to give any Adderall.  She then asked for Klonopin, which I also declined.   Then she said "you got to give me something! I am irritable!!"   We agreed on adding some PRN seroquel.   Per [place] CSRS, pt has been prescribed Adderall 20mg  TID in a LONG-term by Dr. Lynett Fish, last filled #90 on 02/08/2018.  I sincerely suspect abusing of his meds.   The primary attending Dr. Flora Lipps has spoken with Dr. Lynett Fish regarding pt's psych disorders.    Principal Problem: Unspecified mood (affective) disorder (HCC) Diagnosis: Principal Problem:   Unspecified mood (affective) disorder (HCC)  Total Time spent with patient: 20 minutes  Past Psychiatric History: see H&P  Past Medical History:  Past Medical History:  Diagnosis Date  . ADHD (attention deficit hyperactivity disorder)   . Anxiety   . Hypertension   . Major depressive disorder     Past Surgical History:  Procedure Laterality Date  . TONSILLECTOMY    . WISDOM TOOTH EXTRACTION     Family History:  Family History  Problem Relation Age of Onset  . Hypertension Father    Family Psychiatric  History: see H&P Social History:  Social History   Substance and Sexual Activity  Alcohol Use Yes  . Alcohol/week: 7.0 standard drinks  . Types: 7 Glasses of wine per week   Comment: Hx ETOH     Social History   Substance and Sexual Activity  Drug Use No    Social History   Socioeconomic History  . Marital  status: Single    Spouse name: Not on file  . Number of children: Not on file  . Years of education: Not on file  . Highest education level: Not on file  Occupational History  . Not on file  Social Needs  . Financial resource strain: Not on file  . Food insecurity:    Worry: Not on file    Inability: Not on file  . Transportation needs:    Medical: Not on file    Non-medical: Not on file  Tobacco Use  . Smoking status: Current Every Day Smoker    Packs/day: 1.00    Types: Cigarettes  . Smokeless tobacco: Never Used  Substance and Sexual Activity  . Alcohol use: Yes    Alcohol/week: 7.0 standard drinks    Types: 7 Glasses of wine per week    Comment: Hx ETOH  . Drug use: No  . Sexual activity: Yes    Birth control/protection: Condom  Lifestyle  . Physical activity:    Days per week: Not on file    Minutes per session: Not on file  . Stress: Not on file  Relationships  . Social connections:    Talks on phone: Not on file    Gets together: Not on file    Attends religious service: Not on file  Active member of club or organization: Not on file    Attends meetings of clubs or organizations: Not on file    Relationship status: Not on file  Other Topics Concern  . Not on file  Social History Narrative  . Not on file   Additional Social History: see H&P  Sleep: Good  Appetite:  Fair  Current Medications: Current Facility-Administered Medications  Medication Dose Route Frequency Provider Last Rate Last Dose  . acetaminophen (TYLENOL) tablet 650 mg  650 mg Oral Q6H PRN Clapacs, John T, MD      . alum & mag hydroxide-simeth (MAALOX/MYLANTA) 200-200-20 MG/5ML suspension 30 mL  30 mL Oral Q4H PRN Clapacs, John T, MD      . citalopram (CELEXA) tablet 20 mg  20 mg Oral Daily Ammiel Guiney, MD   20 mg at 02/14/18 0801  . hydrOXYzine (ATARAX/VISTARIL) tablet 50 mg  50 mg Oral TID PRN Clapacs, Jackquline Denmark, MD   50 mg at 02/14/18 0801  . ibuprofen (ADVIL,MOTRIN) tablet 600 mg  600 mg  Oral Q6H PRN Peniel Biel, MD   600 mg at 02/14/18 0655  . LORazepam (ATIVAN) tablet 1 mg  1 mg Oral Q6H PRN Atha Mcbain, MD      . magnesium hydroxide (MILK OF MAGNESIA) suspension 30 mL  30 mL Oral Daily PRN Clapacs, John T, MD      . nicotine (NICODERM CQ - dosed in mg/24 hours) patch 21 mg  21 mg Transdermal Daily Hessie Knows, MD   21 mg at 02/14/18 0803  . QUEtiapine (SEROQUEL) tablet 100 mg  100 mg Oral QHS Clapacs, Jackquline Denmark, MD   100 mg at 02/13/18 2105  . QUEtiapine (SEROQUEL) tablet 50 mg  50 mg Oral BID PRN Edlyn Rosenburg, MD      . thiamine (VITAMIN B-1) tablet 100 mg  100 mg Oral Daily Clapacs, John T, MD   100 mg at 02/14/18 0801    Lab Results:  No results found for this or any previous visit (from the past 48 hour(s)).  Blood Alcohol level:  Lab Results  Component Value Date   ETH 131 (H) 02/10/2018   ETH 35 (H) 01/07/2018    Metabolic Disorder Labs: Lab Results  Component Value Date   HGBA1C 4.5 (L) 02/11/2018   MPG 82.45 02/11/2018   MPG 93.93 12/05/2017   No results found for: PROLACTIN Lab Results  Component Value Date   CHOL 142 02/11/2018   TRIG 98 02/11/2018   HDL 52 02/11/2018   CHOLHDL 2.7 02/11/2018   VLDL 20 02/11/2018   LDLCALC 70 02/11/2018   LDLCALC 69 12/05/2017    Physical Findings: AIMS: Facial and Oral Movements Muscles of Facial Expression: None, normal Lips and Perioral Area: None, normal Jaw: None, normal Tongue: None, normal,Extremity Movements Upper (arms, wrists, hands, fingers): None, normal Lower (legs, knees, ankles, toes): None, normal, Trunk Movements Neck, shoulders, hips: None, normal, Overall Severity Severity of abnormal movements (highest score from questions above): None, normal Incapacitation due to abnormal movements: None, normal Patient's awareness of abnormal movements (rate only patient's report): No Awareness, Dental Status Current problems with teeth and/or dentures?: No Does patient usually wear dentures?: No  CIWA:   CIWA-Ar Total: 3 COWS:  COWS Total Score: 0  Musculoskeletal: Strength & Muscle Tone: within normal limits Gait & Station: normal Patient leans: N/A  Psychiatric Specialty Exam: Physical Exam  Nursing note and vitals reviewed. Respiratory: Effort normal.    Review of Systems  Psychiatric/Behavioral:  Negative for hallucinations and suicidal ideas.    Blood pressure 123/90, pulse 93, temperature 98.6 F (37 C), temperature source Oral, resp. rate 18, height 5\' 6"  (1.676 m), weight 83 kg, last menstrual period 02/07/2018, SpO2 99 %.Body mass index is 29.54 kg/m.  General Appearance: Fairly Groomed  Eye Contact:  Fair  Speech:  Clear and Coherent  Volume:  Normal  Mood:  Anxious and Irritable  Affect:  Constricted, Depressed and congruent  Thought Process:  Goal Directed  Orientation:  Full (Time, Place, and Person)  Thought Content:  focused on discharge  Suicidal Thoughts:  pt denies  Homicidal Thoughts:  pt denies  Memory:  grossly intact  Judgement:  Fair  Insight:  Lacking  Psychomotor Activity:  Normal  Concentration:  Concentration: Fair and Attention Span: Fair  Recall:  grossly intact  Fund of Knowledge:  Fair  Language:  Fair  Akathisia:  No  AIMS (if indicated):   0  Assets:  Manufacturing systems engineerCommunication Skills Resilience  ADL's:  Intact  Cognition:  WNL  Sleep:  Number of Hours: 7.25     Treatment Plan Summary: Daily contact with patient to assess and evaluate symptoms and progress in treatment, Medication management and Plan see below  27 yo female with history of depression and ADHD admitted after being petitioned for IVC by her mother for erratic behaviors and concern for SI with plan to OD.  Pt denies being suicidal.  She states this is her mother's way of punishing her for not giving in to her mother's demands for samples of pt's adderall.  Pt denies SI, HI, AH, VH and substance abuse issues.  Although, after a chart review, I can see that pt has been seen in the  Emergency department multiple times for alcohol intoxication and even [facility] in May 2019 for a presumed amphetamine overdose and alcohol intoxication; again in May 2019 for problems related to ecstasy and substance abuse; In May 2018, [facility] for alcohol withdrawal delirium, [facility]; June 2019 for alcohol withdrawal, and [facility] for self-inflicted cut.  Pt doesn't appear to be doing well.  She is homeless and unemployed.  She appears to be struggling to stay abstinent from alcohol and drugs.  She is in need of further evaluation and treatment.  Although pt denies excessive alcohol use, she was put her on CIWA due to her history of alcohol abuse and complicated withdrawal symptoms as indicated by previous ED visits.    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 Lynett FishHansen Su.    Mood disorder, unspecified (rule out bipolar disorder; substance induced mood disorder; vs Major depressive disorder) -continue seroquel 100 mg nightly -increase celexa 20 mg daily after her QTc back to 457 on 12/5.    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 I also concerned about abuse amphetamine in her case.   -Sincerely recommend to reassess pt's need for Stimulants after she has sustained sobriety from alcohol. Also, recommend NOT to exceed the FDA approved maximal dose of adderall 40mg  daily, if pt truly has ADD. Or consider stop prescribing this risky  meds.  Recommend to inform Dr. Lynett Fish.   Alcohol use disorder -CIWA; monitor for alcohol withdrawal symptoms.  No active Sx, but pt tends to ask for  ativan.  -vitamin B1 daily  Tobacco use disorder -nicotine patch -smoking cessation encouraged  Observation Level/Precautions:  15 minute checks  Laboratory:  see orders  Psychotherapy:  group  Medications:  See MAR  Consultations:  N/A  Estimated LOS: 3-5 days; pt will return to her friend, Neva Seat, house after discharge.   [name] [phone] consent to speak to [name] is on the chart.  Signed consent for [facility] is also on the chart.      Alexza Norbeck, MD 02/14/2018, 1:18 PM

## 2018-02-14 NOTE — [facility] Group Notes (Signed)
LCSW Group Therapy Note 02/14/2018 1:15pm  Type of Therapy and Topic: Group Therapy: Feelings Around Returning Home & Establishing a Supportive Framework and Supporting Oneself When Supports Not Available  Participation Level: Active  Description of Group:  Patients first processed thoughts and feelings about upcoming discharge. These included fears of upcoming changes, lack of change, new living environments, judgements and expectations from others and overall stigma of mental health issues. The group then discussed the definition of a supportive framework, what that looks and feels like, and how do to discern it from an unhealthy non-supportive network. The group identified different types of supports as well as what to do when your family/friends are less than helpful or unavailable  Therapeutic Goals  1. Patient will identify one healthy supportive network that they can use at discharge. 2. Patient will identify one factor of a supportive framework and how to tell it from an unhealthy network. 3. Patient able to identify one coping skill to use when they do not have positive supports from others. 4. Patient will demonstrate ability to communicate their needs through discussion and/or role plays.  Summary of Patient Progress:  The patient reported she is having "withdrawals." Pt engaged during group session. As patients processed their anxiety about discharge and described healthy supports patient shared she is ready to be discharge. She reported "I got my whole game plan." She listed her boyfriend and friends as her main support system.  Patients identified at least one self-care tool they were willing to use after discharge; make people laugh, draw, and write poetry.   Therapeutic Modalities Cognitive Behavioral Therapy Motivational Interviewing   Marie Mckenzie  CUEBAS-COLON, LCSW 02/14/2018 12:51 PM

## 2018-02-14 NOTE — Progress Notes (Signed)
Pt. Reports tooth pain this morning. Call placed to on call MD for PRN pain medicaiton, see MAR.

## 2018-02-14 NOTE — Progress Notes (Signed)
D: Upon this writer's assessment the patient presents disheveled with a flat facial expression, but pleasant and engages appropriately. Pt. Continues to insist she is here, because of, "my mother wanting to steal my medications" and does not feel she is responsible for being here. Pt. Reports she is, "just waiting to discharge". Pt. Reports anxiety and depression as, "10/10" and "7/10" respectively. Pt. Denies si/hi/avh, contracts for safety. No behavioral issues to report.    A: Q x 15 minute observation checks were completed for safety. Patient was provided with education, but needs reinforcement.  Patient was given/offered medications per orders. Patient  was encourage to attend groups, participate in unit activities and continue with plan of care. Pt. Chart and plans of care reviewed. Pt. Given support and encouragement.   R: Patient is complaint with medication and unit procedures with direction and encouragement. Pt. Attends snacks, groups, and unit activities appropriately. Pt. Monitored per CIWA per MD orders. Pt. Insight and judgment lacking.             Precautionary checks every 15 minutes for safety maintained, room free of safety hazards, patient sustains no injury or falls during this shift. Will endorse care to next shift.

## 2018-02-14 NOTE — Progress Notes (Signed)
D- Patient alert and oriented. Patient presents in a pleasant mood on assessment stating that she slept alright last night and had complaints of tooth pain on the left side that she had already received pain medication for prior to this writer's shift. Patient endorses depression and anxiety stating that her tooth is what's making her depressed and rates her anxiety a "10/10", but can not state why she is anxious. Patient did request anxiety medication from Patient denies SI, HI, AVH, at this time. Patient's goal for today is to "relax"  A- Scheduled medications administered to patient, per MD orders. Support and encouragement provided.  Routine safety checks conducted every 15 minutes.  Patient informed to notify staff with problems or concerns.  R- No adverse drug reactions noted. Patient contracts for safety at this time. Patient compliant with medications and treatment plan. Patient receptive, calm, and cooperative. Patient interacts well with others on the unit.  Patient remains safe at this time.

## 2018-02-14 NOTE — Plan of Care (Signed)
Pt. Is complaint with medications. Pt. Denies si/hi/avh, verbally contracts for safety. Pt. Reports she can remain safe while on the unit.    Problem: Medication: Goal: Compliance with prescribed medication regimen will improve Outcome: Progressing   Problem: Self-Concept: Goal: Ability to disclose and discuss suicidal ideas will improve Outcome: Progressing   Problem: Safety: Goal: Ability to remain free from injury will improve Outcome: Progressing

## 2018-02-15 MED ORDER — FOLIC ACID 1 MG PO TABS: 1.0000 mg | ORAL_TABLET | Freq: Every day | ORAL | 1 refills | Status: DC

## 2018-02-15 MED ORDER — HYDROXYZINE HCL 50 MG PO TABS: 50.0000 mg | ORAL_TABLET | Freq: Every day | ORAL | 0 refills | Status: DC | PRN

## 2018-02-15 MED ORDER — CITALOPRAM HYDROBROMIDE 20 MG PO TABS: 20.0000 mg | ORAL_TABLET | Freq: Every day | ORAL | Status: DC

## 2018-02-15 MED ORDER — THIAMINE HCL 100 MG PO TABS: 100.0000 mg | ORAL_TABLET | Freq: Every day | ORAL | 1 refills | Status: DC

## 2018-02-15 NOTE — Progress Notes (Signed)
Recreation Therapy Notes  INPATIENT RECREATION TR PLAN  Patient Details Name: Marie Mckenzie MRN: [id number] DOB: 1991-02-18 Today's Date: 02/15/2018  Rec Therapy Plan Is patient appropriate for Therapeutic Recreation?: Yes Treatment times per week: at least 3 Estimated Length of Stay: 5-7 days TR Treatment/Interventions: Group participation (Comment)  Discharge Criteria Pt will be discharged from therapy if:: Discharged Treatment plan/goals/alternatives discussed and agreed upon by:: Patient/family  Discharge Summary Short term goals set: Patient will engage in groups without prompting or encouragement from LRT x3 group sessions within 5 recreation therapy group sessions Short term goals met: Adequate for discharge Progress toward goals comments: Groups attended Which groups?: Other (Comment)(Self-care, Team work) Reason goals not met: N/A Therapeutic equipment acquired: N/A Reason patient discharged from therapy: Discharge from hospital Pt/family agrees with progress & goals achieved: Yes Date patient discharged from therapy: 02/15/18   Vallorie Niccoli 02/15/2018, 1:35 PM

## 2018-02-15 NOTE — Plan of Care (Signed)
Pt. Compliant with medications. Pt. Denies si/hi, verbally is able to contract for safety. Pt. Reports she can remain safe while on the unit.    Problem: Medication: Goal: Compliance with prescribed medication regimen will improve Outcome: Progressing   Problem: Self-Concept: Goal: Ability to disclose and discuss suicidal ideas will improve Outcome: Progressing   Problem: Self-Concept: Goal: Ability to disclose and discuss suicidal ideas will improve Outcome: Progressing   Problem: Safety: Goal: Ability to remain free from injury will improve Outcome: Progressing

## 2018-02-15 NOTE — [facility] Suicide Risk Assessment (Signed)
[facility] INPATIENT:  Family/Significant Other Suicide Prevention Education  Suicide Prevention Education:  Education Completed; Marie Mckenzie Mckenzie(pt friend)  has been identified by the patient as the family member/significant other with whom the patient will be residing, and identified as the person(s) who will aid the patient in the event of a mental health crisis (suicidal ideations/suicide attempt).  With written consent from the patient, the family member/significant other has been provided the following suicide prevention education, prior to the and/or following the discharge of the patient.  The suicide prevention education provided includes the following:  Suicide risk factors  Suicide prevention and interventions  National Suicide Hotline telephone number  [facility] assessment telephone number  [facility] Emergency Assistance 911  [facility] and/or Residential Mobile Crisis Unit telephone number  Request made of family/significant other to:  Remove weapons (e.g., guns, rifles, knives), all items previously/currently identified as safety concern.    Remove drugs/medications (over-the-counter, prescriptions, illicit drugs), all items previously/currently identified as a safety concern.  The family member/significant other verbalizes understanding of the suicide prevention education information provided.  The family member/significant other agrees to remove the items of safety concern listed above. Marie Mckenzie reports pt will be returning to his home and he can assist with transportation at discharge. Marie Mckenzie denies pt having access to any guns or weapons in the home and has no reservation about her returning to his home.  CSW encouraged Marie Mckenzie to call 911 or bring pt to nearest emergency department if pt needs additional medical attention.   Marie Mckenzie 02/15/2018, 11:55 AM

## 2018-02-15 NOTE — Progress Notes (Signed)
Recreation Therapy Notes   Date: 02/15/2018  Time: 9:30 am  Location: Craft Room  Behavioral response: Appropriate  Intervention Topic: Self-Care  Discussion/Intervention:  Group content today was focused on Self-Care. The group defined self-care and some positive ways they care for themselves. Individuals expressed ways and reasons why they neglected any self-care in the past. Patients described ways to improve self-care in the future. The group explained what could happen if they did not do any self-care activities at all. The group participated in the intervention "self-care assessment" where they had a chance to discover some of their weaknesses and strengths in self- care. Patient came up with a self-care plan to improve themselves in the future.  Clinical Observations/Feedback:  Patient came to group and identified that she soaks her feet as part of her self-care. She explained that sometimes she takes on others problems and that prevents her from taking care of her self. Individual participated in group and was social with peers and staff while participating in the intervention.  Marie Mckenzie LRT/CTRS         Marie Mckenzie 02/15/2018 11:59 AM

## 2018-02-15 NOTE — Tx Team (Signed)
Interdisciplinary Treatment and Diagnostic Plan Update  02/15/2018 Time of Session: 830am Marie Mckenzie MRN: [id number]  Principal Diagnosis: Unspecified mood (affective) disorder (HCC)  Secondary Diagnoses: Principal Problem:   Unspecified mood (affective) disorder (HCC)   Current Medications:  Current Facility-Administered Medications  Medication Dose Route Frequency Provider Last Rate Last Dose  . acetaminophen (TYLENOL) tablet 650 mg  650 mg Oral Q6H PRN Clapacs, Jackquline Denmark, MD   650 mg at 02/15/18 0831  . alum & mag hydroxide-simeth (MAALOX/MYLANTA) 200-200-20 MG/5ML suspension 30 mL  30 mL Oral Q4H PRN Clapacs, John T, MD      . citalopram (CELEXA) tablet 20 mg  20 mg Oral Daily He, Jun, MD   20 mg at 02/15/18 0830  . hydrOXYzine (ATARAX/VISTARIL) tablet 50 mg  50 mg Oral TID PRN Clapacs, Jackquline Denmark, MD   50 mg at 02/15/18 0831  . ibuprofen (ADVIL,MOTRIN) tablet 600 mg  600 mg Oral Q6H PRN He, Jun, MD   600 mg at 02/14/18 2108  . magnesium hydroxide (MILK OF MAGNESIA) suspension 30 mL  30 mL Oral Daily PRN Clapacs, John T, MD      . nicotine (NICODERM CQ - dosed in mg/24 hours) patch 21 mg  21 mg Transdermal Daily Hessie Knows, MD   21 mg at 02/15/18 0830  . QUEtiapine (SEROQUEL) tablet 100 mg  100 mg Oral QHS Clapacs, Jackquline Denmark, MD   100 mg at 02/14/18 2108  . QUEtiapine (SEROQUEL) tablet 50 mg  50 mg Oral BID PRN He, Jun, MD   50 mg at 02/14/18 2219  . thiamine (VITAMIN B-1) tablet 100 mg  100 mg Oral Daily Clapacs, John T, MD   100 mg at 02/15/18 0830   PTA Medications: Medications Prior to Admission  Medication Sig Dispense Refill Last Dose  . amphetamine-dextroamphetamine (ADDERALL) 20 MG tablet Take 1 tablet (20 mg total) by mouth daily. 30 tablet 0   . QUEtiapine (SEROQUEL) 100 MG tablet Take 1 tablet (100 mg total) by mouth at bedtime. 30 tablet 0   . traZODone (DESYREL) 100 MG tablet Take 1 tablet (100 mg total) by mouth at bedtime as needed for sleep. 30 tablet 0   .  [DISCONTINUED] citalopram (CELEXA) 10 MG tablet Take 1 tablet (10 mg total) by mouth daily. 30 tablet 0   . [DISCONTINUED] folic acid (FOLVITE) 1 MG tablet Take 1 tablet (1 mg total) by mouth daily. (Patient not taking: Reported on 12/04/2017) 30 tablet 3 Not Taking at Unknown time  . [DISCONTINUED] hydrOXYzine (ATARAX/VISTARIL) 50 MG tablet Take 1 tablet (50 mg total) by mouth 3 (three) times daily as needed for anxiety. 60 tablet 0   . [DISCONTINUED] thiamine 100 MG tablet Take 1 tablet (100 mg total) by mouth daily.       Patient Stressors: Medication change or noncompliance Substance abuse  Patient Strengths: Ability for insight Capable of independent living Motivation for treatment/growth  Treatment Modalities: Medication Management, Group therapy, Case management,  1 to 1 session with clinician, Psychoeducation, Recreational therapy.   Physician Treatment Plan for Primary Diagnosis: Unspecified mood (affective) disorder (HCC) Long Term Goal(s): Improvement in symptoms so as ready for discharge Improvement in symptoms so as ready for discharge   Short Term Goals: Ability to identify changes in lifestyle to reduce recurrence of condition will improve Ability to verbalize feelings will improve Ability to disclose and discuss suicidal ideas Ability to demonstrate self-control will improve Ability to identify and develop effective coping behaviors will improve  Compliance with prescribed medications will improve Ability to identify triggers associated with substance abuse/mental health issues will improve Ability to identify changes in lifestyle to reduce recurrence of condition will improve Ability to verbalize feelings will improve Ability to disclose and discuss suicidal ideas Ability to demonstrate self-control will improve Ability to identify and develop effective coping behaviors will improve Compliance with prescribed medications will improve Ability to identify triggers  associated with substance abuse/mental health issues will improve  Medication Management: Evaluate patient's response, side effects, and tolerance of medication regimen.  Therapeutic Interventions: 1 to 1 sessions, Unit Group sessions and Medication administration.  Evaluation of Outcomes: Progressing  Physician Treatment Plan for Secondary Diagnosis: Principal Problem:   Unspecified mood (affective) disorder (HCC)  Long Term Goal(s): Improvement in symptoms so as ready for discharge Improvement in symptoms so as ready for discharge   Short Term Goals: Ability to identify changes in lifestyle to reduce recurrence of condition will improve Ability to verbalize feelings will improve Ability to disclose and discuss suicidal ideas Ability to demonstrate self-control will improve Ability to identify and develop effective coping behaviors will improve Compliance with prescribed medications will improve Ability to identify triggers associated with substance abuse/mental health issues will improve Ability to identify changes in lifestyle to reduce recurrence of condition will improve Ability to verbalize feelings will improve Ability to disclose and discuss suicidal ideas Ability to demonstrate self-control will improve Ability to identify and develop effective coping behaviors will improve Compliance with prescribed medications will improve Ability to identify triggers associated with substance abuse/mental health issues will improve     Medication Management: Evaluate patient's response, side effects, and tolerance of medication regimen.  Therapeutic Interventions: 1 to 1 sessions, Unit Group sessions and Medication administration.  Evaluation of Outcomes: Progressing   RN Treatment Plan for Primary Diagnosis: Unspecified mood (affective) disorder (HCC) Long Term Goal(s): Knowledge of disease and therapeutic regimen to maintain health will improve  Short Term Goals: Ability to  participate in decision making will improve, Ability to verbalize feelings will improve, Ability to disclose and discuss suicidal ideas and Compliance with prescribed medications will improve  Medication Management: RN will administer medications as ordered by provider, will assess and evaluate patient's response and provide education to patient for prescribed medication. RN will report any adverse and/or side effects to prescribing provider.  Therapeutic Interventions: 1 on 1 counseling sessions, Psychoeducation, Medication administration, Evaluate responses to treatment, Monitor vital signs and CBGs as ordered, Perform/monitor CIWA, COWS, AIMS and Fall Risk screenings as ordered, Perform wound care treatments as ordered.  Evaluation of Outcomes: Progressing   LCSW Treatment Plan for Primary Diagnosis: Unspecified mood (affective) disorder (HCC) Long Term Goal(s): Safe transition to appropriate next level of care at discharge, Engage patient in therapeutic group addressing interpersonal concerns.  Short Term Goals: Engage patient in aftercare planning with referrals and resources  Therapeutic Interventions: Assess for all discharge needs, 1 to 1 time with Social worker, Explore available resources and support systems, Assess for adequacy in community support network, Educate family and significant other(s) on suicide prevention, Complete Psychosocial Assessment, Interpersonal group therapy.  Evaluation of Outcomes: Progressing   Progress in Treatment: Attending groups: Yes. Participating in groups: Yes. Taking medication as prescribed: Yes. Toleration medication: Yes. Family/Significant other contact made: Yes, individual(s) contacted:  Margarette Canadaonald Lloyd Patient understands diagnosis: Yes. Discussing patient identified problems/goals with staff: Yes. Medical problems stabilized or resolved: Yes. Denies suicidal/homicidal ideation: Yes. Issues/concerns per patient self-inventory: No. Other:  NA  New problem(s)  identified: No, Describe:  None reported  New Short Term/Long Term Goal(s):"To get out of here"  Patient Goals:  "To get out of here"  Discharge Plan or Barriers: Pt will return home and follow up with outpatient treatment  Reason for Continuation of Hospitalization: Medication stabilization  Estimated Length of Stay: Pt will discharge on 02/15/18 Recreational Therapy: Patient Stressors: Family Patient Goal: Patient will engage in groups without prompting or encouragement from LRT x3 group sessions within 5 recreation therapy group sessions  Attendees: Patient: 02/15/2018 1:40 PM  Physician: Ardeen Fillers, MD 02/15/2018 1:40 PM  Nursing: Ellwood Dense 02/15/2018 1:40 PM  RN Care Manager: 02/15/2018 1:40 PM  Social Worker: Lowella Dandy, LCSW 02/15/2018 1:40 PM  Recreational Therapist: Danella Deis. Outlaw CTRS, LRT 02/15/2018 1:40 PM  Other: Daleen Squibb, LCSW 02/15/2018 1:40 PM  Other:  02/15/2018 1:40 PM  Other: 02/15/2018 1:40 PM    Scribe for Treatment Team: Suzan Slick, LCSW 02/15/2018 1:40 PM
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
## 2018-02-15 NOTE — Progress Notes (Signed)
  [facility] :  Will you be returning to the same living situation after discharge:  No. pt is living with friend(Donald) At discharge, do you have transportation home?: Yes,  Donald Lloyd(friend) will pick up at discharge Do you have the ability to pay for your medications: No. CSW will refer pt to [place] medication management clinic  Release of information consent forms completed and in the chart;  Patient's signature needed at discharge.  Patient to Follow up at: Follow-up Information    [facility] Go on 02/17/2018.   Why:  Please follow up with Dr. Lynett FishHansen Su on Wednesday, February 17, 2018 at 10:40am. Thank you. Contact information: [address] [place] [address] [phone]        [facility] Go on 02/22/2018.   Why:  [facility] Harvey [facility] at 7:15am on Monday, February 22, 2018. Please bring photo ID, medication, insurance card. Thank you. Contact information: [address] [place] [address] [phone]           Next level of care provider has access to [facility] Link:no  Safety Planning and Suicide Prevention discussed: Yes,  Margarette CanadaDonald Lloyd  Have you used any form of tobacco in the last 30 days? (Cigarettes, Smokeless Tobacco, Cigars, and/or Pipes): Yes  Has patient been referred to the Quitline?: Pt counseled by physician on smoking cessation  Patient has been referred for addiction treatment: N/A  Suzan SlickDARREN T Brixton Schnapp, LCSW 02/15/2018, 11:44 AM

## 2018-02-15 NOTE — Progress Notes (Signed)
D: Patient is aware of  Discharge this shift .Patient denies suicidal /homicidal ideations. Patient received all belongings brought in  A Storage medications given back to patient . Writer reviewed Discharge Summary, Suicide Risk Assessment, and Transitional Record. Patient also received Prescriptions   from  MD.. Aware  Of follow up appointment . R: Patient left unit with no questions  Or concerns  With family

## 2018-02-15 NOTE — Progress Notes (Signed)
D: Pt denies SI/HI/AVH, contracts verbally for safety. Pt is pleasant and cooperative, engages appropriately. Pt. Continues to report depression and anxiety as, "4/10" and "7/10" respectively. Pt. Reports depression and anxiety about her medications. Pt. When speaking to this writer briefly irritable when talking about her conversation with the doctor today. Pt. Expresses she is not happy that she is not able to have adderall or klonopin ordered for her to take. Pt. Also expresses displeasure from ativan not being able to be utilized by her as needed. Pt. Reported tooth pain being managed utilizing MD PRN orders. No behavioral issues to report for the most part. Pt. Reports mood is anxious and depressed, but, "I'm mostly fine". Pt. Affect frequently flat, but cheerful around peers. Pt. Continues to fixate on ready for discharge.    A: Q x 15 minute observation checks were completed for safety. Patient was provided with education, but needs reinforcement.  Patient was given/offered medications per orders. Patient  was encourage to attend groups, participate in unit activities and continue with plan of care. Pt. Chart and plans of care reviewed. Pt. Given support and encouragement.   R: Patient is complaint with medication and unit procedures with direction and encouragement. Pt. Attends snacks and groups appropriately. Pt. Frequently visible around the unit socializing with peers. Pt. Affect when interacting with peers is cheerful and smiles frequently. Pt. Monitored utilizing CIWA scoring.             Precautionary checks every 15 minutes for safety maintained, room free of safety hazards, patient sustains no injury or falls during this shift. Will endorse care to next shift.

## 2018-02-15 NOTE — [facility] Group Notes (Signed)
[facility] LCSW Group Therapy Note  Date/Time: 02/15/18, 1300  Type of Therapy and Topic:  Group Therapy:  Overcoming Obstacles  Participation Level:  Did not attend  Description of Group:    In this group patients will be encouraged to explore what they see as obstacles to their own wellness and recovery. They will be guided to discuss their thoughts, feelings, and behaviors related to these obstacles. The group will process together ways to cope with barriers, with attention given to specific choices patients can make. Each patient will be challenged to identify changes they are motivated to make in order to overcome their obstacles. This group will be process-oriented, with patients participating in exploration of their own experiences as well as giving and receiving support and challenge from other group members.  Therapeutic Goals: 1. Patient will identify personal and current obstacles as they relate to admission. 2. Patient will identify barriers that currently interfere with their wellness or overcoming obstacles.  3. Patient will identify feelings, thought process and behaviors related to these barriers. 4. Patient will identify two changes they are willing to make to overcome these obstacles:    Summary of Patient Progress      Therapeutic Modalities:   Cognitive Behavioral Therapy Solution Focused Therapy Motivational Interviewing Relapse Prevention Therapy  Greg Audrey Eller, LCSW 
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
## 2018-03-17 ENCOUNTER — Telehealth: Payer: Self-pay | Admitting: Pharmacy Technician

## 2018-03-17 NOTE — Telephone Encounter (Signed)
Patient failed to provide proof of income.  No additional medication assistance will be provided by [facility] without the required proof of income documentation.  Patient notified by letter.  Eilis Chestnutt J. Burleigh Brockmann Care Manager [facility] 

## 2018-04-13 ENCOUNTER — Other Ambulatory Visit: Payer: Self-pay

## 2018-04-13 ENCOUNTER — Encounter: Payer: Self-pay | Admitting: Emergency Medicine

## 2018-04-13 ENCOUNTER — Emergency Department
Admission: EM | Admit: 2018-04-13 | Discharge: 2018-04-13 | Disposition: A | Discharge status: Home / Self Care | Payer: Self-pay | Attending: Emergency Medicine | Admitting: Emergency Medicine

## 2018-04-13 DIAGNOSIS — F17210 Nicotine dependence, cigarettes, uncomplicated: Secondary | ICD-10-CM | POA: Insufficient documentation

## 2018-04-13 DIAGNOSIS — F419 Anxiety disorder, unspecified: Secondary | ICD-10-CM | POA: Insufficient documentation

## 2018-04-13 DIAGNOSIS — F909 Attention-deficit hyperactivity disorder, unspecified type: Secondary | ICD-10-CM | POA: Insufficient documentation

## 2018-04-13 DIAGNOSIS — F1020 Alcohol dependence, uncomplicated: Secondary | ICD-10-CM | POA: Insufficient documentation

## 2018-04-13 DIAGNOSIS — F319 Bipolar disorder, unspecified: Secondary | ICD-10-CM | POA: Insufficient documentation

## 2018-04-13 DIAGNOSIS — I10 Essential (primary) hypertension: Secondary | ICD-10-CM | POA: Insufficient documentation

## 2018-04-13 DIAGNOSIS — Z79899 Other long term (current) drug therapy: Secondary | ICD-10-CM | POA: Insufficient documentation

## 2018-04-13 DIAGNOSIS — R21 Rash and other nonspecific skin eruption: Secondary | ICD-10-CM | POA: Insufficient documentation

## 2018-04-13 LAB — CBC WITH DIFFERENTIAL/PLATELET
Abs Immature Granulocytes: 0.01 10*3/uL (ref 0.00–0.07)
Basophils Absolute: 0.1 10*3/uL (ref 0.0–0.1)
Basophils Relative: 1 %
Eosinophils Absolute: 0.2 10*3/uL (ref 0.0–0.5)
Eosinophils Relative: 2 %
HCT: 36.8 % (ref 36.0–46.0)
Hemoglobin: 12.6 g/dL (ref 12.0–15.0)
IMMATURE GRANULOCYTES: 0 %
Lymphocytes Relative: 16 %
Lymphs Abs: 1.4 10*3/uL (ref 0.7–4.0)
MCH: 32.6 pg (ref 26.0–34.0)
MCHC: 34.2 g/dL (ref 30.0–36.0)
MCV: 95.1 fL (ref 80.0–100.0)
Monocytes Absolute: 1 10*3/uL (ref 0.1–1.0)
Monocytes Relative: 12 %
Neutro Abs: 5.8 10*3/uL (ref 1.7–7.7)
Neutrophils Relative %: 69 %
Platelets: 228 10*3/uL (ref 150–400)
RBC: 3.87 MIL/uL (ref 3.87–5.11)
RDW: 13.5 % (ref 11.5–15.5)
WBC: 8.5 10*3/uL (ref 4.0–10.5)
nRBC: 0 % (ref 0.0–0.2)

## 2018-04-13 LAB — COMPREHENSIVE METABOLIC PANEL
ALT: 22 U/L (ref 0–44)
AST: 32 U/L (ref 15–41)
Albumin: 4 g/dL (ref 3.5–5.0)
Alkaline Phosphatase: 54 U/L (ref 38–126)
Anion gap: 9 (ref 5–15)
BUN: 16 mg/dL (ref 6–20)
CO2: 23 mmol/L (ref 22–32)
Calcium: 9.2 mg/dL (ref 8.9–10.3)
Chloride: 104 mmol/L (ref 98–111)
Creatinine, Ser: 0.9 mg/dL (ref 0.44–1.00)
GFR calc Af Amer: >60 mL/min (ref >60)
Glucose, Bld: 81 mg/dL (ref 70–99)
Potassium: 3.6 mmol/L (ref 3.5–5.1)
Sodium: 136 mmol/L (ref 135–145)
Total Bilirubin: 0.7 mg/dL (ref 0.3–1.2)
Total Protein: 7 g/dL (ref 6.5–8.1)

## 2018-04-13 LAB — URINALYSIS, COMPLETE (UACMP) WITH MICROSCOPIC
Bacteria, UA: NONE SEEN
Bilirubin Urine: NEGATIVE
Glucose, UA: NEGATIVE mg/dL
Hgb urine dipstick: NEGATIVE
Ketones, ur: NEGATIVE mg/dL
Leukocytes, UA: NEGATIVE
Nitrite: NEGATIVE
Protein, ur: NEGATIVE mg/dL
Specific Gravity, Urine: 1.012 (ref 1.005–1.030)
pH: 7 (ref 5.0–8.0)

## 2018-04-13 LAB — GROUP A STREP BY PCR: Group A Strep by PCR: NOT DETECTED

## 2018-04-13 LAB — LACTIC ACID, PLASMA: Lactic Acid, Venous: 1.5 mmol/L (ref 0.5–1.9)

## 2018-04-13 LAB — POCT PREGNANCY, URINE: Preg Test, Ur: NEGATIVE

## 2018-04-13 MED ORDER — SODIUM CHLORIDE 0.9 % IV BOLUS
1000.0000 mL | Freq: Once | INTRAVENOUS | Status: AC
  Administered 2018-04-13: 1000 mL via INTRAVENOUS

## 2018-04-13 MED ORDER — DEXAMETHASONE SODIUM PHOSPHATE 10 MG/ML IJ SOLN
10.0000 mg | Freq: Once | INTRAMUSCULAR | Status: AC
  Administered 2018-04-13: 10 mg via INTRAMUSCULAR
  Filled 2018-04-13: qty 1

## 2018-04-13 MED ORDER — PREDNISONE 10 MG PO TABS: ORAL_TABLET | ORAL | 0 refills | Status: DC

## 2018-04-13 NOTE — ED Notes (Signed)
First Nurse Note: Patient complaining of rash with pain and itching on her arms and back.  Present since yesterday.  Denies known allergies.  Patient states she is homeless.

## 2018-04-13 NOTE — ED Triage Notes (Signed)
Patient states she noticed rash yesterday with pain and itching.  Red raised rash noted across her back and the backs of both arms, worse on left arm.  Denies any known allergies.  Also complaining of congestion and cough.

## 2018-04-13 NOTE — ED Provider Notes (Signed)
[facility] Emergency Department Provider Note   ____________________________________________   First MD Initiated Contact with Patient 04/13/18 1008     (approximate)  I have reviewed the triage vital signs and the nursing notes.   HISTORY  Chief Complaint Rash   HPI Marie Mckenzie is a 28 y.o. female presents to the ED with complaint of rash that began yesterday with pain and itching.  Patient states that she noticed the rash on her back and also to both upper extremities.  She states that before coming to the ED she took Benadryl and use some cortisone cream without any relief.   She is unaware of any allergens and denies any new close, soaps or foods.  Patient currently states that she is homeless and the only thing that she has done is washed with soap and water.  She is unaware of any fever or chills.  She denies any difficulty moving her arms.  Patient also developed some upper respiratory congestion after her rash started.   Past Medical History:  Diagnosis Date  . ADHD (attention deficit hyperactivity disorder)   . Anxiety   . Hypertension   . Major depressive disorder     Patient Active Problem List   Diagnosis Date Noted  . Bipolar 1 disorder, manic, moderate (HCC) 02/11/2018  . Bipolar 1 disorder, manic, mild (HCC) 02/10/2018  . 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 Self-inflicted laceration of wrist 12/04/2017  . Severe recurrent major depression without psychotic features (HCC) 12/04/2017  . Hypokalemia 07/25/2016  . Elevated transaminase level 07/25/2016  . Leukopenia 07/25/2016  . Thrombocytopenia (HCC) 07/25/2016  . 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 Alcohol withdrawal (HCC) 07/24/2016  . Attention deficit hyperactivity disorder (ADHD) 02/11/2016  . Major depressive disorder, recurrent severe without psychotic features (HCC) 02/10/2016  . Suicidal ideation 02/10/2016  . Involuntary  commitment 02/10/2016  . Alcohol use disorder, severe, dependence (HCC) 02/10/2016  . Alcohol intoxication (HCC) 02/10/2016  . Unspecified mood (affective) disorder (HCC) 02/10/2016  . Tobacco use disorder 02/10/2016    Past Surgical History:  Procedure Laterality Date  . TONSILLECTOMY    . WISDOM TOOTH EXTRACTION      Prior to Admission medications   Medication Sig Start Date End Date Taking? Authorizing Provider  citalopram (CELEXA) 20 MG tablet Take 1 tablet (20 mg total) by mouth daily. 02/15/18   Hessie Knows, MD  folic acid (FOLVITE) 1 MG tablet Take 1 tablet (1 mg total) by mouth daily. 02/15/18   Hessie Knows, MD  hydrOXYzine (ATARAX/VISTARIL) 50 MG tablet Take 1 tablet (50 mg total) by mouth daily as needed for anxiety. 02/15/18   Hessie Knows, MD  predniSONE (DELTASONE) 10 MG tablet Take 6 tablets  today, on day 2 take 5 tablets, day 3 take 4 tablets, day 4 take 3 tablets, day 5 take  2 tablets and 1 tablet the last day 04/13/18   Tommi Rumps, PA-C  QUEtiapine (SEROQUEL) 100 MG tablet Take 1 tablet (100 mg total) by mouth at bedtime. 12/08/17   McNew, Ileene Hutchinson, MD  thiamine 100 MG tablet Take 1 tablet (100 mg total) by mouth daily. 02/15/18   Hessie Knows, MD    Allergies Patient has no known allergies.  Family History  Problem Relation Age of Onset  . Hypertension Father     Social History Social History   Tobacco Use  . Smoking status: Current Every Day Smoker    Packs/day: 1.00  Types: Cigarettes  . Smokeless tobacco: Never Used  Substance Use Topics  . Alcohol use: Yes    Alcohol/week: 7.0 standard drinks    Types: 7 Glasses of wine per week    Comment: Hx ETOH  . Drug use: No    Review of Systems Constitutional: No fever/chills ENT: Positive for mild upper respiratory symptoms. Cardiovascular: Denies chest pain. Respiratory: Denies shortness of breath. Musculoskeletal: Negative for muscle aches. Skin: Positive for rash. Neurological:  Negative for headaches, focal weakness or numbness. ___________________________________________   PHYSICAL EXAM:  VITAL SIGNS: ED Triage Vitals  Enc Vitals Group     BP 04/13/18 0855 (!) 144/102     Pulse Rate 04/13/18 0855 (!) 120     Resp 04/13/18 0855 15     Temp 04/13/18 0855 98.9 F (37.2 C)     Temp Source 04/13/18 0855 Oral     SpO2 04/13/18 0855 100 %     Weight 04/13/18 0858 175 lb (79.4 kg)     Height 04/13/18 0858 5\' 6"  (1.676 m)     Head Circumference --      Peak Flow --      Pain Score 04/13/18 0904 10     Pain Loc --      Pain Edu? --      Excl. in GC? --    Constitutional: Alert and oriented. Well appearing and in no acute distress. Eyes: Conjunctivae are normal.  Head: Atraumatic. Nose: No congestion/rhinnorhea. Mouth/Throat: Mucous membranes are moist.  Oropharynx non-erythematous.  No exudate and uvula is midline. Neck: No stridor.   Hematological/Lymphatic/Immunilogical: No cervical lymphadenopathy. Cardiovascular: Normal rate, regular rhythm. Grossly normal heart sounds.  Good peripheral circulation. Respiratory: Normal respiratory effort.  No retractions. Lungs CTAB. Gastrointestinal: Soft and nontender. No distention.  Musculoskeletal: His upper and lower extremities without any difficulty.  Patient is able to flex and extend her upper extremities without any restriction or increased pain. Neurologic:  Normal speech and language. No gross focal neurologic deficits are appreciated. No gait instability. Skin:  Skin is warm, dry and intact.  Patient has a erythematous macular rash over her upper extremities.  Her left posterior elbow is slightly edematous and more involved.  Rash is flat without vesicles or pustules noted.  Patient is still able to move the extremity without any difficulties.  On examination of the back there is some small areas that are involved across the thoracic portion.  There were no other areas of rash noted on the lower extremities.   There is no herald patch or target area noted. Psychiatric: Mood and affect are normal. Speech and behavior are normal.  ____________________________________________   LABS (all labs ordered are listed, but only abnormal results are displayed)  Labs Reviewed  URINALYSIS, COMPLETE (UACMP) WITH MICROSCOPIC - Abnormal; Notable for the following components:      Result Value   Color, Urine YELLOW (*)    APPearance HAZY (*)    Crystals PRESENT (*)    All other components within normal limits  GROUP A STREP BY PCR  CBC WITH DIFFERENTIAL/PLATELET  COMPREHENSIVE METABOLIC PANEL  LACTIC ACID, PLASMA  LACTIC ACID, PLASMA  POC URINE PREG, ED  POCT PREGNANCY, URINE    PROCEDURES  Procedure(s) performed: None  Procedures  Critical Care performed: No  ____________________________________________   INITIAL IMPRESSION / ASSESSMENT AND PLAN / ED COURSE  As part of my medical decision making, I reviewed the following data within the electronic MEDICAL RECORD NUMBER Notes from prior  ED visits and [place] Controlled Substance Database  Patient presents to the ED with complaint of rash that began yesterday.  She also developed an upper respiratory infection after she noticed the rash.  She denies any fever or chills.  She states that the rash is both itchy and painful at times.  She is taken Benadryl and use some cortisone cream prior to coming to the ED.  She denies any contact with allergens to her knowledge.  Rash is confined to upper extremity with the left elbow being most involved.  There is also some involvement posterior torso.  No rash noted on the lower extremities.  Face was also spared.  Patient was given Decadron 10 mg IM until she is able to pick up the prescription for prednisone.  She was told that she may continue taking the Benadryl as needed for itching and using her cortisone cream.  She is return to the emergency department if any severe worsening of her symptoms or urgent concerns.  We  discussed following up with a dermatologist however she states that she is homeless and does not have any money.  ____________________________________________   FINAL CLINICAL IMPRESSION(S) / ED DIAGNOSES  Final diagnoses:  Rash and nonspecific skin eruption     ED Discharge Orders         Ordered    predniSONE (DELTASONE) 10 MG tablet     04/13/18 1426           Note:  This document was prepared using Dragon voice recognition software and may include unintentional dictation errors.    Tommi RumpsSummers,  L, PA-C 04/13/18 1726    Emily FilbertWilliams, Jonathan E, MD 04/14/18 [phone]

## 2018-04-13 NOTE — ED Triage Notes (Signed)
Patient also complaining of nasal congestion, cough, and sore throat all starting at the same time as the rash.  Lungs clear bilaterally to auscultation.

## 2018-04-13 NOTE — ED Notes (Signed)
Patient ambulated to bathroom with a steady gait.

## 2018-04-13 NOTE — Discharge Instructions (Addendum)
[facility] is listed on your discharge papers.  This clinic is free and may provide primary care for you as needed.  Continue your regular medication.  You may also continue taking Benadryl if needed for itching and usual cortisone cream.  The prednisone that was discussed was called to your pharmacy in [place].  Return to the emergency department if any severe worsening of your symptoms or urgent concerns.

## 2018-06-04 ENCOUNTER — Other Ambulatory Visit: Payer: Self-pay

## 2018-06-04 ENCOUNTER — Emergency Department
Admission: EM | Admit: 2018-06-04 | Discharge: 2018-06-04 | Disposition: A | Discharge status: Home / Self Care | Payer: Self-pay | Attending: Emergency Medicine | Admitting: Emergency Medicine

## 2018-06-04 DIAGNOSIS — F10920 Alcohol use, unspecified with intoxication, uncomplicated: Secondary | ICD-10-CM | POA: Insufficient documentation

## 2018-06-04 DIAGNOSIS — F1014 Alcohol abuse with alcohol-induced mood disorder: Secondary | ICD-10-CM | POA: Insufficient documentation

## 2018-06-04 DIAGNOSIS — Z915 Personal history of self-harm: Secondary | ICD-10-CM | POA: Insufficient documentation

## 2018-06-04 DIAGNOSIS — F3132 Bipolar disorder, current episode depressed, moderate: Secondary | ICD-10-CM | POA: Insufficient documentation

## 2018-06-04 DIAGNOSIS — Z590 Homelessness: Secondary | ICD-10-CM | POA: Insufficient documentation

## 2018-06-04 DIAGNOSIS — I10 Essential (primary) hypertension: Secondary | ICD-10-CM | POA: Insufficient documentation

## 2018-06-04 DIAGNOSIS — F17210 Nicotine dependence, cigarettes, uncomplicated: Secondary | ICD-10-CM | POA: Insufficient documentation

## 2018-06-04 DIAGNOSIS — Z79899 Other long term (current) drug therapy: Secondary | ICD-10-CM | POA: Insufficient documentation

## 2018-06-04 DIAGNOSIS — F331 Major depressive disorder, recurrent, moderate: Secondary | ICD-10-CM

## 2018-06-04 DIAGNOSIS — F1210 Cannabis abuse, uncomplicated: Secondary | ICD-10-CM | POA: Insufficient documentation

## 2018-06-04 LAB — URINALYSIS, COMPLETE (UACMP) WITH MICROSCOPIC
BILIRUBIN URINE: NEGATIVE
Bacteria, UA: NONE SEEN
Glucose, UA: NEGATIVE mg/dL
HGB URINE DIPSTICK: NEGATIVE
Ketones, ur: NEGATIVE mg/dL
Leukocytes,Ua: NEGATIVE
Nitrite: NEGATIVE
Protein, ur: NEGATIVE mg/dL
Specific Gravity, Urine: 1.005 (ref 1.005–1.030)
pH: 6 (ref 5.0–8.0)

## 2018-06-04 LAB — COMPREHENSIVE METABOLIC PANEL
ALT: 26 U/L (ref 0–44)
AST: 52 U/L — ABNORMAL HIGH (ref 15–41)
Albumin: 4.3 g/dL (ref 3.5–5.0)
Alkaline Phosphatase: 57 U/L (ref 38–126)
Anion gap: 13 (ref 5–15)
BUN: 10 mg/dL (ref 6–20)
CO2: 26 mmol/L (ref 22–32)
Calcium: 9.6 mg/dL (ref 8.9–10.3)
Chloride: 101 mmol/L (ref 98–111)
Creatinine, Ser: 0.9 mg/dL (ref 0.44–1.00)
GFR calc Af Amer: >60 mL/min (ref >60)
GFR calc non Af Amer: >60 mL/min (ref >60)
Glucose, Bld: 111 mg/dL — ABNORMAL HIGH (ref 70–99)
Potassium: 3.5 mmol/L (ref 3.5–5.1)
Sodium: 140 mmol/L (ref 135–145)
Total Bilirubin: 0.9 mg/dL (ref 0.3–1.2)
Total Protein: 7.8 g/dL (ref 6.5–8.1)

## 2018-06-04 LAB — URINE DRUG SCREEN, QUALITATIVE (ARMC ONLY)
Amphetamines, Ur Screen: NOT DETECTED
BARBITURATES, UR SCREEN: NOT DETECTED
Benzodiazepine, Ur Scrn: NOT DETECTED
Cannabinoid 50 Ng, Ur ~~LOC~~: POSITIVE — AB
Cocaine Metabolite,Ur ~~LOC~~: NOT DETECTED
MDMA (Ecstasy)Ur Screen: NOT DETECTED
Methadone Scn, Ur: NOT DETECTED
Opiate, Ur Screen: NOT DETECTED
Phencyclidine (PCP) Ur S: NOT DETECTED
Tricyclic, Ur Screen: NOT DETECTED

## 2018-06-04 LAB — CBC
HCT: 39.3 % (ref 36.0–46.0)
Hemoglobin: 13.5 g/dL (ref 12.0–15.0)
MCH: 32.8 pg (ref 26.0–34.0)
MCHC: 34.4 g/dL (ref 30.0–36.0)
MCV: 95.6 fL (ref 80.0–100.0)
Platelets: 216 10*3/uL (ref 150–400)
RBC: 4.11 MIL/uL (ref 3.87–5.11)
RDW: 14.4 % (ref 11.5–15.5)
WBC: 4.2 10*3/uL (ref 4.0–10.5)
nRBC: 0 % (ref 0.0–0.2)

## 2018-06-04 LAB — ETHANOL: ALCOHOL ETHYL (B): 251 mg/dL — AB (ref <10)

## 2018-06-04 LAB — PREGNANCY, URINE: Preg Test, Ur: NEGATIVE

## 2018-06-04 MED ORDER — HYDROXYZINE HCL 50 MG PO TABS: 50.0000 mg | ORAL_TABLET | Freq: Every day | ORAL | 0 refills | Status: DC | PRN

## 2018-06-04 MED ORDER — VITAMIN B-1 100 MG PO TABS
100.0000 mg | ORAL_TABLET | Freq: Every day | ORAL | Status: DC
  Administered 2018-06-04: 100 mg via ORAL
  Filled 2018-06-04: qty 1

## 2018-06-04 MED ORDER — GABAPENTIN 300 MG PO CAPS
300.0000 mg | ORAL_CAPSULE | Freq: Three times a day (TID) | ORAL | Status: DC
  Administered 2018-06-04 (×2): 300 mg via ORAL
  Filled 2018-06-04 (×2): qty 1

## 2018-06-04 MED ORDER — HYDROXYZINE HCL 25 MG PO TABS: 50.0000 mg | ORAL_TABLET | Freq: Every day | ORAL | Status: DC | PRN

## 2018-06-04 MED ORDER — CITALOPRAM HYDROBROMIDE 20 MG PO TABS
10.0000 mg | ORAL_TABLET | Freq: Every day | ORAL | Status: DC
  Administered 2018-06-04: 10 mg via ORAL
  Filled 2018-06-04: qty 1

## 2018-06-04 MED ORDER — CITALOPRAM HYDROBROMIDE 20 MG PO TABS: 20.0000 mg | ORAL_TABLET | Freq: Every day | ORAL | 0 refills | Status: DC

## 2018-06-04 MED ORDER — QUETIAPINE FUMARATE 100 MG PO TABS: 100.0000 mg | ORAL_TABLET | Freq: Every day | ORAL | 0 refills | Status: DC

## 2018-06-04 MED ORDER — THIAMINE HCL 100 MG/ML IJ SOLN: 100.0000 mg | Freq: Every day | INTRAMUSCULAR | Status: DC

## 2018-06-04 MED ORDER — QUETIAPINE FUMARATE 25 MG PO TABS: 100.0000 mg | ORAL_TABLET | Freq: Every day | ORAL | Status: DC

## 2018-06-04 NOTE — ED Triage Notes (Signed)
Pt here for suicidal thoughts without a plan. States she is homeless, has hx of the same.

## 2018-06-04 NOTE — ED Notes (Signed)
Patient pleasant with bright affect on admission.

## 2018-06-04 NOTE — ED Notes (Signed)
Hourly rounding reveals patient sleeping in room. No complaints, stable, in no acute distress. Q15 minute rounds and monitoring via Security to continue. 

## 2018-06-04 NOTE — ED Notes (Signed)
Psychiatrist talking with patient  

## 2018-06-04 NOTE — Discharge Instructions (Signed)
Alcohol Use Disorder  Alcohol use disorder is when your drinking disrupts your daily life. When you have this condition, you drink too much alcohol and you cannot control your drinking.  Alcohol use disorder can cause serious problems with your physical health. It can affect your brain, heart, liver, pancreas, immune system, stomach, and intestines. Alcohol use disorder can increase your risk for certain cancers and cause problems with your mental health, such as depression, anxiety, psychosis, delirium, and dementia. People with this disorder risk hurting themselves and others.  What are the causes?  This condition is caused by drinking too much alcohol over time. It is not caused by drinking too much alcohol only one or two times. Some people with this condition drink alcohol to cope with or escape from negative life events. Others drink to relieve pain or symptoms of mental illness.  What increases the risk?  You are more likely to develop this condition if:  · You have a family history of alcohol use disorder.  · Your culture encourages drinking to the point of intoxication, or makes alcohol easy to get.  · You had a mood or conduct disorder in childhood.  · You have been a victim of abuse.  · You are an adolescent and:  ? You have poor grades or difficulties in school.  ? Your caregivers do not talk to you about saying no to alcohol, or supervise your activities.  ? You are impulsive or you have trouble with self-control.  What are the signs or symptoms?  Symptoms of this condition include:  · Drinking more than you want to.  · Drinking for longer than you want to.  · Trying several times to drink less or to control your drinking.  · Spending a lot of time getting alcohol, drinking, or recovering from drinking.  · Craving alcohol.  · Having problems at work, at school, or at home due to drinking.  · Having problems in relationships due to drinking.  · Drinking when it is dangerous to drink, such as before  driving a car.  · Continuing to drink even though you know you might have a physical or mental problem related to drinking.  · Needing more and more alcohol to get the same effect you want from the alcohol (building up tolerance).  · Having symptoms of withdrawal when you stop drinking. Symptoms of withdrawal include:  ? Fatigue.  ? Nightmares.  ? Trouble sleeping.  ? Depression.  ? Anxiety.  ? Fever.  ? Seizures.  ? Severe confusion.  ? Feeling or seeing things that are not there (hallucinations).  ? Tremors.  ? Rapid heart rate.  ? Rapid breathing.  ? High blood pressure.  · Drinking to avoid symptoms of withdrawal.  How is this diagnosed?  This condition is diagnosed with an assessment. Your health care provider may start the assessment by asking three or four questions about your drinking.  Your health care provider may perform a physical exam or do lab tests to see if you have physical problems resulting from alcohol use. She or he may refer you to a mental health professional for evaluation.  How is this treated?  Some people with alcohol use disorder are able to reduce their alcohol use to low-risk levels. Others need to completely quit drinking alcohol. When necessary, mental health professionals with specialized training in substance use treatment can help. Your health care provider can help you decide how severe your alcohol use disorder is and what type   of treatment you need. The following forms of treatment are available:  · Detoxification. Detoxification involves quitting drinking and using prescription medicines within the first week to help lessen withdrawal symptoms. This treatment is important for people who have had withdrawal symptoms before and for heavy drinkers who are likely to have withdrawal symptoms. Alcohol withdrawal can be dangerous, and in severe cases, it can cause death. Detoxification may be provided in a home, community, or primary care setting, or in a hospital or substance use  treatment facility.  · Counseling. This treatment is also called talk therapy. It is provided by substance use treatment counselors. A counselor can address the reasons you use alcohol and suggest ways to keep you from drinking again or to prevent problem drinking. The goals of talk therapy are to:  ? Find healthy activities and ways for you to cope with stress.  ? Identify and avoid the things that trigger your alcohol use.  ? Help you learn how to handle cravings.  · Medicines. Medicines can help treat alcohol use disorder by:  ? Decreasing alcohol cravings.  ? Decreasing the positive feeling you have when you drink alcohol.  ? Causing an uncomfortable physical reaction when you drink alcohol (aversion therapy).  · Support groups. Support groups are led by people who have quit drinking. They provide emotional support, advice, and guidance.  These forms of treatment are often combined. Some people with this condition benefit from a combination of treatments provided by specialized substance use treatment centers.  Follow these instructions at home:  · Take over-the-counter and prescription medicines only as told by your health care provider.  · Check with your health care provider before starting any new medicines.  · Ask friends and family members not to offer you alcohol.  · Avoid situations where alcohol is served, including gatherings where others are drinking alcohol.  · Create a plan for what to do when you are tempted to use alcohol.  · Find hobbies or activities that you enjoy that do not include alcohol.  · Keep all follow-up visits as told by your health care provider. This is important.  How is this prevented?  · If you drink, limit alcohol intake to no more than 1 drink a day for nonpregnant women and 2 drinks a day for men. One drink equals 12 oz of beer, 5 oz of wine, or 1½ oz of hard liquor.  · If you have a mental health condition, get treatment and support.  · Do not give alcohol to  adolescents.  · If you are an adolescent:  ? Do not drink alcohol.  ? Do not be afraid to say no if someone offers you alcohol. Speak up about why you do not want to drink. You can be a positive role model for your friends and set a good example for those around you by not drinking alcohol.  ? If your friends drink, spend time with others who do not drink alcohol. Make new friends who do not use alcohol.  ? Find healthy ways to manage stress and emotions, such as meditation or deep breathing, exercise, spending time in nature, listening to music, or talking with a trusted friend or family member.  Contact a health care provider if:  · You are not able to take your medicines as told.  · Your symptoms get worse.  · You return to drinking alcohol (relapse) and your symptoms get worse.  Get help right away if:  · You have thoughts   about hurting yourself or others.  If you ever feel like you may hurt yourself or others, or have thoughts about taking your own life, get help right away. You can go to your nearest emergency department or call:  · Your local emergency services (911 in the [place]  · A suicide crisis helpline, such as the National Suicide Prevention Lifeline at 1-800-273-8255. This is open 24 hours a day.  Summary  · Alcohol use disorder is when your drinking disrupts your daily life. When you have this condition, you drink too much alcohol and you cannot control your drinking.  · Treatment may include detoxification, counseling, medicine, and support groups.  · Ask friends and family members not to offer you alcohol. Avoid situations where alcohol is served.  · Get help right away if you have thoughts about hurting yourself or others.  This information is not intended to replace advice given to you by your health care provider. Make sure you discuss any questions you have with your health care provider.  Document Released: 04/03/2004 Document Revised: 11/22/2015 Document Reviewed: 11/22/2015  Elsevier  Interactive Patient Education © 2019 [employer]

## 2018-06-04 NOTE — ED Notes (Signed)
Patient discharged home, patient received discharge papers and medications. Patient received  belongings and verbalized she has received all of her belongings. Patient appropriate and cooperative, Denies SI/HI AVH. Vital signs taken. NAD noted.

## 2018-06-04 NOTE — BH Assessment (Signed)
Assessment Note  Marie Mckenzie is an 28 y.o. female who presents to the ER due to voicing thoughts of ending her life. With this Clinical research associate, patient denied SI/HI and AV/H. She states, she was having thoughts of feeling worthless and sad. She states she have no desire to hurt herself or anyone else. "I'm a Christian, so I won't kill myself." Patient also reports, she's upset because she found out her ex-husband "got caught watching kiddie porn." She further explains, she and the ex-husband have no contact, because she got a "50b put in place." This was done prior to her finding out about his recent charge with pornography.  During the interview, the patient was calm, cooperative and pleasant. She was able to provide appropriate answers to the questions. She admits to alcohol use and states, it has "slowed down a lot." Upon arrival to the ER, her BAC was 251. Patient is known in the ER for similar presentation.  Diagnosis: Depression  Past Medical History:  Past Medical History:  Diagnosis Date  . ADHD (attention deficit hyperactivity disorder)   . Anxiety   . Hypertension   . Major depressive disorder     Past Surgical History:  Procedure Laterality Date  . TONSILLECTOMY    . WISDOM TOOTH EXTRACTION      Family History:  Family History  Problem Relation Age of Onset  . Hypertension Father     Social History:  reports that she has been smoking cigarettes. She has been smoking about 1.00 pack per day. She has never used smokeless tobacco. She reports current alcohol use of about 7.0 standard drinks of alcohol per week. She reports that she does not use drugs.  Additional Social History:  Alcohol / Drug Use Pain Medications: See PTA Prescriptions: See PTA Over the Counter: See PTA Longest period of sobriety (when/how long): Unable to quantify Negative Consequences of Use: Financial, Personal relationships, Work / School  CIWA: CIWA-Ar BP: 127/87 Pulse Rate: (!) 118 COWS:     Allergies: No Known Allergies  Home Medications: (Not in a hospital admission)   OB/GYN Status:  Patient's last menstrual period was 06/04/2018.  General Assessment Data Location of Assessment: [facility] ED TTS Assessment: In system Is this a Tele or Face-to-Face Assessment?: Face-to-Face Is this an Initial Assessment or a Re-assessment for this encounter?: Initial Assessment Language Other than English: No Living Arrangements: Homeless/Shelter What gender do you identify as?: Female Marital status: Divorced Pregnancy Status: No Living Arrangements: Other (Comment)(Homeless) Can pt return to current living arrangement?: Yes Admission Status: Voluntary Is patient capable of signing voluntary admission?: Yes Referral Source: Self/Family/Friend Insurance type: None  Medical Screening Exam [facility] Walk-in ONLY) Medical Exam completed: Yes  Crisis Care Plan Living Arrangements: Other (Comment)(Homeless) Legal Guardian: Other:(Self) Name of Psychiatrist: [facility]) Name of Therapist: [facility]  Education Status Is patient currently in school?: No  Risk to self with the past 6 months Suicidal Ideation: No Has patient been a risk to self within the past 6 months prior to admission? : No Suicidal Intent: No Has patient had any suicidal intent within the past 6 months prior to admission? : No Is patient at risk for suicide?: No Suicidal Plan?: No Has patient had any suicidal plan within the past 6 months prior to admission? : No Access to Means: No What has been your use of drugs/alcohol within the last 12 months?: Alcohol Previous Attempts/Gestures: No How many times?: 0 Other Self Harm Risks: Active alcohol use  Triggers for Past Attempts: None known Intentional Self Injurious Behavior: None Family Suicide History: Unknown Recent stressful life event(s): Other (Comment)(Currently homeless) Persecutory voices/beliefs?: No Depression:  Yes Depression Symptoms: Feeling worthless/self pity Substance abuse history and/or treatment for substance abuse?: Yes Suicide prevention information given to non-admitted patients: Not applicable  Risk to Others within the past 6 months Homicidal Ideation: No Does patient have any lifetime risk of violence toward others beyond the six months prior to admission? : No Thoughts of Harm to Others: No Current Homicidal Intent: No Current Homicidal Plan: No Access to Homicidal Means: No Identified Victim: Reports fo none History of harm to others?: No Assessment of Violence: None Noted Violent Behavior Description: Reports of none Does patient have access to weapons?: No Criminal Charges Pending?: No Does patient have a court date: No Is patient on probation?: No  Psychosis Hallucinations: None noted Delusions: None noted  Mental Status Report Appearance/Hygiene: Unremarkable, In scrubs Eye Contact: Fair Motor Activity: Freedom of movement, Unremarkable Speech: Logical/coherent, Unremarkable Level of Consciousness: Alert Mood: Pleasant Affect: Appropriate to circumstance, Sad Anxiety Level: Minimal Thought Processes: Coherent, Relevant Judgement: Partial Orientation: Person, Place, Time, Situation, Appropriate for developmental age Obsessive Compulsive Thoughts/Behaviors: Minimal  Cognitive Functioning Concentration: Normal Memory: Recent Intact, Remote Intact Is patient IDD: No Insight: Fair Impulse Control: Fair Appetite: Good Have you had any weight changes? : No Change Sleep: No Change Total Hours of Sleep: 8 Vegetative Symptoms: None  ADLScreening [facility]) Patient's cognitive ability adequate to safely complete daily activities?: Yes Patient able to express need for assistance with ADLs?: Yes Independently performs ADLs?: Yes (appropriate for developmental age)  Prior Inpatient Therapy Prior Inpatient Therapy: Yes Prior Therapy Dates:  02/2018, 11/2017 & 02/2016,  Prior Therapy Facilty/Provider(s): [facility] Reason for Treatment: Depression  Prior Outpatient Therapy Prior Outpatient Therapy: Yes Prior Therapy Dates: Current Prior Therapy Facilty/Provider(s): [facility] Reason for Treatment: Depression Does patient have an ACCT team?: Yes Does patient have Intensive In-House Services?  : No Does patient have [facility] services? : No Does patient have P4CC services?: No  ADL Screening (condition at time of admission) Patient's cognitive ability adequate to safely complete daily activities?: Yes Is the patient deaf or have difficulty hearing?: No Does the patient have difficulty seeing, even when wearing glasses/contacts?: No Does the patient have difficulty concentrating, remembering, or making decisions?: No Patient able to express need for assistance with ADLs?: Yes Does the patient have difficulty dressing or bathing?: No Independently performs ADLs?: Yes (appropriate for developmental age) Does the patient have difficulty walking or climbing stairs?: No Weakness of Legs: None Weakness of Arms/Hands: None  Home Assistive Devices/Equipment Home Assistive Devices/Equipment: None  Therapy Consults (therapy consults require a physician order) PT Evaluation Needed: No OT Evalulation Needed: No SLP Evaluation Needed: No Abuse/Neglect Assessment (Assessment to be complete while patient is alone) Abuse/Neglect Assessment Can Be Completed: Yes Physical Abuse: Denies Verbal Abuse: Denies Sexual Abuse: Denies Exploitation of patient/patient's resources: Denies Self-Neglect: Denies Values / Beliefs Cultural Requests During Hospitalization: None Spiritual Requests During Hospitalization: None Consults Spiritual Care Consult Needed: No Social Work Consult Needed: No         Child/Adolescent Assessment Running Away Risk: Denies(Patient is an adult)  Disposition:  Disposition Initial Assessment  Completed for this Encounter: Yes  On Site Evaluation by:   Reviewed with Physician:    Lilyan Gilford MS, LCAS, LPMHCA, NCC, CCSI Therapeutic Triage Specialist 06/04/2018 11:33 AM

## 2018-06-04 NOTE — TOC Transition Note (Signed)
Transition of Care [facility]) - CM/SW Discharge Note   Patient Details  Name: Marie Mckenzie MRN: [id number] Date of Birth: 09-17-1990  Transition of Care [facility]) CM/SW Contact:  Allayne Butcher, RN Phone Number: 06/04/2018, 3:33 PM   Clinical Narrative:    Medications for discharge faxed to Medication Management, picked up by hospital staff member and delivered to patient at the bedside.  RNCM also provided patient with 1 Link bus pass and schedule information and a booklet for low cost and free healthcare options in [place].     Final next level of care: (Homeless) Barriers to Discharge: No Barriers Identified   Patient Goals and CMS Choice        Discharge Placement                       Discharge Plan and Services   Discharge Planning Services: CM Consult, Medication Assistance                      Social Determinants of Health (SDOH) Interventions     Readmission Risk Interventions No flowsheet data found.

## 2018-06-04 NOTE — ED Provider Notes (Signed)
[facility] Emergency Department Provider Note   ____________________________________________   First MD Initiated Contact with Patient 06/04/18 0507     (approximate)  I have reviewed the triage vital signs and the nursing notes.   HISTORY  Chief Complaint Suicidal    HPI Felissa Blouch is a 28 y.o. female who presents to the ED from the streets with a chief complaint of depression with suicidal thoughts without plan.  Patient has a history of bipolar disorder who is currently homeless.  Denies active HI/AH/VH.  Voices no medical complaints.       Past Medical History:  Diagnosis Date  . ADHD (attention deficit hyperactivity disorder)   . Anxiety   . Hypertension   . Major depressive disorder     Patient Active Problem List   Diagnosis Date Noted  . Bipolar 1 disorder, manic, moderate (HCC) 02/11/2018  . Bipolar 1 disorder, manic, mild (HCC) 02/10/2018  . 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 Self-inflicted laceration of wrist 12/04/2017  . Severe recurrent major depression without psychotic features (HCC) 12/04/2017  . Hypokalemia 07/25/2016  . Elevated transaminase level 07/25/2016  . Leukopenia 07/25/2016  . Thrombocytopenia (HCC) 07/25/2016  . 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 Alcohol withdrawal (HCC) 07/24/2016  . Attention deficit hyperactivity disorder (ADHD) 02/11/2016  . Major depressive disorder, recurrent severe without psychotic features (HCC) 02/10/2016  . Suicidal ideation 02/10/2016  . Involuntary commitment 02/10/2016  . Alcohol use disorder, severe, dependence (HCC) 02/10/2016  . Alcohol intoxication (HCC) 02/10/2016  . Unspecified mood (affective) disorder (HCC) 02/10/2016  . Tobacco use disorder 02/10/2016    Past Surgical History:  Procedure Laterality Date  . TONSILLECTOMY    . WISDOM TOOTH EXTRACTION      Prior to Admission medications   Medication Sig Start  Date End Date Taking? Authorizing Provider  citalopram (CELEXA) 20 MG tablet Take 1 tablet (20 mg total) by mouth daily. 02/15/18   Hessie Knows, MD  folic acid (FOLVITE) 1 MG tablet Take 1 tablet (1 mg total) by mouth daily. 02/15/18   Hessie Knows, MD  hydrOXYzine (ATARAX/VISTARIL) 50 MG tablet Take 1 tablet (50 mg total) by mouth daily as needed for anxiety. 02/15/18   Hessie Knows, MD  predniSONE (DELTASONE) 10 MG tablet Take 6 tablets  today, on day 2 take 5 tablets, day 3 take 4 tablets, day 4 take 3 tablets, day 5 take  2 tablets and 1 tablet the last day 04/13/18   Tommi Rumps, PA-C  QUEtiapine (SEROQUEL) 100 MG tablet Take 1 tablet (100 mg total) by mouth at bedtime. 12/08/17   McNew, Ileene Hutchinson, MD  thiamine 100 MG tablet Take 1 tablet (100 mg total) by mouth daily. 02/15/18   Hessie Knows, MD    Allergies Patient has no known allergies.  Family History  Problem Relation Age of Onset  . Hypertension Father     Social History Social History   Tobacco Use  . Smoking status: Current Every Day Smoker    Packs/day: 1.00    Types: Cigarettes  . Smokeless tobacco: Never Used  Substance Use Topics  . Alcohol use: Yes    Alcohol/week: 7.0 standard drinks    Types: 7 Glasses of wine per week    Comment: Hx ETOH  . Drug use: No    Review of Systems  Constitutional: No fever/chills Eyes: No visual changes. ENT: No sore throat. Cardiovascular: Denies chest pain. Respiratory: Denies shortness of breath.  Gastrointestinal: No abdominal pain.  No nausea, no vomiting.  No diarrhea.  No constipation. Genitourinary: Negative for dysuria. Musculoskeletal: Negative for back pain. Skin: Negative for rash. Neurological: Negative for headaches, focal weakness or numbness. Psychiatric: Positive for depression with suicidal thoughts without plan.  ____________________________________________   PHYSICAL EXAM:  VITAL SIGNS: ED Triage Vitals [06/04/18 0441]  Enc Vitals Group      BP 127/87     Pulse Rate (!) 118     Resp 20     Temp 98.3 F (36.8 C)     Temp Source Oral     SpO2 95 %     Weight 190 lb (86.2 kg)     Height      Head Circumference      Peak Flow      Pain Score 0     Pain Loc      Pain Edu?      Excl. in GC?     Constitutional: Alert and oriented. Well appearing and in no acute distress. Eyes: Conjunctivae are normal. PERRL. EOMI. Head: Atraumatic. Nose: No congestion/rhinnorhea. Mouth/Throat: Mucous membranes are moist.  Oropharynx non-erythematous. Neck: No stridor.   Cardiovascular: Normal rate, regular rhythm. Grossly normal heart sounds.  Good peripheral circulation. Respiratory: Normal respiratory effort.  No retractions. Lungs CTAB. Gastrointestinal: Soft and nontender. No distention. No abdominal bruits. No CVA tenderness. Musculoskeletal: No lower extremity tenderness nor edema.  No joint effusions. Neurologic:  Normal speech and language. No gross focal neurologic deficits are appreciated. No gait instability. Skin:  Skin is warm, dry and intact. No rash noted. Psychiatric: Mood and affect are normal. Speech and behavior are normal.  ____________________________________________   LABS (all labs ordered are listed, but only abnormal results are displayed)  Labs Reviewed  COMPREHENSIVE METABOLIC PANEL - Abnormal; Notable for the following components:      Result Value   Glucose, Bld 111 (*)    AST 52 (*)    All other components within normal limits  ETHANOL - Abnormal; Notable for the following components:   Alcohol, Ethyl (B) 251 (*)    All other components within normal limits  URINALYSIS, COMPLETE (UACMP) WITH MICROSCOPIC - Abnormal; Notable for the following components:   Color, Urine STRAW (*)    APPearance CLEAR (*)    All other components within normal limits  URINE DRUG SCREEN, QUALITATIVE ([facility] ONLY) - Abnormal; Notable for the following components:   Cannabinoid 50 Ng, Ur [place] POSITIVE (*)    All other  components within normal limits  CBC  PREGNANCY, URINE  POC URINE PREG, ED   ____________________________________________  EKG  None ____________________________________________  RADIOLOGY  ED MD interpretation: None  Official radiology report(s): No results found.  ____________________________________________   PROCEDURES  Procedure(s) performed (including Critical Care):  Procedures   ____________________________________________   INITIAL IMPRESSION / ASSESSMENT AND PLAN / ED COURSE  As part of my medical decision making, I reviewed the following data within the electronic MEDICAL RECORD NUMBER Nursing notes reviewed and incorporated, Labs reviewed, A consult was requested and obtained from this/these consultant(s) Psychiatry and Notes from prior ED visits        28 year old female with bipolar disorder, currently homeless, who presents with depression with suicidal ideation without plan.  Contracts for safety in the emergency department.  Will keep patient under voluntary status pending psychiatric evaluation and disposition.     ____________________________________________   FINAL CLINICAL IMPRESSION(S) / ED DIAGNOSES  Final diagnoses:  Moderate episode of  recurrent major depressive disorder (HCC)  Bipolar affective disorder, currently depressed, moderate (HCC)  Alcoholic intoxication without complication (HCC)  Marijuana abuse     ED Discharge Orders    None       Note:  This document was prepared using Dragon voice recognition software and may include unintentional dictation errors.   Irean Hong, MD 06/05/18 [phone]

## 2018-06-04 NOTE — Consult Note (Signed)
[facility] Face-to-Face Psychiatry Consult   Reason for Consult:  Suicidal ideation Referring Physician:  ER MD Patient Identification: Marie Mckenzie MRN:  [id number] Principal Diagnosis: Alcohol abuse with alcohol-induced mood disorder (HCC) Diagnosis:  Principal Problem:   Alcohol abuse with alcohol-induced mood disorder (HCC)   Total Time spent with patient: 30 minutes  Subjective:  "I needed a night to think through stuff.  I am out of medication and knew if I came here I could get them"  HPI: Marie Mckenzie is a 28 y.o. female patient admitted to the ED after drinking alcohol and having "bad thoughts."  On exam today:  Patient was calm and cooperative.  She was alert and oriented x 3. Patient denies having suicidal or homicidal ideations, no plan or intent. No withdrawal symptoms from her recent alcohol use. Patient reports she is been feeling bad because she is homeless and has not had her medication in awhile. She needed a night to regroup and stated "she knew if she came here she could get her medications."  No longer has the "bad thoughts."  Her depression increases with alcohol use and decreases with less stress.  Low level of anxiety, 2/10, due to stressors.  [facility]  [facility] see a provider at [facility] and has an appointment in June.  Patient says has transportation to get there. She does have prescription for her medication but does not have the money to obtain them.  She currently takes Celexa for depression, Vistaril for anxiety, and Seroquel for sleep.  Pt declines needing help for alcohol abuse and is ready for discharge. Rx provided by Care Management, encouraged to go to the shelter in [place] as they opened extra to accommodate for the COVID-19 to space people.  Past Psychiatric History: Anxiety, depression, ADHD and MDD  Risk to Self: Suicidal Ideation: No Suicidal Intent: No Is patient at risk for suicide?:  No Suicidal Plan?: No Access to Means: No What has been your use of drugs/alcohol within the last 12 months?: Alcohol How many times?: 0 Other Self Harm Risks: Active alcohol use Triggers for Past Attempts: None known Intentional Self Injurious Behavior: None Risk to Others: Homicidal Ideation: No Thoughts of Harm to Others: No Current Homicidal Intent: No Current Homicidal Plan: No Access to Homicidal Means: No Identified Victim: Reports fo none History of harm to others?: No Assessment of Violence: None Noted Violent Behavior Description: Reports of none Does patient have access to weapons?: No Criminal Charges Pending?: No Does patient have a court date: No Prior Inpatient Therapy: Prior Inpatient Therapy: Yes Prior Therapy Dates: 02/2018, 11/2017 & 02/2016,  Prior Therapy Facilty/Provider(s): [facility] Reason for Treatment: Depression Prior Outpatient Therapy: Prior Outpatient Therapy: Yes Prior Therapy Dates: Current Prior Therapy Facilty/Provider(s): [facility] Reason for Treatment: Depression Does patient have an ACCT team?: Yes Does patient have Intensive In-House Services?  : No Does patient have [facility] services? : No Does patient have P4CC services?: No  Past Medical History:  Past Medical History:  Diagnosis Date  . ADHD (attention deficit hyperactivity disorder)   . Anxiety   . Hypertension   . Major depressive disorder     Past Surgical History:  Procedure Laterality Date  . TONSILLECTOMY    . WISDOM TOOTH EXTRACTION     Family History:  Family History  Problem Relation Age of Onset  . Hypertension Father    Family Psychiatric  History: unknown Social History:  Social  History   Substance and Sexual Activity  Alcohol Use Yes  . Alcohol/week: 7.0 standard drinks  . Types: 7 Glasses of wine per week   Comment: Hx ETOH     Social History   Substance and Sexual Activity  Drug Use No    Social History   Socioeconomic History  .  Marital status: Single    Spouse name: Not on file  . Number of children: Not on file  . Years of education: Not on file  . Highest education level: Not on file  Occupational History  . Not on file  Social Needs  . Financial resource strain: Not on file  . Food insecurity:    Worry: Not on file    Inability: Not on file  . Transportation needs:    Medical: Not on file    Non-medical: Not on file  Tobacco Use  . Smoking status: Current Every Day Smoker    Packs/day: 1.00    Types: Cigarettes  . Smokeless tobacco: Never Used  Substance and Sexual Activity  . Alcohol use: Yes    Alcohol/week: 7.0 standard drinks    Types: 7 Glasses of wine per week    Comment: Hx ETOH  . Drug use: No  . Sexual activity: Yes    Birth control/protection: Condom  Lifestyle  . Physical activity:    Days per week: Not on file    Minutes per session: Not on file  . Stress: Not on file  Relationships  . Social connections:    Talks on phone: Not on file    Gets together: Not on file    Attends religious service: Not on file    Active member of club or organization: Not on file    Attends meetings of clubs or organizations: Not on file    Relationship status: Not on file  Other Topics Concern  . Not on file  Social History Narrative  . Not on file   Additional Social History:  No  Allergies:  No Known Allergies  Labs:  Results for orders placed or performed during the hospital encounter of 06/04/18 (from the past 48 hour(s))  CBC     Status: None   Collection Time: 06/04/18  4:43 AM  Result Value Ref Range   WBC 4.2 4.0 - 10.5 K/uL   RBC 4.11 3.87 - 5.11 MIL/uL   Hemoglobin 13.5 12.0 - 15.0 g/dL   HCT 68.0 32.1 - 22.4 %   MCV 95.6 80.0 - 100.0 fL   MCH 32.8 26.0 - 34.0 pg   MCHC 34.4 30.0 - 36.0 g/dL   RDW 82.5 00.3 - 70.4 %   Platelets 216 150 - 400 K/uL   nRBC 0.0 0.0 - 0.2 %    Comment: Performed at [facility], [address] [place], [place] [address]   Comprehensive metabolic panel     Status: Abnormal   Collection Time: 06/04/18  4:43 AM  Result Value Ref Range   Sodium 140 135 - 145 mmol/L   Potassium 3.5 3.5 - 5.1 mmol/L   Chloride 101 98 - 111 mmol/L   CO2 26 22 - 32 mmol/L   Glucose, Bld 111 (H) 70 - 99 mg/dL   BUN 10 6 - 20 mg/dL   Creatinine, Ser 6.94 0.44 - 1.00 mg/dL   Calcium 9.6 8.9 - 50.3 mg/dL   Total Protein 7.8 6.5 - 8.1 g/dL   Albumin 4.3 3.5 - 5.0 g/dL   AST 52 (H)  15 - 41 U/L   ALT 26 0 - 44 U/L   Alkaline Phosphatase 57 38 - 126 U/L   Total Bilirubin 0.9 0.3 - 1.2 mg/dL   GFR calc non Af Amer >60 >60 mL/min   GFR calc Af Amer >60 >60 mL/min   Anion gap 13 5 - 15    Comment: Performed at [facility], [address] [place], [place] [address]  Ethanol     Status: Abnormal   Collection Time: 06/04/18  4:43 AM  Result Value Ref Range   Alcohol, Ethyl (B) 251 (H) <10 mg/dL    Comment: (NOTE) Lowest detectable limit for serum alcohol is 10 mg/dL. For medical purposes only. Performed at [facility], [address] [place], [place] [address]   Urinalysis, Complete w Microscopic     Status: Abnormal   Collection Time: 06/04/18  4:43 AM  Result Value Ref Range   Color, Urine STRAW (A) YELLOW   APPearance CLEAR (A) CLEAR   Specific Gravity, Urine 1.005 1.005 - 1.030   pH 6.0 5.0 - 8.0   Glucose, UA NEGATIVE NEGATIVE mg/dL   Hgb urine dipstick NEGATIVE NEGATIVE   Bilirubin Urine NEGATIVE NEGATIVE   Ketones, ur NEGATIVE NEGATIVE mg/dL   Protein, ur NEGATIVE NEGATIVE mg/dL   Nitrite NEGATIVE NEGATIVE   Leukocytes,Ua NEGATIVE NEGATIVE   RBC / HPF 0-5 0 - 5 RBC/hpf   WBC, UA 0-5 0 - 5 WBC/hpf   Bacteria, UA NONE SEEN NONE SEEN   Squamous Epithelial / LPF 0-5 0 - 5    Comment: Performed at [facility], [address] [place], [place] [address]  Urine Drug Screen, Qualitative ([facility] only)     Status: Abnormal   Collection Time: 06/04/18  4:43 AM  Result Value Ref Range    Tricyclic, Ur Screen NONE DETECTED NONE DETECTED   Amphetamines, Ur Screen NONE DETECTED NONE DETECTED   MDMA (Ecstasy)Ur Screen NONE DETECTED NONE DETECTED   Cocaine Metabolite,Ur [place] NONE DETECTED NONE DETECTED   Opiate, Ur Screen NONE DETECTED NONE DETECTED   Phencyclidine (PCP) Ur S NONE DETECTED NONE DETECTED   Cannabinoid 50 Ng, Ur [place] POSITIVE (A) NONE DETECTED   Barbiturates, Ur Screen NONE DETECTED NONE DETECTED   Benzodiazepine, Ur Scrn NONE DETECTED NONE DETECTED   Methadone Scn, Ur NONE DETECTED NONE DETECTED    Comment: (NOTE) Tricyclics + metabolites, urine    Cutoff 1000 ng/mL Amphetamines + metabolites, urine  Cutoff 1000 ng/mL MDMA (Ecstasy), urine              Cutoff 500 ng/mL Cocaine Metabolite, urine          Cutoff 300 ng/mL Opiate + metabolites, urine        Cutoff 300 ng/mL Phencyclidine (PCP), urine         Cutoff 25 ng/mL Cannabinoid, urine                 Cutoff 50 ng/mL Barbiturates + metabolites, urine  Cutoff 200 ng/mL Benzodiazepine, urine              Cutoff 200 ng/mL Methadone, urine                   Cutoff 300 ng/mL The urine drug screen provides only a preliminary, unconfirmed analytical test result and should not be used for non-medical purposes. Clinical consideration and professional judgment should be applied to any positive drug screen result due to possible interfering substances. A more  specific alternate chemical method must be used in order to obtain a confirmed analytical result. Gas chromatography / mass spectrometry (GC/MS) is the preferred confirmat ory method. Performed at [facility], [address] [place], [place] [address]   Pregnancy, urine     Status: None   Collection Time: 06/04/18  4:43 AM  Result Value Ref Range   Preg Test, Ur NEGATIVE NEGATIVE    Comment: Performed at [facility], [address] [place], [place] [address]    Current Facility-Administered Medications  Medication Dose Route  Frequency Provider Last Rate Last Dose  . citalopram (CELEXA) tablet 10 mg  10 mg Oral Daily Charm RingsLord, Archer Vise Y, NP   10 mg at 06/04/18 [id number]  . gabapentin (NEURONTIN) capsule 300 mg  300 mg Oral TID Charm RingsLord, Savian Mazon Y, NP   300 mg at 06/04/18 [id number]  . hydrOXYzine (ATARAX/VISTARIL) tablet 50 mg  50 mg Oral Daily PRN Charm RingsLord, Anshi Jalloh Y, NP      . QUEtiapine (SEROQUEL) tablet 100 mg  100 mg Oral QHS Charm RingsLord, Nancyann Cotterman Y, NP      . thiamine (VITAMIN B-1) tablet 100 mg  100 mg Oral Daily Charm RingsLord, Marcelles Clinard Y, NP   100 mg at 06/04/18 [id number]   Or  . thiamine (B-1) injection 100 mg  100 mg Intravenous Daily Charm RingsLord, Joia Doyle Y, NP       Current Outpatient Medications  Medication Sig Dispense Refill  . citalopram (CELEXA) 20 MG tablet Take 1 tablet (20 mg total) by mouth daily. 30 tablet   . folic acid (FOLVITE) 1 MG tablet Take 1 tablet (1 mg total) by mouth daily. 30 tablet 1  . hydrOXYzine (ATARAX/VISTARIL) 50 MG tablet Take 1 tablet (50 mg total) by mouth daily as needed for anxiety. 30 tablet 0  . predniSONE (DELTASONE) 10 MG tablet Take 6 tablets  today, on day 2 take 5 tablets, day 3 take 4 tablets, day 4 take 3 tablets, day 5 take  2 tablets and 1 tablet the last day 21 tablet 0  . QUEtiapine (SEROQUEL) 100 MG tablet Take 1 tablet (100 mg total) by mouth at bedtime. 30 tablet 0  . thiamine 100 MG tablet Take 1 tablet (100 mg total) by mouth daily. 30 tablet 1    Musculoskeletal: Strength & Muscle Tone: within normal limits Gait & Station: normal Patient leans: Right  Psychiatric Specialty Exam: Physical Exam  Nursing note and vitals reviewed. Constitutional: She is oriented to person, place, and time. She appears well-developed and well-nourished.  HENT:  Head: Normocephalic.  Neck: Normal range of motion.  Cardiovascular: Normal rate and regular rhythm.  Respiratory: Effort normal.  Musculoskeletal: Normal range of motion.  Neurological: She is alert and oriented to person, place, and time.  Psychiatric:  Her speech is normal and behavior is normal. Judgment and thought content normal. Her mood appears anxious. Cognition and memory are normal.    Review of Systems  Psychiatric/Behavioral: Positive for substance abuse. The patient is nervous/anxious.   All other systems reviewed and are negative.   Blood pressure 127/87, pulse (!) 118, temperature 98.3 F (36.8 C), temperature source Oral, resp. rate 20, weight 86.2 kg, last menstrual period 06/04/2018, SpO2 95 %.Body mass index is 30.67 kg/m.  General Appearance: Casual  Eye Contact:  Good  Speech:  Clear and Coherent and Normal Rate  Volume:  Normal  Mood:  Anxiety  Affect:  Appropriate and Congruent  Thought Process:  Coherent, Goal Directed and Linear  Orientation:  Full (Time, Place, and Person)  Thought Content:  Logical  Suicidal Thoughts:  No  Homicidal Thoughts:  No  Memory:  Immediate;   Good Recent;   Good Remote;   Good  Judgement:  Fair  Insight:  Fair  Psychomotor Activity:  Normal  Concentration:  Concentration: Good and Attention Span: Good  Recall:  Good  Fund of Knowledge:  Fair  Language:  Good  Akathisia:  Negative  Handed:  Right  AIMS (if indicated):     Assets:  Communication Skills Desire for Improvement Physical Health Resilience Transportation  ADL's:  Intact  Cognition:  WNL  Sleep:        Treatment Plan Summary: Alcohol abuse with alcohol-induced mood disorder (HCC) Medication management: Restarted Celexa 10 mg PO, 20 mg to start in three days Restarted Seroquel 100 mg at bedtime for mood and sleep -CIWA while in the ED, no coverage needed -Gabapentin 300 mg TID for withdrawal symptoms in ED -Care management consult for medications  Anxiety: Restarted Vistaril 50 mg PO PRN anxiety  Pt will follow up with outpatient provider in June. Information provided for indigent resources  Disposition: No evidence of imminent risk to self or others at present.   Discussed crisis plan, support  from social network, calling 911, coming to the Emergency Department, and calling Suicide Hotline.  Nanine Means, NP 06/04/2018 1:43 PM

## 2018-06-04 NOTE — TOC Initial Note (Signed)
Transition of Care [facility]) - Initial/Assessment Note    Patient Details  Name: Marie Mckenzie MRN: [id number] Date of Birth: Jul 08, 1990  [facility] of [facility]) CM/SW Contact:    Allayne Butcher, RN Phone Number: 06/04/2018, 1:58 PM  Clinical Narrative:                 Patient needs help obtaining prescription medications she is homeless and lives in [facility].   RNCM will fax prescriptions to Medication Management and have them delivered to bedside.  Expected Discharge Plan: (Homeless- [facility])     Patient Goals and CMS Choice        Expected Discharge Plan and Services Expected Discharge Plan: (Homeless- [place])   Discharge Planning Services: CM Consult, Medication Assistance   Living arrangements for the past 2 months: Homeless Expected Discharge Date: 06/04/18                        Prior Living Arrangements/Services Living arrangements for the past 2 months: Homeless   Patient language and need for interpreter reviewed:: Yes        Need for Family Participation in Patient Care: No (Comment) Care giver support system in place?: No (comment)(pt homeless)   Criminal Activity/Legal Involvement Pertinent to Current Situation/Hospitalization: No - Comment as needed  Activities of Daily Living Home Assistive Devices/Equipment: None ADL Screening (condition at time of admission) Patient's cognitive ability adequate to safely complete daily activities?: Yes Is the patient deaf or have difficulty hearing?: No Does the patient have difficulty seeing, even when wearing glasses/contacts?: No Does the patient have difficulty concentrating, remembering, or making decisions?: No Patient able to express need for assistance with ADLs?: Yes Does the patient have difficulty dressing or bathing?: No Independently performs ADLs?: Yes (appropriate for developmental age) Does the patient have difficulty walking or climbing stairs?:  No Weakness of Legs: None Weakness of Arms/Hands: None  Permission Sought/Granted                  Emotional Assessment Appearance:: Appears stated age Attitude/Demeanor/Rapport: Engaged Affect (typically observed): Accepting Orientation: : Oriented to Self, Oriented to Place, Oriented to Situation, Oriented to  Time Alcohol / Substance Use: Not Applicable Psych Involvement: Yes (comment)  Admission diagnosis:  Mental health evaluation Patient Active Problem List   Diagnosis Date Noted  . Alcohol abuse with alcohol-induced mood disorder (HCC) 06/04/2018  . Bipolar 1 disorder, manic, moderate (HCC) 02/11/2018  . Bipolar 1 disorder, manic, mild (HCC) 02/10/2018  . 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 Self-inflicted laceration of wrist 12/04/2017  . Severe recurrent major depression without psychotic features (HCC) 12/04/2017  . Hypokalemia 07/25/2016  . Elevated transaminase level 07/25/2016  . Leukopenia 07/25/2016  . Thrombocytopenia (HCC) 07/25/2016  . 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 Alcohol withdrawal (HCC) 07/24/2016  . Attention deficit hyperactivity disorder (ADHD) 02/11/2016  . Suicidal ideation 02/10/2016  . Involuntary commitment 02/10/2016  . Alcohol use disorder, severe, dependence (HCC) 02/10/2016  . Alcohol intoxication (HCC) 02/10/2016  . Unspecified mood (affective) disorder (HCC) 02/10/2016  . Tobacco use disorder 02/10/2016   PCP:  Patient, No Pcp Per Pharmacy:   [facility] - [place], [place] - [address] [place] [address] Phone: [phone] Fax: [phone]  [id number] - [place], [place] - [address] [place] [address] Phone: [phone] Fax: [phone]  Social Determinants of Health (SDOH) Interventions    Readmission Risk Interventions No flowsheet data found.

## 2018-06-04 NOTE — ED Notes (Addendum)
Called lab to have urine preg order added to urine in laboratory.

## 2019-05-23 NOTE — Progress Notes (Deleted)
PCP:  Patient, No Pcp Per   No chief complaint on file.   ?GW HPI:      [name] is a 29 y.o. No obstetric history on file. who LMP was No LMP recorded., presents today for her NP annual examination.  Her menses are {norm/abn:715}, lasting {number:22536} days.  Dysmenorrhea {dysmen:716}. She {does:18564} have intermenstrual bleeding.  Sex activity: {sex active:315163}.  Last Pap: {IDPO:242353614}  Results were: {norm/abn:16707::"no abnormalities"} /neg HPV DNA *** Hx of STDs: {STD hx:14358}  Last mammogram: {date:304500300}  Results were: {norm/abn:13465} There is no FH of breast cancer. There is no FH of ovarian cancer. The patient {does:18564} do self-breast exams.  Tobacco use: {tob:20664} Alcohol use: {Alcohol:11675} No drug use.  Exercise: {exercise:31265}  She {does:18564} get adequate calcium and Vitamin D in her diet.   Past Medical History:  Diagnosis Date  . ADHD (attention deficit hyperactivity disorder)   . Anxiety   . Hypertension   . Major depressive disorder     Past Surgical History:  Procedure Laterality Date  . TONSILLECTOMY    . WISDOM TOOTH EXTRACTION      Family History  Problem Relation Age of Onset  . Hypertension Father     Social History   Socioeconomic History  . Marital status: Single    Spouse name: Not on file  . Number of children: Not on file  . Years of education: Not on file  . Highest education level: Not on file  Occupational History  . Not on file  Tobacco Use  . Smoking status: Current Every Day Smoker    Packs/day: 1.00    Types: Cigarettes  . Smokeless tobacco: Never Used  Substance and Sexual Activity  . Alcohol use: Yes    Alcohol/week: 7.0 standard drinks    Types: 7 Glasses of wine per week    Comment: Hx ETOH  . Drug use: No  . Sexual activity: Yes    Birth control/protection: Condom  Other Topics Concern  . Not on file  Social History Narrative  . Not on file   Social  Determinants of Health   Financial Resource Strain:   . Difficulty of Paying Living Expenses:   Food Insecurity:   . Worried About Programme researcher, broadcasting/film/video in the Last Year:   . Barista in the Last Year:   Transportation Needs:   . Freight forwarder (Medical):   Marland Kitchen Lack of Transportation (Non-Medical):   Physical Activity:   . Days of Exercise per Week:   . Minutes of Exercise per Session:   Stress:   . Feeling of Stress :   Social Connections:   . Frequency of Communication with Friends and Family:   . Frequency of Social Gatherings with Friends and Family:   . Attends Religious Services:   . Active Member of Clubs or Organizations:   . Attends Banker Meetings:   Marland Kitchen Marital Status:   Intimate Partner Violence:   . Fear of Current or Ex-Partner:   . Emotionally Abused:   Marland Kitchen Physically Abused:   . Sexually Abused:      Current Outpatient Medications:  .  citalopram (CELEXA) 20 MG tablet, Take 1 tablet (20 mg total) by mouth daily., Disp: 31 tablet, Rfl: 0 .  folic acid (FOLVITE) 1 MG tablet, Take 1 tablet (1 mg total) by mouth daily., Disp: 30 tablet, Rfl: 1 .  hydrOXYzine (ATARAX/VISTARIL) 50 MG tablet, Take 1 tablet (50 mg total) by  mouth daily as needed for anxiety., Disp: 31 tablet, Rfl: 0 .  predniSONE (DELTASONE) 10 MG tablet, Take 6 tablets  today, on day 2 take 5 tablets, day 3 take 4 tablets, day 4 take 3 tablets, day 5 take  2 tablets and 1 tablet the last day, Disp: 21 tablet, Rfl: 0 .  QUEtiapine (SEROQUEL) 100 MG tablet, Take 1 tablet (100 mg total) by mouth at bedtime., Disp: 31 tablet, Rfl: 0 .  thiamine 100 MG tablet, Take 1 tablet (100 mg total) by mouth daily., Disp: 30 tablet, Rfl: 1     ROS:  Review of Systems BREAST: No symptoms   Objective: There were no vitals taken for this visit.   OBGyn Exam  Results: No results found for this or any previous visit (from the past 24 hour(s)).  Assessment/Plan: No diagnosis found.   No orders of the defined types were placed in this encounter.            GYN counsel {counseling:16159}     F/U  No follow-ups on file.  Marie Mckenzie B. Cheyne Boulden, PA-C 05/23/2019 7:43 PM

## 2019-05-24 ENCOUNTER — Ambulatory Visit: Payer: Self-pay | Admitting: Obstetrics and Gynecology

## 2019-06-07 ENCOUNTER — Encounter: Payer: Self-pay | Admitting: Advanced Practice Midwife

## 2019-06-07 ENCOUNTER — Other Ambulatory Visit: Payer: Self-pay

## 2019-06-07 ENCOUNTER — Ambulatory Visit (INDEPENDENT_AMBULATORY_CARE_PROVIDER_SITE_OTHER): Payer: Managed Care, Other (non HMO) | Admitting: Advanced Practice Midwife

## 2019-06-07 ENCOUNTER — Other Ambulatory Visit (HOSPITAL_COMMUNITY)
Admission: RE | Admit: 2019-06-07 | Discharge: 2019-06-07 | Disposition: A | Discharge status: Home / Self Care | Payer: Managed Care, Other (non HMO) | Source: Ambulatory Visit | Attending: Advanced Practice Midwife | Admitting: Advanced Practice Midwife

## 2019-06-07 VITALS — BP 128/66 | Ht 66.0 in | Wt 198.0 lb

## 2019-06-07 DIAGNOSIS — Z01411 Encounter for gynecological examination (general) (routine) with abnormal findings: Secondary | ICD-10-CM | POA: Diagnosis not present

## 2019-06-07 DIAGNOSIS — Z113 Encounter for screening for infections with a predominantly sexual mode of transmission: Secondary | ICD-10-CM | POA: Insufficient documentation

## 2019-06-07 DIAGNOSIS — Z0000 Encounter for general adult medical examination without abnormal findings: Secondary | ICD-10-CM

## 2019-06-07 DIAGNOSIS — Z01419 Encounter for gynecological examination (general) (routine) without abnormal findings: Secondary | ICD-10-CM | POA: Diagnosis present

## 2019-06-07 DIAGNOSIS — Z124 Encounter for screening for malignant neoplasm of cervix: Secondary | ICD-10-CM | POA: Insufficient documentation

## 2019-06-07 DIAGNOSIS — Z30011 Encounter for initial prescription of contraceptive pills: Secondary | ICD-10-CM

## 2019-06-07 DIAGNOSIS — N898 Other specified noninflammatory disorders of vagina: Secondary | ICD-10-CM

## 2019-06-07 MED ORDER — NORETHINDRONE 0.35 MG PO TABS: 1.0000 | ORAL_TABLET | Freq: Every day | ORAL | 4 refills | Status: DC

## 2019-06-07 NOTE — Progress Notes (Signed)
Gynecology Annual Exam   Date of Service: 06/07/2019  PCP: Marie Mckenzie, No Pcp Per  Chief Complaint:  Chief Complaint  Marie Mckenzie presents with  . Gynecologic Exam    vaginal discharge  . painful intercourse    History of Present Illness: Marie Mckenzie is a 29 y.o. G2P0020 presents for annual exam. The Marie Mckenzie has complaints today of yellow vaginal discharge with itching. She did try 3 day OTC Monistat with some relief. Her symptoms started about 2 months ago. Throughout the same time period she has noticed pain with intercourse. She denies vaginal dryness or other dyspareunia. She is unsure if she has ever had a PAP smear.  She reports adequate ongoing care for mental health. She denies current substance use. She smokes cigarettes about 1/2 PPD. We discussed switching to POP for birth control due to increased risk of stroke/cardiovascular disease from combination of OCP/smoking. She understands the importance of taking the POP at the same time daily and never missing a dose for optimal effectiveness.  LMP: Marie Mckenzie's last menstrual period was 05/24/2019 (exact date). Average Interval: regular, 28 days Duration of flow: 4 days Heavy Menses: no Clots: no Intermenstrual Bleeding: no Postcoital Bleeding: no Dysmenorrhea: no  The Marie Mckenzie is sexually active. She currently uses OCP (estrogen/progesterone) for contraception. She admits to dyspareunia.  The Marie Mckenzie does not perform self breast exams.  There is possibly notable family history of breast or ovarian cancer in her family. She thinks a cousin had ovarian cancer. She declines MyRisk testing.  The Marie Mckenzie wears seatbelts: yes.   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 She admits healthy diet, adequate hydration and adequate sleep.    The Marie Mckenzie denies current symptoms of depression.    Review of Systems: Review of Systems  Constitutional: Negative.   HENT: Negative.   Eyes: Negative.   Respiratory:  Negative.   Cardiovascular: Negative.   Gastrointestinal: Negative.   Genitourinary:       Vaginal itching and discharge, painful intercourse  Musculoskeletal: Negative.   Skin: Negative.   Neurological: Negative.   Endo/Heme/Allergies: Negative.   Psychiatric/Behavioral: Negative.     Past Medical History:  Marie Mckenzie Active Problem List   Diagnosis Date Noted  . Alcohol abuse with alcohol-induced mood disorder (HCC) 06/04/2018  . Bipolar 1 disorder, manic, moderate (HCC) 02/11/2018  . Bipolar 1 disorder, manic, mild (HCC) 02/10/2018  . 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 Self-inflicted laceration of wrist (HCC) 12/04/2017  . Severe recurrent major depression without psychotic features (HCC) 12/04/2017  . Hypokalemia 07/25/2016  . Elevated transaminase level 07/25/2016  . Leukopenia 07/25/2016  . Thrombocytopenia (HCC) 07/25/2016  . 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 Alcohol withdrawal (HCC) 07/24/2016  . Attention deficit hyperactivity disorder (ADHD) 02/11/2016  . Suicidal ideation 02/10/2016  . Involuntary commitment 02/10/2016  . Alcohol use disorder, severe, dependence (HCC) 02/10/2016  . Alcohol intoxication (HCC) 02/10/2016  . Unspecified mood (affective) disorder (HCC) 02/10/2016  . Tobacco use disorder 02/10/2016    Past Surgical History:  Past Surgical History:  Procedure Laterality Date  . TONSILLECTOMY    . WISDOM TOOTH EXTRACTION      Gynecologic History:  Marie Mckenzie's last menstrual period was 05/24/2019 (exact date). Contraception: OCP (estrogen/progesterone) Last Pap: never per Marie Mckenzie report  Obstetric History: G2P0020  Family History:  Family History  Problem Relation Age of Onset  . Hypertension Father     Social History:  Social History   Socioeconomic History  .  Marital status: Single    Spouse name: Not on file  . Number of children: Not on file  . Years of education: Not on file  . Highest  education level: Not on file  Occupational History  . Not on file  Tobacco Use  . Smoking status: Current Every Day Smoker    Packs/day: 1.00    Types: Cigarettes  . Smokeless tobacco: Never Used  Substance and Sexual Activity  . Alcohol use: Yes    Alcohol/week: 7.0 standard drinks    Types: 7 Glasses of wine per week    Comment: Hx ETOH  . Drug use: No  . Sexual activity: Yes    Birth control/protection: Condom  Other Topics Concern  . Not on file  Social History Narrative  . Not on file   Social Determinants of Health   Financial Resource Strain:   . Difficulty of Paying Living Expenses:   Food Insecurity:   . Worried About Programme researcher, broadcasting/film/video in the Last Year:   . Barista in the Last Year:   Transportation Needs:   . Freight forwarder (Medical):   Marland Kitchen Lack of Transportation (Non-Medical):   Physical Activity:   . Days of Exercise per Week:   . Minutes of Exercise per Session:   Stress:   . Feeling of Stress :   Social Connections:   . Frequency of Communication with Friends and Family:   . Frequency of Social Gatherings with Friends and Family:   . Attends Religious Services:   . Active Member of Clubs or Organizations:   . Attends Banker Meetings:   Marland Kitchen Marital Status:   Intimate Partner Violence:   . Fear of Current or Ex-Partner:   . Emotionally Abused:   Marland Kitchen Physically Abused:   . Sexually Abused:     Allergies:  No Known Allergies  Medications: Prior to Admission medications   Medication Sig Start Date End Date Taking? Authorizing Provider  amphetamine-dextroamphetamine (ADDERALL) 30 MG tablet TAKE 1 TABLET BY MOUTH EVERY MORNING HALF TABLET AT MIDDAY, AND 1 TABLET EVERY AFTERNOON 05/22/19  Yes [provider]  QUEtiapine (SEROQUEL) 100 MG tablet Take 1 tablet (100 mg total) by mouth at bedtime. 06/04/18  Yes Charm Rings, NP  norethindrone (MICRONOR) 0.35 MG tablet Take 1 tablet (0.35 mg total) by mouth daily. 06/07/19    Tresea Mall, CNM    Physical Exam Vitals: Blood pressure 128/66, height 5\' 6"  (1.676 m), weight 198 lb (89.8 kg), last menstrual period 05/24/2019.  General: NAD HEENT: normocephalic, anicteric Thyroid: no enlargement, no palpable nodules Pulmonary: No increased work of breathing, CTAB Cardiovascular: RRR, distal pulses 2+ Breast: Breast symmetrical, no tenderness, no palpable nodules or masses, no skin or nipple retraction present, no nipple discharge.  No axillary or supraclavicular lymphadenopathy. Abdomen: NABS, soft, non-tender, non-distended.  Umbilicus without lesions.  No hepatomegaly, splenomegaly or masses palpable. No evidence of hernia  Genitourinary:  External: Normal external female genitalia.  Normal urethral meatus, normal Bartholin's and Skene's glands.    Vagina: Normal vaginal mucosa, no evidence of prolapse, thin white discharge    Cervix: Grossly normal in appearance, no bleeding, no CMT  Uterus: Non-enlarged, mobile, normal contour.    Adnexa: ovaries non-enlarged, no adnexal masses  Rectal: deferred  Lymphatic: no evidence of inguinal lymphadenopathy Extremities: no edema, erythema, or tenderness Neurologic: Grossly intact Psychiatric: mood appropriate, affect full    Assessment: 29 y.o. G2P0020 routine annual exam, possible yeast infection  Plan: Problem List Items Addressed This Visit        Visit Diagnoses    Well woman exam with routine gynecological exam    -  Primary   Relevant Orders   Cytology - PAP   HIV Antibody (routine testing w rflx) (Completed)   RPR Qual (Completed)   Hepatitis B Surface AntiGEN (Completed)   Cervical cancer screening       Relevant Orders   Cytology - PAP   Screen for sexually transmitted diseases       Relevant Orders   Cytology - PAP   HIV Antibody (routine testing w rflx) (Completed)   RPR Qual (Completed)   Hepatitis B Surface AntiGEN (Completed)   Encounter for initial prescription of contraceptive pills        Relevant Medications   norethindrone (MICRONOR) 0.35 MG tablet   Vagina itching       Relevant Orders   Cervicovaginal ancillary only      1) STI screening  was offered and accepted  2)  ASCCP guidelines and rationale discussed.  Marie Mckenzie opts for every 3 years screening interval. First PAP smear today.  3) Contraception - the Marie Mckenzie is currently using  OCP (estrogen/progesterone).  She is interested in changing to POP  4) Routine healthcare maintenance including cholesterol, diabetes screening discussed Declines  5) Return in about 1 year (around 06/06/2020) for annual established gyn.   Rod Can, [facility] 06/08/2019, 10:32 AM

## 2019-06-07 NOTE — Patient Instructions (Signed)
Health Maintenance, Female Adopting a healthy lifestyle and getting preventive care are important in promoting health and wellness. Ask your health care provider about:  The right schedule for you to have regular tests and exams.  Things you can do on your own to prevent diseases and keep yourself healthy. What should I know about diet, weight, and exercise? Eat a healthy diet   Eat a diet that includes plenty of vegetables, fruits, low-fat dairy products, and lean protein.  Do not eat a lot of foods that are high in solid fats, added sugars, or sodium. Maintain a healthy weight Body mass index (BMI) is used to identify weight problems. It estimates body fat based on height and weight. Your health care provider can help determine your BMI and help you achieve or maintain a healthy weight. Get regular exercise Get regular exercise. This is one of the most important things you can do for your health. Most adults should:  Exercise for at least 150 minutes each week. The exercise should increase your heart rate and make you sweat (moderate-intensity exercise).  Do strengthening exercises at least twice a week. This is in addition to the moderate-intensity exercise.  Spend less time sitting. Even light physical activity can be beneficial. Watch cholesterol and blood lipids Have your blood tested for lipids and cholesterol at 31 years of age, then have this test every 5 years. Have your cholesterol levels checked more often if:  Your lipid or cholesterol levels are high.  You are older than 31 years of age.  You are at high risk for heart disease. What should I know about cancer screening? Depending on your health history and family history, you may need to have cancer screening at various ages. This may include screening for:  Breast cancer.  Cervical cancer.  Colorectal cancer.  Skin cancer.  Lung cancer. What should I know about heart disease, diabetes, and high blood  pressure? Blood pressure and heart disease  High blood pressure causes heart disease and increases the risk of stroke. This is more likely to develop in people who have high blood pressure readings, are of African descent, or are overweight.  Have your blood pressure checked: ? Every 3-5 years if you are 18-39 years of age. ? Every year if you are 40 years old or older. Diabetes Have regular diabetes screenings. This checks your fasting blood sugar level. Have the screening done:  Once every three years after age 40 if you are at a normal weight and have a low risk for diabetes.  More often and at a younger age if you are overweight or have a high risk for diabetes. What should I know about preventing infection? Hepatitis B If you have a higher risk for hepatitis B, you should be screened for this virus. Talk with your health care provider to find out if you are at risk for hepatitis B infection. Hepatitis C Testing is recommended for:  Everyone born from 1945 through 1965.  Anyone with known risk factors for hepatitis C. Sexually transmitted infections (STIs)  Get screened for STIs, including gonorrhea and chlamydia, if: ? You are sexually active and are younger than 31 years of age. ? You are older than 31 years of age and your health care provider tells you that you are at risk for this type of infection. ? Your sexual activity has changed since you were last screened, and you are at increased risk for chlamydia or gonorrhea. Ask your health care provider if   you are at risk.  Ask your health care provider about whether you are at high risk for HIV. Your health care provider may recommend a prescription medicine to help prevent HIV infection. If you choose to take medicine to prevent HIV, you should first get tested for HIV. You should then be tested every 3 months for as long as you are taking the medicine. Pregnancy  If you are about to stop having your period (premenopausal) and  you may become pregnant, seek counseling before you get pregnant.  Take 400 to 800 micrograms (mcg) of folic acid every day if you become pregnant.  Ask for birth control (contraception) if you want to prevent pregnancy. Osteoporosis and menopause Osteoporosis is a disease in which the bones lose minerals and strength with aging. This can result in bone fractures. If you are 65 years old or older, or if you are at risk for osteoporosis and fractures, ask your health care provider if you should:  Be screened for bone loss.  Take a calcium or vitamin D supplement to lower your risk of fractures.  Be given hormone replacement therapy (HRT) to treat symptoms of menopause. Follow these instructions at home: Lifestyle  Do not use any products that contain nicotine or tobacco, such as cigarettes, e-cigarettes, and chewing tobacco. If you need help quitting, ask your health care provider.  Do not use street drugs.  Do not share needles.  Ask your health care provider for help if you need support or information about quitting drugs. Alcohol use  Do not drink alcohol if: ? Your health care provider tells you not to drink. ? You are pregnant, may be pregnant, or are planning to become pregnant.  If you drink alcohol: ? Limit how much you use to 0-1 drink a day. ? Limit intake if you are breastfeeding.  Be aware of how much alcohol is in your drink. In the [place] one drink equals one 12 oz bottle of beer (355 mL), one 5 oz glass of wine (148 mL), or one 1 oz glass of hard liquor (44 mL). General instructions  Schedule regular health, dental, and eye exams.  Stay current with your vaccines.  Tell your health care provider if: ? You often feel depressed. ? You have ever been abused or do not feel safe at home. Summary  Adopting a healthy lifestyle and getting preventive care are important in promoting health and wellness.  Follow your health care provider's instructions about healthy  diet, exercising, and getting tested or screened for diseases.  Follow your health care provider's instructions on monitoring your cholesterol and blood pressure. This information is not intended to replace advice given to you by your health care provider. Make sure you discuss any questions you have with your health care provider. Document Revised: 02/17/2018 Document Reviewed: 02/17/2018 Elsevier Patient Education  2020 Elsevier Inc.  

## 2019-06-08 ENCOUNTER — Other Ambulatory Visit: Payer: Self-pay | Admitting: Advanced Practice Midwife

## 2019-06-08 DIAGNOSIS — N760 Acute vaginitis: Secondary | ICD-10-CM

## 2019-06-08 DIAGNOSIS — B3731 Acute candidiasis of vulva and vagina: Secondary | ICD-10-CM

## 2019-06-08 DIAGNOSIS — B373 Candidiasis of vulva and vagina: Secondary | ICD-10-CM

## 2019-06-08 DIAGNOSIS — B9689 Other specified bacterial agents as the cause of diseases classified elsewhere: Secondary | ICD-10-CM

## 2019-06-08 LAB — CERVICOVAGINAL ANCILLARY ONLY
Bacterial Vaginitis (gardnerella): POSITIVE — AB
Candida Glabrata: NEGATIVE
Candida Vaginitis: NEGATIVE
Comment: NEGATIVE
Comment: NEGATIVE
Comment: NEGATIVE

## 2019-06-08 LAB — HIV ANTIBODY (ROUTINE TESTING W REFLEX): HIV Screen 4th Generation wRfx: NONREACTIVE

## 2019-06-08 LAB — HEPATITIS B SURFACE ANTIGEN: Hepatitis B Surface Ag: NEGATIVE

## 2019-06-08 LAB — RPR QUALITATIVE: RPR Ser Ql: NONREACTIVE

## 2019-06-08 MED ORDER — METRONIDAZOLE 0.75 % VA GEL: 1.0000 | Freq: Every day | VAGINAL | 1 refills | Status: AC

## 2019-06-08 MED ORDER — FLUCONAZOLE 150 MG PO TABS: 150.0000 mg | ORAL_TABLET | Freq: Once | ORAL | 1 refills | Status: AC

## 2019-06-08 NOTE — Progress Notes (Unsigned)
Rx metronidazole gel sent to treat BV. Diflucan sent in case yeast develops after abx treatment.

## 2019-06-09 LAB — CYTOLOGY - PAP
Chlamydia: NEGATIVE
Comment: NEGATIVE
Comment: NEGATIVE
Comment: NEGATIVE
Comment: NORMAL
Diagnosis: UNDETERMINED — AB
High risk HPV: NEGATIVE
Neisseria Gonorrhea: NEGATIVE
Trichomonas: POSITIVE — AB

## 2019-06-22 ENCOUNTER — Other Ambulatory Visit: Payer: Self-pay | Admitting: Advanced Practice Midwife

## 2019-06-22 DIAGNOSIS — A599 Trichomoniasis, unspecified: Secondary | ICD-10-CM

## 2019-06-22 MED ORDER — METRONIDAZOLE 500 MG PO TABS: 2000.0000 mg | ORAL_TABLET | Freq: Once | ORAL | 0 refills | Status: AC

## 2019-06-22 NOTE — Progress Notes (Unsigned)
Rx Metronidazole sent to patient's pharmacy. Spoke with patient to let her know. Instructions given.

## 2019-09-29 ENCOUNTER — Telehealth: Payer: Self-pay

## 2019-09-29 NOTE — Telephone Encounter (Signed)
Pt calling; is having a very painful period.  What to do? [phone]

## 2019-09-29 NOTE — Telephone Encounter (Signed)
Take NSAIDs and use heating pad. I'll send her a MyChart message also.

## 2020-04-01 ENCOUNTER — Encounter: Payer: Self-pay | Admitting: Emergency Medicine

## 2020-04-01 ENCOUNTER — Ambulatory Visit (INDEPENDENT_AMBULATORY_CARE_PROVIDER_SITE_OTHER): Payer: 59

## 2020-04-01 ENCOUNTER — Ambulatory Visit
Admission: EM | Admit: 2020-04-01 | Discharge: 2020-04-01 | Disposition: A | Discharge status: Home / Self Care | Payer: 59 | Attending: Physician Assistant | Admitting: Physician Assistant

## 2020-04-01 ENCOUNTER — Other Ambulatory Visit: Payer: Self-pay

## 2020-04-01 DIAGNOSIS — M25562 Pain in left knee: Secondary | ICD-10-CM

## 2020-04-01 DIAGNOSIS — S86912A Strain of unspecified muscle(s) and tendon(s) at lower leg level, left leg, initial encounter: Secondary | ICD-10-CM | POA: Diagnosis not present

## 2020-04-01 DIAGNOSIS — W19XXXA Unspecified fall, initial encounter: Secondary | ICD-10-CM

## 2020-04-01 NOTE — ED Provider Notes (Signed)
[facility]    CSN: [id number] Arrival date & time: 04/01/20  1216      History   Chief Complaint Chief Complaint  Patient presents with  . Fall    03/31/20  . Knee Pain    left    HPI Marie Mckenzie is a 30 y.o. female presents to the emergency department for evaluation of left knee pain.  Yesterday she slipped on ice and twisted her left knee.  She states that she feels that she mostly hyperextended the knee and felt pain along the popliteal region.  She denies any swelling.  She denies feeling a pop or tear.  She has been taking ibuprofen with little relief.  Yesterday after the injury she felt soreness, was able to walk with no significant swelling but today she feels more pain and discomfort and is limping more.  She denies any groin or thigh pain.  No back pain.  No other injury to her body.  HPI  Past Medical History:  Diagnosis Date  . ADHD (attention deficit hyperactivity disorder)   . Anxiety   . Hypertension   . Major depressive disorder     Patient Active Problem List   Diagnosis Date Noted  . Alcohol abuse with alcohol-induced mood disorder (HCC) 06/04/2018  . Bipolar 1 disorder, manic, moderate (HCC) 02/11/2018  . Bipolar 1 disorder, manic, mild (HCC) 02/10/2018  . 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 Self-inflicted laceration of wrist (HCC) 12/04/2017  . Severe recurrent major depression without psychotic features (HCC) 12/04/2017  . Hypokalemia 07/25/2016  . Elevated transaminase level 07/25/2016  . Leukopenia 07/25/2016  . Thrombocytopenia (HCC) 07/25/2016  . 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 Alcohol withdrawal (HCC) 07/24/2016  . Attention deficit hyperactivity disorder (ADHD) 02/11/2016  . Suicidal ideation 02/10/2016  . Involuntary commitment 02/10/2016  . Alcohol use disorder, severe, dependence (HCC) 02/10/2016  . Alcohol intoxication (HCC) 02/10/2016  . Unspecified mood (affective)  disorder (HCC) 02/10/2016  . Tobacco use disorder 02/10/2016    Past Surgical History:  Procedure Laterality Date  . TONSILLECTOMY    . WISDOM TOOTH EXTRACTION      OB History    Gravida  2   Para      Term      Preterm      AB  2   Living        SAB      IAB      Ectopic      Multiple      Live Births               Home Medications    Prior to Admission medications   Medication Sig Start Date End Date Taking? Authorizing Provider  amphetamine-dextroamphetamine (ADDERALL) 30 MG tablet TAKE 1 TABLET BY MOUTH EVERY MORNING HALF TABLET AT MIDDAY, AND 1 TABLET EVERY AFTERNOON 05/22/19  Yes [provider]  QUEtiapine (SEROQUEL) 100 MG tablet Take 1 tablet (100 mg total) by mouth at bedtime. 06/04/18  Yes Charm Rings, NP  norethindrone (MICRONOR) 0.35 MG tablet Take 1 tablet (0.35 mg total) by mouth daily. 06/07/19   Tresea Mall, CNM    Family History Family History  Problem Relation Age of Onset  . Hypertension Father   . Other Father        smoke inhalation  . Other Mother        unknown medical hsitory    Social History Social History   Tobacco  Use  . Smoking status: Current Every Day Smoker    Packs/day: 1.00    Years: 15.00    Pack years: 15.00    Types: Cigarettes  . Smokeless tobacco: Never Used  Vaping Use  . Vaping Use: Never used  Substance Use Topics  . Alcohol use: Yes    Alcohol/week: 7.0 standard drinks    Types: 7 Glasses of wine per week    Comment: Hx ETOH  . Drug use: No     Allergies   Patient has no known allergies.   Review of Systems Review of Systems  Musculoskeletal: Positive for arthralgias, gait problem and myalgias. Negative for joint swelling and neck pain.  Skin: Negative for rash and wound.  Neurological: Negative for numbness.     Physical Exam Triage Vital Signs ED Triage Vitals  Enc Vitals Group     BP 04/01/20 1238 (!) 135/99     Pulse Rate 04/01/20 1238 (!) 121     Resp  04/01/20 1238 18     Temp 04/01/20 1238 98.4 F (36.9 C)     Temp Source 04/01/20 1238 Oral     SpO2 04/01/20 1238 97 %     Weight 04/01/20 1240 200 lb (90.7 kg)     Height 04/01/20 1240 5\' 6"  (1.676 m)     Head Circumference --      Peak Flow --      Pain Score 04/01/20 1239 7     Pain Loc --      Pain Edu? --      Excl. in GC? --    No data found.  Updated Vital Signs BP (!) 135/99 (BP Location: Left Arm)   Pulse (!) 121   Temp 98.4 F (36.9 C) (Oral)   Resp 18   Ht 5\' 6"  (1.676 m)   Wt 200 lb (90.7 kg)   LMP 03/11/2020 (Approximate)   SpO2 97%   BMI 32.28 kg/m   Visual Acuity Right Eye Distance:   Left Eye Distance:   Bilateral Distance:    Right Eye Near:   Left Eye Near:    Bilateral Near:     Physical Exam Constitutional:      Appearance: She is well-developed and well-nourished.  HENT:     Head: Normocephalic and atraumatic.  Eyes:     Conjunctiva/sclera: Conjunctivae normal.  Cardiovascular:     Rate and Rhythm: Normal rate.  Pulmonary:     Effort: Pulmonary effort is normal. No respiratory distress.  Musculoskeletal:        General: Normal range of motion.     Cervical back: Normal range of motion.     Comments: Left lower extremity shows normal hip internal and external rotation.  She is able to straight leg raise.  No defect in quad tendon or patellar tendon.  Patella tracks well.  MCL and LCL stable to stress testing.  No effusion noted.  Range of motion 0 to 120 degrees.  Slight pain with hyperflexion and McMurray's test.  Skin:    General: Skin is warm.     Findings: No rash.  Neurological:     Mental Status: She is alert and oriented to person, place, and time.  Psychiatric:        Mood and Affect: Mood and affect normal.        Behavior: Behavior normal.        Thought Content: Thought content normal.      UC Treatments / Results  Labs (all labs ordered are listed, but only abnormal results are displayed) Labs Reviewed - No data to  display  EKG   Radiology DG Knee Complete 4 Views Left  Result Date: 04/01/2020 CLINICAL DATA:  Status post fall with knee pain. EXAM: LEFT KNEE - COMPLETE 4+ VIEW COMPARISON:  None. FINDINGS: No evidence of fracture, dislocation, or joint effusion. No evidence of arthropathy or other focal bone abnormality. Soft tissues are unremarkable. IMPRESSION: Negative. Electronically Signed   By: Sherian Rein M.D.   On: 04/01/2020 13:07    X-rays of the left knee reviewed by me today show no evidence of acute bony abnormality.  Adequate joint space in the medial lateral compartments.  Patella tracks well in the trochlear groove.  No evidence of abnormal bony lesions, loose bodies or effusion.  Procedures Procedures (including critical care time)  Medications Ordered in UC Medications - No data to display  Initial Impression / Assessment and Plan / UC Course  I have reviewed the triage vital signs and the nursing notes.  Pertinent labs & imaging results that were available during my care of the patient were reviewed by me and considered in my medical decision making (see chart for details).     30 year old female with left knee injury.  Does not appear to have any effusion or ligamentous laxity.  X-rays negative.  She is given crutches to help with ambulation.  Encouraged lots of rest ice elevation and compression.  She will alternate Tylenol and ibuprofen and slowly progress activity as tolerated.  If no improvement 1 week recommend follow-up with orthopedics for recheck. Final Clinical Impressions(s) / UC Diagnoses   Final diagnoses:  Fall, initial encounter  Knee strain, left, initial encounter     Discharge Instructions     Please rest ice and elevate the left knee.  You may alternate Tylenol and ibuprofen as needed for pain.  Use crutches as needed for ambulation.  If no improvement in 1 week call orthopedics to schedule follow-up appointment.    ED Prescriptions    None      PDMP not reviewed this encounter.   Evon Slack, [place] 04/01/20 1336

## 2020-04-01 NOTE — Discharge Instructions (Addendum)
Please rest ice and elevate the left knee.  You may alternate Tylenol and ibuprofen as needed for pain.  Use crutches as needed for ambulation.  If no improvement in 1 week call orthopedics to schedule follow-up appointment.

## 2020-04-01 NOTE — ED Triage Notes (Signed)
Patient in today c/o left knee pain after falling on ice yesterday (03/31/20). Patient states when she fell her left leg bent up underneath her. Patient has taken OTC Ibuprofen.

## 2020-07-12 ENCOUNTER — Encounter: Payer: Self-pay | Admitting: Emergency Medicine

## 2020-07-12 ENCOUNTER — Ambulatory Visit
Admission: EM | Admit: 2020-07-12 | Discharge: 2020-07-12 | Disposition: A | Discharge status: Home / Self Care | Payer: 59 | Attending: Sports Medicine | Admitting: Sports Medicine

## 2020-07-12 ENCOUNTER — Other Ambulatory Visit: Payer: Self-pay

## 2020-07-12 DIAGNOSIS — R0981 Nasal congestion: Secondary | ICD-10-CM | POA: Diagnosis present

## 2020-07-12 DIAGNOSIS — R0982 Postnasal drip: Secondary | ICD-10-CM

## 2020-07-12 DIAGNOSIS — U071 COVID-19: Secondary | ICD-10-CM

## 2020-07-12 DIAGNOSIS — R0602 Shortness of breath: Secondary | ICD-10-CM | POA: Diagnosis present

## 2020-07-12 DIAGNOSIS — J069 Acute upper respiratory infection, unspecified: Secondary | ICD-10-CM

## 2020-07-12 DIAGNOSIS — R059 Cough, unspecified: Secondary | ICD-10-CM

## 2020-07-12 DIAGNOSIS — M791 Myalgia, unspecified site: Secondary | ICD-10-CM

## 2020-07-12 LAB — RESP PANEL BY RT-PCR (FLU A&B, COVID) ARPGX2
Influenza A by PCR: NEGATIVE
Influenza B by PCR: NEGATIVE
SARS Coronavirus 2 by RT PCR: POSITIVE — AB

## 2020-07-12 MED ORDER — FLUTICASONE PROPIONATE 50 MCG/ACT NA SUSP: 2.0000 | Freq: Every day | NASAL | 0 refills | Status: DC

## 2020-07-12 MED ORDER — MOLNUPIRAVIR EUA 200MG CAPSULE: 4.0000 | ORAL_CAPSULE | Freq: Two times a day (BID) | ORAL | 0 refills | Status: AC

## 2020-07-12 MED ORDER — BENZONATATE 100 MG PO CAPS: 200.0000 mg | ORAL_CAPSULE | Freq: Three times a day (TID) | ORAL | 0 refills | Status: DC | PRN

## 2020-07-12 NOTE — Discharge Instructions (Addendum)
Please see educational handouts. Your test results show that you do have a infection with COVID-19.  Influenza testing was negative. You need to quarantine for at least 5 days per the current CDC guidelines. I sent in medication for your COVID-19 infection, as well as a prescription for your congestion and cough. Plenty of rest, plenty of fluids, Tylenol or Motrin for any fever or discomfort. Since you do not have a primary care provider, if your symptoms were to worsen in any way you need to call 911 or go to the ER. I also provided you a work note keeping her out of work for at least 5 days which will be extended if you call our office as you cannot go back to work until you are asymptomatic.

## 2020-07-12 NOTE — ED Triage Notes (Signed)
Pt c/o cough, shortness of breath, fatigue, and nasal congestion and sinus pressure. Started about 2 days ago. Has not had covid vaccine. Denies fever.

## 2020-07-13 NOTE — ED Provider Notes (Signed)
[facility]    CSN: [id number] Arrival date & time: 07/12/20  1436      History   Chief Complaint Chief Complaint  Patient presents with  . Cough  . Shortness of Breath  . Fatigue    HPI Noriko Macari is a 30 y.o. female.   Pleasant 30 year old female who presents for evaluation of the above issue.  She reports 3 days of cough, shortness of breath, fatigue, nasal congestion, and sinus pressure.  She also reports myalgias.  She is not vaccinated against COVID-19.  No flu vaccine.  No COVID history.  She has no known COVID exposure although she says that her roommate has been sick recently but has not gone to seek out any medical care.  She recently gained employment at [employer] and she is concerned because she cannot take time off work.  She denies any fever shakes chills.  No nausea vomiting or diarrhea.  She denies any abdominal pain or urinary symptoms.  She denies any headache or sore throat.  She denies any chest pain but does have shortness of breath especially with ambulation and activity.  No red flag signs or symptoms are elicited on history.  I did an extensive chart review, and her past medical history is significant for ADHD ADHD, anxiety, alcohol abuse with induced mood disorder significant forAnxiety, major depressive disorder, alcohol abuse with alcohol induced mood disorder, bipolar disorder, elevated transaminase, involuntary commitment, suicidal ideation, alcohol withdrawal syndrome, alcohol use disorder that is severe, and tobacco use disorder.     Past Medical History:  Diagnosis Date  . ADHD (attention deficit hyperactivity disorder)   . Anxiety   . Hypertension   . Major depressive disorder     Patient Active Problem List   Diagnosis Date Noted  . Alcohol abuse with alcohol-induced mood disorder (HCC) 06/04/2018  . Bipolar 1 disorder, manic, moderate (HCC) 02/11/2018  . Bipolar 1 disorder, manic, mild (HCC) 02/10/2018  . 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 Self-inflicted laceration of wrist (HCC) 12/04/2017  . Severe recurrent major depression without psychotic features (HCC) 12/04/2017  . Hypokalemia 07/25/2016  . Elevated transaminase level 07/25/2016  . Leukopenia 07/25/2016  . Thrombocytopenia (HCC) 07/25/2016  . 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 Alcohol withdrawal (HCC) 07/24/2016  . Attention deficit hyperactivity disorder (ADHD) 02/11/2016  . Suicidal ideation 02/10/2016  . Involuntary commitment 02/10/2016  . Alcohol use disorder, severe, dependence (HCC) 02/10/2016  . Alcohol intoxication (HCC) 02/10/2016  . Unspecified mood (affective) disorder (HCC) 02/10/2016  . Tobacco use disorder 02/10/2016    Past Surgical History:  Procedure Laterality Date  . TONSILLECTOMY    . WISDOM TOOTH EXTRACTION      OB History    Gravida  2   Para      Term      Preterm      AB  2   Living        SAB      IAB      Ectopic      Multiple      Live Births               Home Medications    Prior to Admission medications   Medication Sig Start Date End Date Taking? Authorizing Provider  amphetamine-dextroamphetamine (ADDERALL) 30 MG tablet TAKE 1 TABLET BY MOUTH EVERY MORNING HALF TABLET AT MIDDAY, AND 1 TABLET EVERY AFTERNOON 05/22/19  Yes [provider]  benzonatate (TESSALON)  100 MG capsule Take 2 capsules (200 mg total) by mouth 3 (three) times daily as needed for cough. 07/12/20  Yes Delton SeeBarnes, Quierra Silverio, MD  fluticasone [facility](FLONASE) 50 MCG/ACT nasal spray Place 2 sprays into both nostrils daily. 07/12/20  Yes Delton SeeBarnes, Cesiah Westley, MD  molnupiravir EUA 200 mg CAPS Take 4 capsules (800 mg total) by mouth 2 (two) times daily for 5 days. 07/12/20 07/17/20 Yes Delton SeeBarnes, Liseth Wann, MD  QUEtiapine (SEROQUEL) 100 MG tablet Take 1 tablet (100 mg total) by mouth at bedtime. 06/04/18  Yes Charm RingsLord, Jamison Y, NP  norethindrone (MICRONOR) 0.35 MG tablet Take 1 tablet (0.35 mg  total) by mouth daily. 06/07/19   Tresea MallGledhill, Jane, CNM    Family History Family History  Problem Relation Age of Onset  . Hypertension Father   . Other Father        smoke inhalation  . Other Mother        unknown medical hsitory    Social History Social History   Tobacco Use  . Smoking status: Current Every Day Smoker    Packs/day: 1.00    Years: 15.00    Pack years: 15.00    Types: Cigarettes  . Smokeless tobacco: Never Used  Vaping Use  . Vaping Use: Never used  Substance Use Topics  . Alcohol use: Yes    Alcohol/week: 7.0 standard drinks    Types: 7 Glasses of wine per week    Comment: Hx ETOH  . Drug use: No     Allergies   Patient has no known allergies.   Review of Systems Review of Systems  Constitutional: Positive for activity change and fatigue. Negative for appetite change, chills, diaphoresis and fever.  HENT: Positive for congestion, postnasal drip and sinus pressure. Negative for ear pain, rhinorrhea, sinus pain and sore throat.   Eyes: Negative.  Negative for pain.  Respiratory: Positive for cough and shortness of breath. Negative for chest tightness and wheezing.   Cardiovascular: Negative.  Negative for chest pain and palpitations.  Gastrointestinal: Negative.  Negative for abdominal pain, diarrhea, nausea and vomiting.  Genitourinary: Negative.  Negative for dysuria.  Musculoskeletal: Positive for myalgias. Negative for arthralgias, back pain, neck pain and neck stiffness.  Skin: Negative.  Negative for color change, pallor, rash and wound.  Neurological: Negative for dizziness, syncope, light-headedness, numbness and headaches.  All other systems reviewed and are negative.    Physical Exam Triage Vital Signs ED Triage Vitals  Enc Vitals Group     BP 07/12/20 1451 115/77     Pulse Rate 07/12/20 1451 (!) 103     Resp 07/12/20 1451 18     Temp 07/12/20 1451 98.3 F (36.8 C)     Temp Source 07/12/20 1451 Oral     SpO2 07/12/20 1451 97 %      Weight 07/12/20 1452 199 lb 15.3 oz (90.7 kg)     Height 07/12/20 1452 5\' 6"  (1.676 m)     Head Circumference --      Peak Flow --      Pain Score 07/12/20 1452 4     Pain Loc --      Pain Edu? --      Excl. in GC? --    No data found.  Updated Vital Signs BP 115/77 (BP Location: Right Arm)   Pulse (!) 103   Temp 98.3 F (36.8 C) (Oral)   Resp 18   Ht 5\' 6"  (1.676 m)   Wt 90.7 kg   LMP  07/09/2020 (Approximate)   SpO2 97%   BMI 32.27 kg/m   Visual Acuity Right Eye Distance:   Left Eye Distance:   Bilateral Distance:    Right Eye Near:   Left Eye Near:    Bilateral Near:     Physical Exam Vitals and nursing note reviewed.  Constitutional:      General: She is not in acute distress.    Appearance: She is well-developed. She is ill-appearing. She is not toxic-appearing or diaphoretic.  HENT:     Head: Normocephalic and atraumatic.     Right Ear: Tympanic membrane normal.     Left Ear: Tympanic membrane normal.     Nose: Congestion present. No rhinorrhea.     Mouth/Throat:     Mouth: Mucous membranes are moist.     Pharynx: No pharyngeal swelling or oropharyngeal exudate.  Eyes:     Extraocular Movements: Extraocular movements intact.     Pupils: Pupils are equal, round, and reactive to light.  Neck:     Vascular: No JVD.     Trachea: No tracheal deviation.  Cardiovascular:     Rate and Rhythm: Regular rhythm. Tachycardia present.     Pulses: Normal pulses.     Heart sounds: Normal heart sounds. No murmur heard. No friction rub. No gallop.   Pulmonary:     Effort: Pulmonary effort is normal.     Breath sounds: Normal breath sounds. No decreased breath sounds, wheezing, rhonchi or rales.  Musculoskeletal:     Cervical back: Normal range of motion and neck supple.  Lymphadenopathy:     Cervical: Cervical adenopathy present.  Skin:    General: Skin is warm and dry.     Capillary Refill: Capillary refill takes less than 2 seconds.     Coloration: Skin is not  cyanotic.     Findings: No ecchymosis, erythema or rash.  Neurological:     General: No focal deficit present.     Mental Status: She is alert and oriented to person, place, and time.      UC Treatments / Results  Labs (all labs ordered are listed, but only abnormal results are displayed) Labs Reviewed  RESP PANEL BY RT-PCR (FLU A&B, COVID) ARPGX2 - Abnormal; Notable for the following components:      Result Value   SARS Coronavirus 2 by RT PCR POSITIVE (*)    All other components within normal limits    EKG   Radiology No results found.  Procedures Procedures (including critical care time)  Medications Ordered in UC Medications - No data to display  Initial Impression / Assessment and Plan / UC Course  I have reviewed the triage vital signs and the nursing notes.  Pertinent labs & imaging results that were available during my care of the patient were reviewed by me and considered in my medical decision making (see chart for details).  Clinical impression: 30 year old female who presents with symptoms concerning for an influenza type illness versus COVID-19.  She has cough shortness of breath fatigue nasal congestion and sinus pressure as well as myalgias.  She is tachycardic on presentation and is unwell appearing but in no acute distress and is nontoxic-appearing.  Treatment plan: 1.  The findings and treatment plan were discussed in detail with the patient.  Patient was in agreement. 2.  We will do a respiratory panel.  It does show that she has COVID-19.  She is negative for influenza. 3.  Prescribed molnupivir and sent that to her  pharmacy.  I also prescribed Tessalon Perles for her cough, and Flonase for her congestion.  She can also get that at her pharmacy. 4.  She will need to quarantine for least 5 days per the current CDC guidelines. 5.  Provided a work note keeping her out of work for at least 5 days. 6.  Educational handouts provided. 7.  Supportive care,  over-the-counter meds as needed, Tylenol or Motrin for any fever or discomfort. 8.  She does not identify with a primary care provider.  Therefore if her symptoms were to worsen she needs to go to the ER or call 911.  She voiced verbal understanding. 9.  She was discharged in stable condition and she will follow-up here as needed.    Final Clinical Impressions(s) / UC Diagnoses   Final diagnoses:  Upper respiratory tract infection, unspecified type  Cough  SOB (shortness of breath)  Nasal congestion  Post-nasal drip  COVID-19  Myalgia     Discharge Instructions     Please see educational handouts. Your test results show that you do have a infection with COVID-19.  Influenza testing was negative. You need to quarantine for at least 5 days per the current CDC guidelines. I sent in medication for your COVID-19 infection, as well as a prescription for your congestion and cough. Plenty of rest, plenty of fluids, Tylenol or Motrin for any fever or discomfort. Since you do not have a primary care provider, if your symptoms were to worsen in any way you need to call 911 or go to the ER. I also provided you a work note keeping her out of work for at least 5 days which will be extended if you call our office as you cannot go back to work until you are asymptomatic.    ED Prescriptions    Medication Sig Dispense Auth. Provider   benzonatate (TESSALON) 100 MG capsule Take 2 capsules (200 mg total) by mouth 3 (three) times daily as needed for cough. 30 capsule Delton See, MD   fluticasone [facility]) 50 MCG/ACT nasal spray Place 2 sprays into both nostrils daily. 15.8 mL Delton See, MD   molnupiravir EUA 200 mg CAPS Take 4 capsules (800 mg total) by mouth 2 (two) times daily for 5 days. 40 capsule Delton See, MD     PDMP not reviewed this encounter.   Delton See, MD 07/14/20 1037

## 2021-07-23 IMAGING — CR DG KNEE COMPLETE 4+V*L*
4 series · 4 of 4 positions shown · non-contrast
Comparison: None.

CLINICAL DATA: Status post fall with knee pain.

EXAM:
LEFT KNEE - COMPLETE 4+ VIEW

[knee ap]
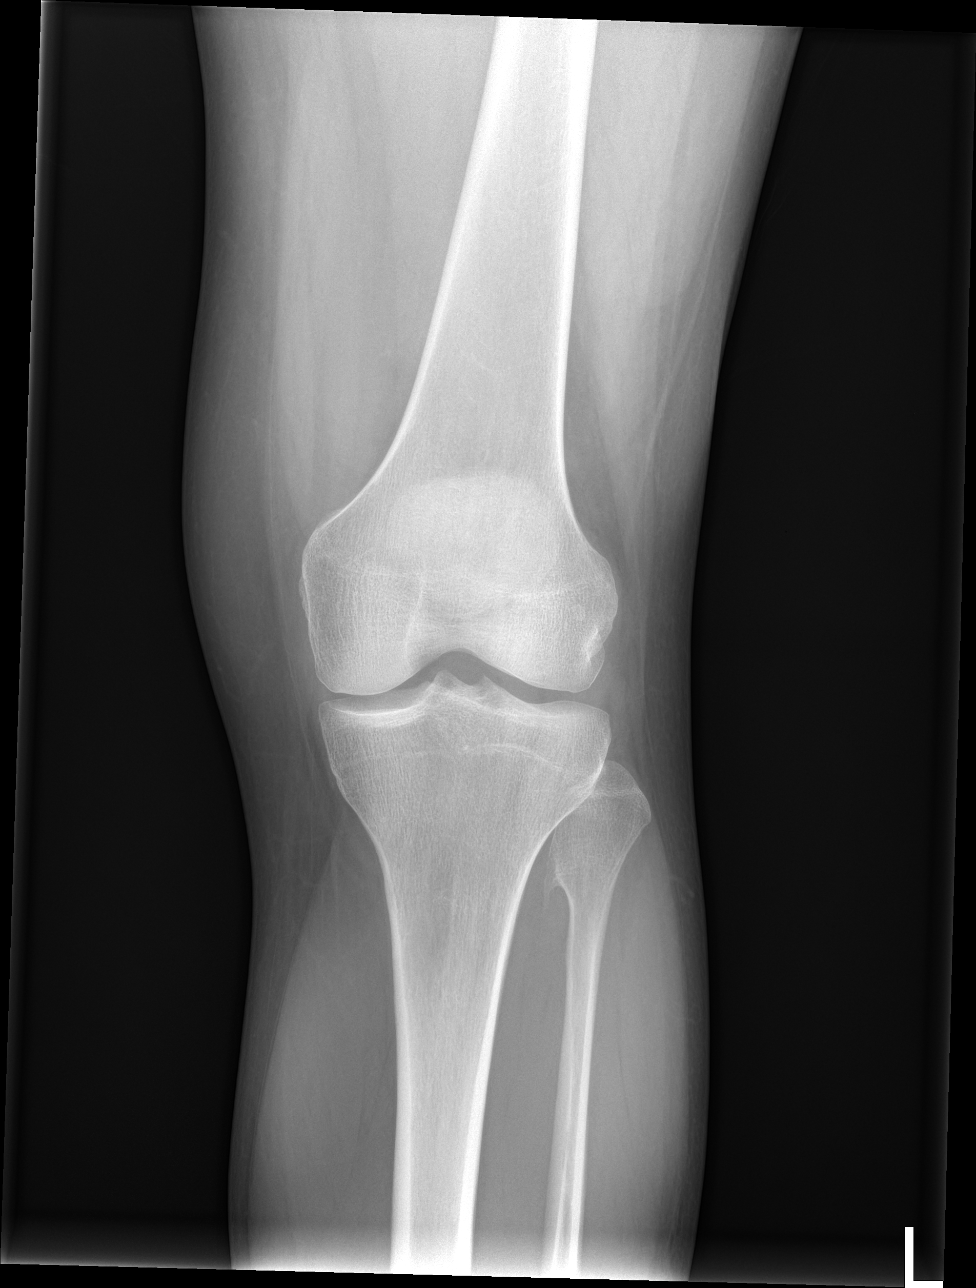

[knee lat]
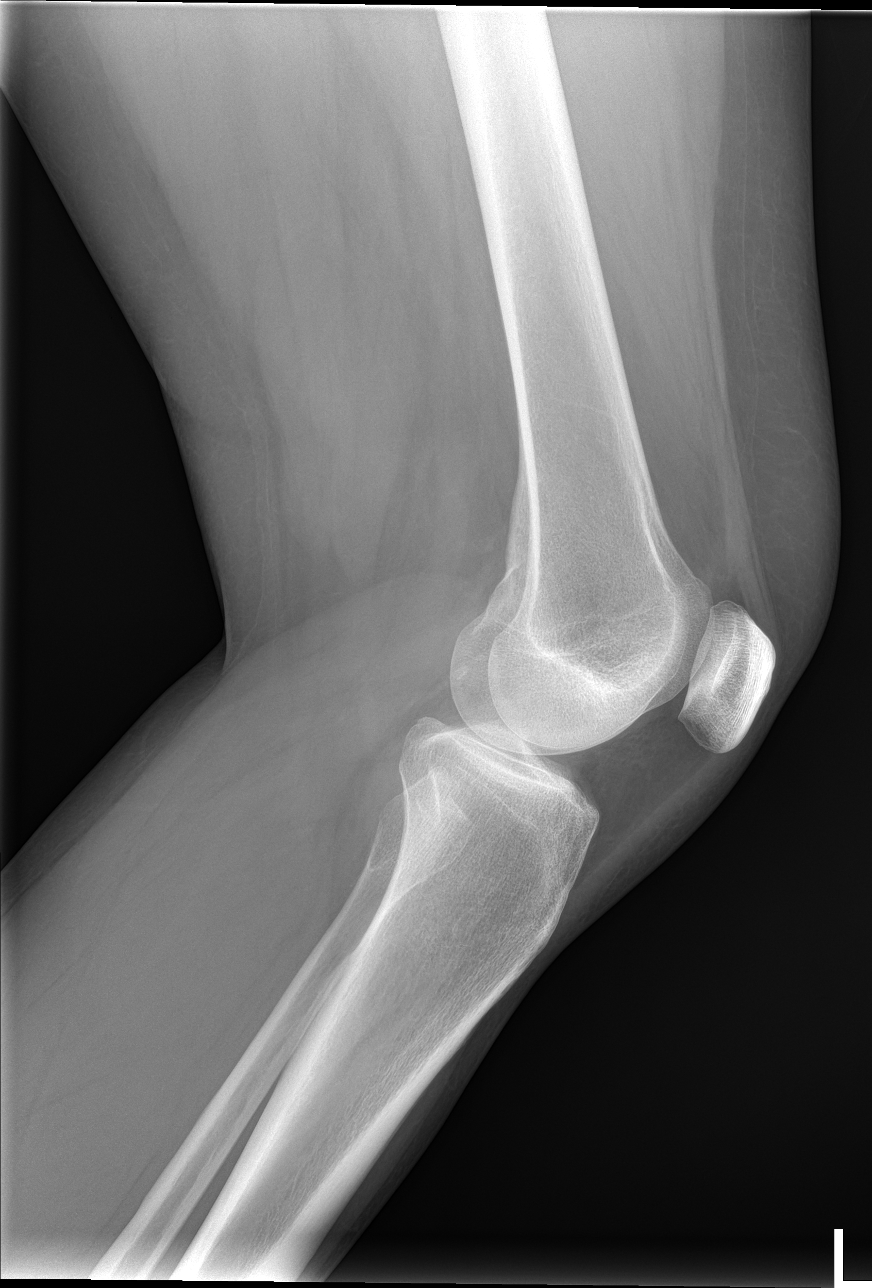

[knee obl (1 of 2)]
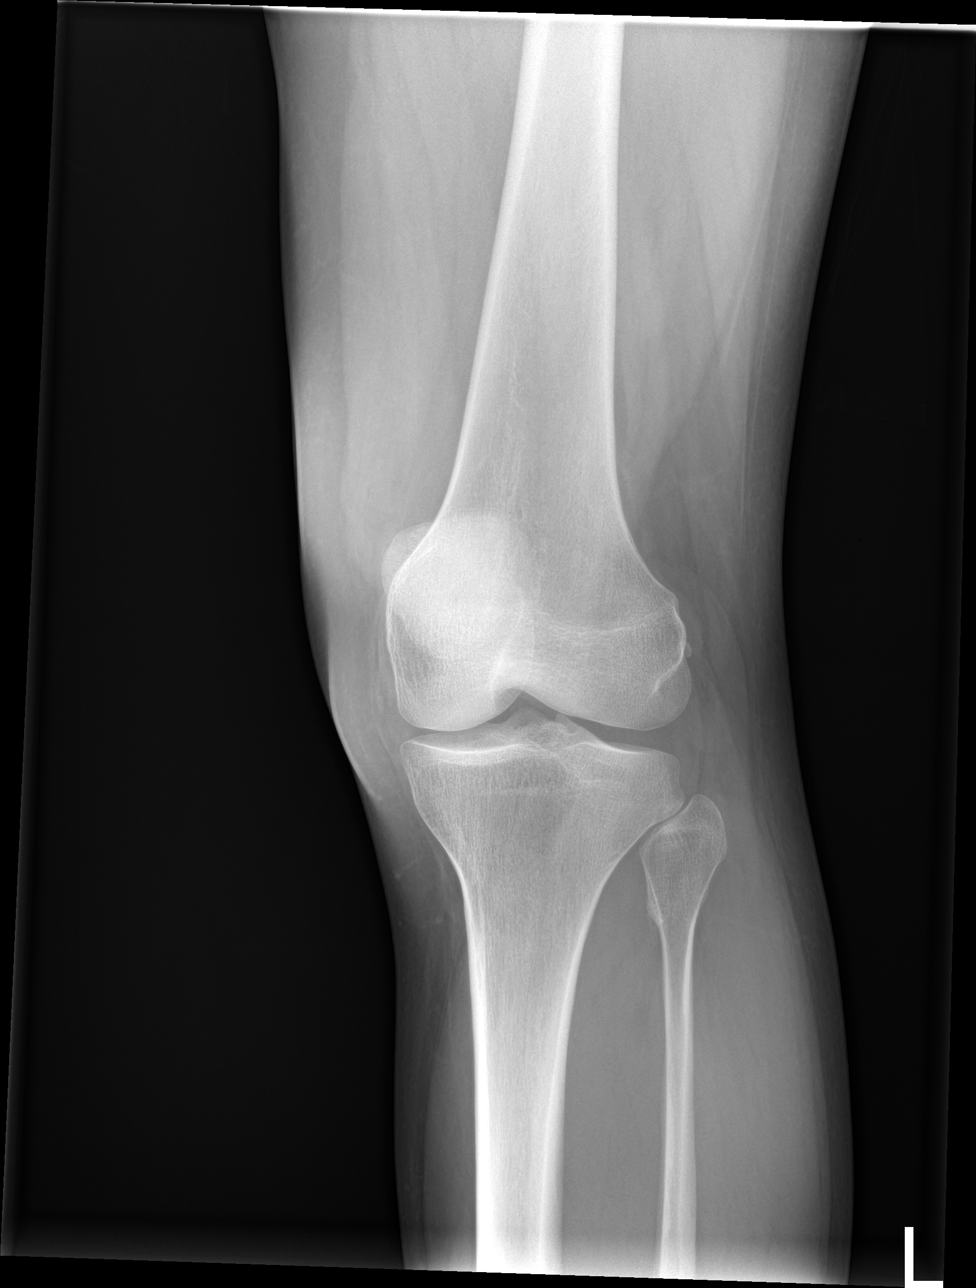

[knee obl (2 of 2)]
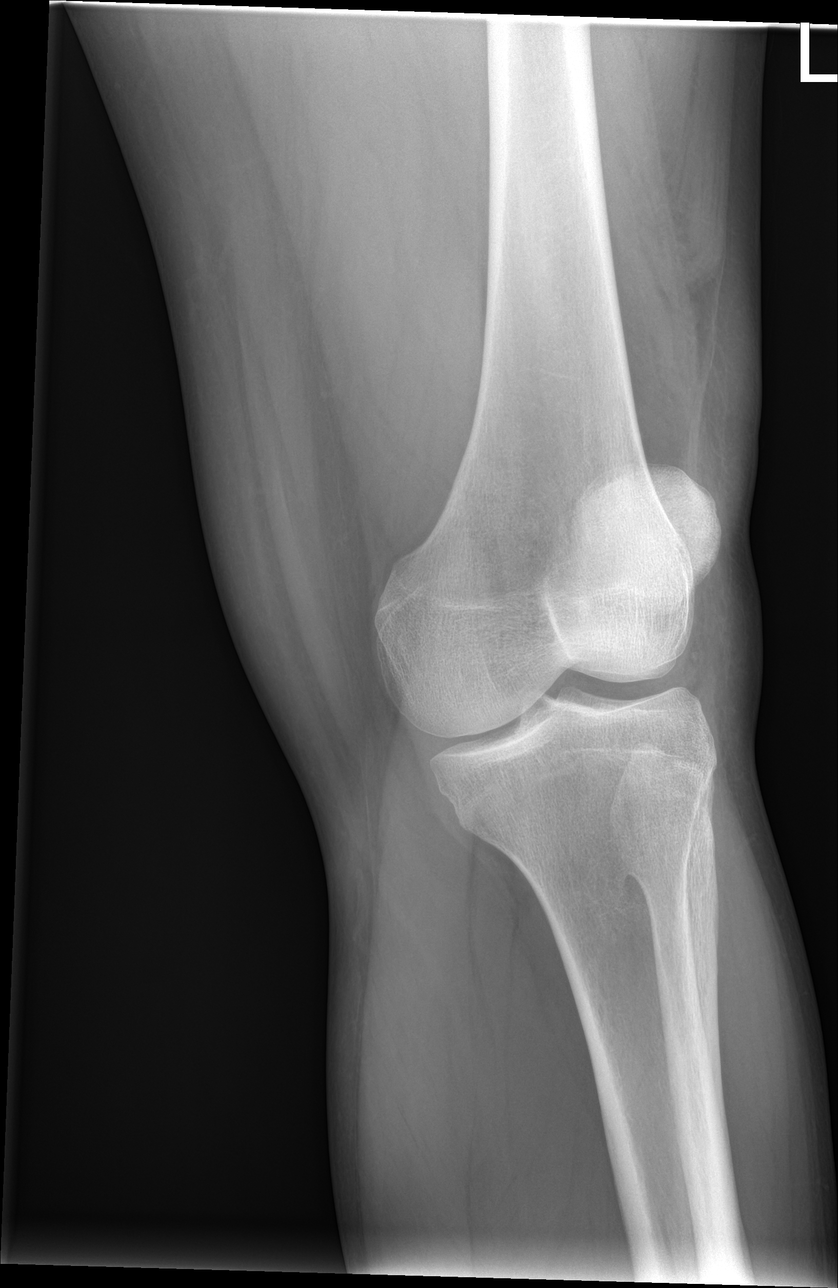

[4 of 4 positions shown; findings below may reference images not displayed]

FINDINGS: No evidence of fracture, dislocation, or joint effusion. No evidence
of arthropathy or other focal bone abnormality. Soft tissues are
unremarkable.
IMPRESSION: Negative.

## 2023-02-15 ENCOUNTER — Ambulatory Visit (HOSPITAL_COMMUNITY)
Admission: EM | Admit: 2023-02-15 | Discharge: 2023-02-15 | Disposition: A | Discharge status: Home / Self Care | Payer: MEDICAID | Attending: Nurse Practitioner | Admitting: Nurse Practitioner

## 2023-02-15 DIAGNOSIS — F411 Generalized anxiety disorder: Secondary | ICD-10-CM | POA: Insufficient documentation

## 2023-02-15 DIAGNOSIS — Z5900 Homelessness unspecified: Secondary | ICD-10-CM | POA: Insufficient documentation
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
## 2023-02-15 NOTE — Discharge Instructions (Signed)

## 2023-02-15 NOTE — ED Provider Notes (Incomplete)
Behavioral Health Urgent Care Medical Screening Exam  Patient Name: Marie Mckenzie MRN: [id number] Date of Evaluation: 02/15/23 Chief Complaint:  I want to get my meds Diagnosis:  Final diagnoses:  GAD (generalized anxiety disorder)    History of Present illness: Marie Mckenzie is a 32 y.o. female presents to [facility] due to not being able to afford her medications.   Nurse practitioner assessed patient and reviewed her chart. Patient is alert oriented x4    Flowsheet Row ED from 02/15/2023 in [facility] ED from 07/12/2020 in [facility] at [facility]  ED from 04/01/2020 in [facility] at [facility]   C-SSRS RISK CATEGORY Moderate Risk No Risk No Risk       Psychiatric Specialty Exam  Presentation  General Appearance:Disheveled  Eye Contact:Fleeting  Speech:Clear and Coherent  Speech Volume:Normal  Handedness:Right   Mood and Affect  Mood: Euthymic  Affect: Congruent   Thought Process  Thought Processes: Coherent  Descriptions of Associations:Intact  Orientation:Full (Time, Place and Person)  Thought Content:WDL    Hallucinations:None  Ideas of Reference:None  Suicidal Thoughts:No  Homicidal Thoughts:No   Sensorium  Memory: Immediate Fair; Recent Fair; Remote Fair  Judgment: Fair  Insight: Fair   Art therapist  Concentration: Fair  Attention Span: Fair  Recall: [employer] of Knowledge: Fair  Language: Fair   Psychomotor Activity  Psychomotor Activity: Normal   Assets  Assets: Manufacturing systems engineer; Physical Health; Resilience   Sleep  Sleep: Poor  Number of hours:  3   Physical Exam: Physical Exam Review of Systems  Constitutional: Negative.   HENT: Negative.    Eyes: Negative.   Respiratory: Negative.    Cardiovascular: Negative.   Gastrointestinal: Negative.   Genitourinary: Negative.   Musculoskeletal: Negative.   Skin: Negative.    Neurological: Negative.   Endo/Heme/Allergies: Negative.   Psychiatric/Behavioral:  The patient is nervous/anxious.    Blood pressure 128/86, pulse 92, temperature 98.1 F (36.7 C), temperature source Oral, resp. rate 16, SpO2 97%. There is no height or weight on file to calculate BMI.  Musculoskeletal: Strength & Muscle Tone: within normal limits Gait & Station: normal Patient leans: N/A   [facility] MSE Discharge Disposition for Follow up and Recommendations: Based on my evaluation the patient does not appear to have an emergency medical condition and can be discharged with resources and follow up care in outpatient services for Medication Management and Individual Therapy   Jasper Riling, NP 02/15/2023, 11:16 PM

## 2023-02-15 NOTE — ED Provider Notes (Signed)
Behavioral Health Urgent Care Medical Screening Exam  Patient Name: Marie Mckenzie MRN: [id number] Date of Evaluation: 02/15/23 Chief Complaint:  I want to get my meds Diagnosis:  Final diagnoses:  GAD (generalized anxiety disorder)    History of Present illness: Marie Mckenzie is a 32 y.o. female with a history of GAD and ADHD presenting voluntarily to [facility] due to not being able to afford her medications.   Nurse practitioner assessed patient and reviewed her chart. Patient is alert oriented x4, calm but guarded, speech is clear and coherent, mood is euthymic with congruent affect. Patient denies not appear to be responding to any internal or external stimuli or be experiencing any delusions, mania or paranoia. Patient denies any SI/HI or AVH at this time.  Patient arrived to the facility via uber. Patient reports that she is receiving mental health services from [facility] from [name]. Patient reports that she is prescribed adderall 30 mg tid, gabapentin 100 mg tid, ativan 1 mg tid, seroquel 300 mg at bedtime and lexapro 30 mg every day. Patient reports that she has been off her medications for about two weeks. Patient reports that her medications are at the pharmacy, but she did not have a way to pick them up. Patient reports that she has been couch surfing with friends. Patient reports that she has been homeless for about 5 years. Patient reports she drinks about 1/2 gallon of alcohol every few days.   Patient does not meet criteria for inpatient treatment and will be discharged with community resources for medication management and individual therapy.   Flowsheet Row ED from 02/15/2023 in [facility] ED from 07/12/2020 in [facility] at [facility]  ED from 04/01/2020 in [facility] at [place]   C-SSRS RISK CATEGORY Moderate Risk No Risk No Risk       Psychiatric Specialty Exam  Presentation  General  Appearance:Disheveled  Eye Contact:Fleeting  Speech:Clear and Coherent  Speech Volume:Normal  Handedness:Right   Mood and Affect  Mood: Euthymic  Affect: Congruent   Thought Process  Thought Processes: Coherent  Descriptions of Associations:Intact  Orientation:Full (Time, Place and Person)  Thought Content:WDL    Hallucinations:None  Ideas of Reference:None  Suicidal Thoughts:No  Homicidal Thoughts:No   Sensorium  Memory: Immediate Fair; Recent Fair; Remote Fair  Judgment: Fair  Insight: Fair   Art therapist  Concentration: Fair  Attention Span: Fair  Recall: [employer] of Knowledge: Fair  Language: Fair   Psychomotor Activity  Psychomotor Activity: Normal   Assets  Assets: Manufacturing systems engineer; Physical Health; Resilience   Sleep  Sleep: Poor  Number of hours:  3   Physical Exam: Physical Exam HENT:     Head: Normocephalic.     Nose: Nose normal.  Eyes:     Pupils: Pupils are equal, round, and reactive to light.  Cardiovascular:     Rate and Rhythm: Normal rate.  Abdominal:     General: Abdomen is flat.  Musculoskeletal:        General: Normal range of motion.  Skin:    General: Skin is warm.  Neurological:     Mental Status: She is alert and oriented to person, place, and time.  Psychiatric:        Attention and Perception: Attention normal.        Speech: Speech normal.        Cognition and Memory: Cognition normal.  Judgment: Judgment is impulsive.    Review of Systems  Constitutional: Negative.   HENT: Negative.    Eyes: Negative.   Respiratory: Negative.    Cardiovascular: Negative.   Gastrointestinal: Negative.   Genitourinary: Negative.   Musculoskeletal: Negative.   Skin: Negative.   Neurological: Negative.   Endo/Heme/Allergies: Negative.   Psychiatric/Behavioral:  The patient is nervous/anxious.    Blood pressure 128/86, pulse 92, temperature 98.1 F (36.7 C), temperature  source Oral, resp. rate 16, SpO2 97%. There is no height or weight on file to calculate BMI.  Musculoskeletal: Strength & Muscle Tone: within normal limits Gait & Station: normal Patient leans: N/A   [facility] MSE Discharge Disposition for Follow up and Recommendations: Based on my evaluation the patient does not appear to have an emergency medical condition and can be discharged with resources and follow up care in outpatient services for Medication Management and Individual Therapy   Jasper Riling, NP 02/15/2023, 11:16 PM

## 2023-03-12 LAB — HM HEPATITIS C SCREENING LAB: HM Hepatitis Screen: NEGATIVE

## 2023-03-17 LAB — HM HEPATITIS C SCREENING LAB: HM Hepatitis Screen: NEGATIVE

## 2023-11-24 ENCOUNTER — Ambulatory Visit: Admitting: Family Medicine

## 2023-11-27 ENCOUNTER — Ambulatory Visit: Admitting: Family Medicine

## 2024-01-12 ENCOUNTER — Ambulatory Visit (INDEPENDENT_AMBULATORY_CARE_PROVIDER_SITE_OTHER): Payer: MEDICAID | Admitting: Family Medicine

## 2024-01-12 ENCOUNTER — Encounter: Payer: Self-pay | Admitting: Family Medicine

## 2024-01-12 VITALS — BP 127/90 | HR 120 | Temp 98.7°F | Ht 66.0 in | Wt 182.1 lb

## 2024-01-12 DIAGNOSIS — F411 Generalized anxiety disorder: Secondary | ICD-10-CM

## 2024-01-12 DIAGNOSIS — R7401 Elevation of levels of liver transaminase levels: Secondary | ICD-10-CM

## 2024-01-12 DIAGNOSIS — D7589 Other specified diseases of blood and blood-forming organs: Secondary | ICD-10-CM

## 2024-01-12 DIAGNOSIS — E162 Hypoglycemia, unspecified: Secondary | ICD-10-CM

## 2024-01-12 DIAGNOSIS — F410 Panic disorder [episodic paroxysmal anxiety] without agoraphobia: Secondary | ICD-10-CM

## 2024-01-12 DIAGNOSIS — E041 Nontoxic single thyroid nodule: Secondary | ICD-10-CM

## 2024-01-12 DIAGNOSIS — Z0000 Encounter for general adult medical examination without abnormal findings: Secondary | ICD-10-CM

## 2024-01-12 DIAGNOSIS — F1010 Alcohol abuse, uncomplicated: Secondary | ICD-10-CM

## 2024-01-12 DIAGNOSIS — Z23 Encounter for immunization: Secondary | ICD-10-CM | POA: Diagnosis not present

## 2024-01-12 DIAGNOSIS — Z0001 Encounter for general adult medical examination with abnormal findings: Secondary | ICD-10-CM

## 2024-01-12 DIAGNOSIS — E559 Vitamin D deficiency, unspecified: Secondary | ICD-10-CM

## 2024-01-12 DIAGNOSIS — Z136 Encounter for screening for cardiovascular disorders: Secondary | ICD-10-CM

## 2024-01-12 DIAGNOSIS — K760 Fatty (change of) liver, not elsewhere classified: Secondary | ICD-10-CM

## 2024-01-12 NOTE — Progress Notes (Signed)
 New patient visit   Patient: Marie Mckenzie   DOB: 19-Aug-1990   33 y.o. Female  MRN: [id number] Visit Date: 01/12/2024  Today's healthcare provider: LAURAINE LOISE BUOY, DO   Chief Complaint  Patient presents with   New Patient (Initial Visit)    Patient is here to establish care with a primary provider.  Reports that a doctor told her that she has a mass on her lymph node and she is concerned about it.  Tdap-yes Flu- declined  Hepatitis C Screening-yes   Subjective    Marie Mckenzie is a 33 y.o. female who presents today as a new patient to establish care.  HPI HPI     New Patient (Initial Visit)    Additional comments: Patient is here to establish care with a primary provider.  Reports that a doctor told her that she has a mass on her lymph node and she is concerned about it.  Tdap-yes Flu- declined  Hepatitis C Screening-yes      Last edited by Toombs, Heather  L, CMA on 01/12/2024  2:14 PM.       Marie Mckenzie is a 33 year old female who presents for establishment of care and evaluation of a thyroid nodule.  A thyroid nodule measuring 1.7 cm was incidentally discovered on the right thyroid during a head scan. She has not had recent blood work related to this finding.  She experiences frequent panic attacks, approximately three to four times a week, characterized by feelings of breathlessness. She takes medication to manage these symptoms, which helps her calm down and breathe normally.  Her current medications include Adderall 30 mg twice a day, Vraylar, Lexapro, Ativan  as needed, and mirtazapine. She occasionally takes ibuprofen .  She has a history of vitamin D deficiency and does not take any vitamin D supplementation. She describes herself as a 'recluse,' which may contribute to her deficiency.  She drinks alcohol occasionally, about twice every two weeks, and has a past history of suicidal ideation and alcohol overdose.  She denies current  SI/HI.  No abnormal racing heartbeat outside of panic attacks.  She has a history of low hemoglobin A1c and has not noticed any symptoms related to this.    In Care Everywhere : 03/11/2023 CT spine cervical without contrast: 1.7 cm hypoattenuating structure adjacent to the right thyroid lobe  03/12/2023 hepatitis C antibody screening non-reactive     Past Medical History:  Diagnosis Date   ADHD (attention deficit hyperactivity disorder)    Anxiety    Hypertension    Major depressive disorder    Past Surgical History:  Procedure Laterality Date   TONSILLECTOMY     WISDOM TOOTH EXTRACTION     Family Status  Relation Name Status   Mother  Alive   Father  Deceased   Brother  (Not Specified)   Bruna Brigham  (Not Specified)  No partnership data on file   Family History  Problem Relation Age of Onset   Other Mother        unknown medical hsitory   Hypertension Father    Other Father        smoke inhalation   Asperger's syndrome Brother    Schizophrenia Paternal Uncle    Social History   Socioeconomic History   Marital status: Single    Spouse name: Not on file   Number of children: Not on file   Years of education: Not on file   Highest education level: Not on  file  Occupational History   Not on file  Tobacco Use   Smoking status: Former    Current packs/day: 0.00    Average packs/day: 1 pack/day for 15.0 years (15.0 ttl pk-yrs)    Types: Cigarettes    Quit date: 04/10/2023    Years since quitting: 0.7   Smokeless tobacco: Never  Vaping Use   Vaping status: Every Day  Substance and Sexual Activity   Alcohol use: Not Currently    Alcohol/week: 2.0 standard drinks of alcohol    Types: 2 Shots of liquor per week    Comment: Hx ETOH   Drug use: No   Sexual activity: Yes    Birth control/protection: Condom  Other Topics Concern   Not on file  Social History Narrative   Not on file   Social Drivers of Health   Financial Resource Strain: High Risk (05/25/2022)    Received from [facility]   Overall Financial Resource Strain (CARDIA)    Difficulty of Paying Living Expenses: Very hard  Food Insecurity: Food Insecurity Present (02/17/2023)   Received from [facility]   Hunger Vital Sign    Within the past 12 months, you worried that your food would run out before you got the money to buy more.: Sometimes true    Within the past 12 months, the food you bought just didn't last and you didn't have money to get more.: Sometimes true  Transportation Needs: Unmet Transportation Needs (02/27/2023)   Received from [facility] - Transportation    Lack of Transportation (Medical): Yes    Lack of Transportation (Non-Medical): No  Physical Activity: Insufficiently Active (05/25/2022)   Received from [facility]   Exercise Vital Sign    On average, how many days per week do you engage in moderate to strenuous exercise (like a brisk walk)?: 3 days    On average, how many minutes do you engage in exercise at this level?: 30 min  Stress: Stress Concern Present (02/17/2023)   Received from [facility] - Occupational Stress Questionnaire    Feeling of Stress : Very much  Social Connections: Moderately Integrated (05/25/2022)   Received from [facility]   Social Connection and Isolation Panel    In a typical week, how many times do you talk on the phone with family, friends, or neighbors?: More than three times a week    How often do you get together with friends or relatives?: More than three times a week    How often do you attend church or religious services?: 1 to 4 times per year    Do you belong to any clubs or organizations such as church groups, unions, fraternal or athletic groups, or school groups?: No    How often do you attend meetings of the clubs or organizations you belong to?: 1 to 4 times per year    Are you married, widowed, divorced, separated, never married, or living with a  partner?: Never married   Outpatient Medications Prior to Visit  Medication Sig   amphetamine -dextroamphetamine  (ADDERALL) 30 MG tablet TAKE 1 TABLET BY MOUTH EVERY MORNING HALF TABLET AT MIDDAY, AND 1 TABLET EVERY AFTERNOON (Patient taking differently: Take 30 mg by mouth 2 (two) times daily.)   cariprazine (VRAYLAR) 3 MG capsule Take 3 mg by mouth daily.   escitalopram (LEXAPRO) 20 MG tablet Take 20 mg by mouth daily.   LORazepam  (ATIVAN ) 1 MG tablet  Take 1 mg by mouth 3 (three) times daily as needed for anxiety.   mirtazapine (REMERON) 7.5 MG tablet Take 7.5 mg by mouth at bedtime.   [DISCONTINUED] benzonatate  (TESSALON ) 100 MG capsule Take 2 capsules (200 mg total) by mouth 3 (three) times daily as needed for cough.   [DISCONTINUED] fluticasone  (FLONASE ) 50 MCG/ACT nasal spray Place 2 sprays into both nostrils daily.   [DISCONTINUED] norethindrone  (MICRONOR ) 0.35 MG tablet Take 1 tablet (0.35 mg total) by mouth daily.   [DISCONTINUED] QUEtiapine  (SEROQUEL ) 100 MG tablet Take 1 tablet (100 mg total) by mouth at bedtime.   No facility-administered medications prior to visit.   No Known Allergies  Immunization History  Administered Date(s) Administered   DTP 06/23/1989, 01/13/1990, 07/06/1990, 12/12/1992   H1N1 04/07/2008   HPV Quadrivalent 04/07/2006, 06/19/2006, 03/25/2007   Hep B, Unspecified 01/01/2001, 02/15/2001, 06/04/2001   Hepatitis B, PED/ADOLESCENT 03/23/2006   MMR 07/06/1990, 12/12/1992   OPV 06/23/1989, 01/13/1990, 07/06/1990, 12/12/1992   Tdap 03/21/2005, 10/23/2016, 01/12/2024    Health Maintenance  Topic Date Due   Influenza Vaccine  06/07/2024 (Originally 10/09/2023)   COVID-19 Vaccine (1 - 2025-26 season) 11/08/2024 (Originally 11/09/2023)   Cervical Cancer Screening (HPV/Pap Cotest)  06/06/2024   DTaP/Tdap/Td (8 - Td or Tdap) 01/11/2034   HPV VACCINES  Completed   Hepatitis C Screening  Completed   HIV Screening  Completed   Pneumococcal Vaccine  Aged Out    Meningococcal B Vaccine  Aged Out   Hepatitis B Vaccines 19-59 Average Risk  Discontinued    Patient Care Team: Donzella Lauraine SAILOR, DO as PCP - General (Family Medicine)  Review of Systems  Constitutional:  Negative for chills, fatigue and fever.  HENT:  Negative for congestion, ear pain, rhinorrhea, sneezing and sore throat.   Eyes: Negative.  Negative for pain and redness.  Respiratory:  Negative for cough, shortness of breath and wheezing.   Cardiovascular:  Negative for chest pain, palpitations and leg swelling.  Gastrointestinal:  Negative for abdominal pain, blood in stool, constipation, diarrhea and nausea.  Endocrine: Negative for polydipsia and polyphagia.  Genitourinary: Negative.  Negative for dysuria, flank pain, hematuria, pelvic pain, vaginal bleeding and vaginal discharge.  Musculoskeletal:  Negative for arthralgias, back pain, gait problem and joint swelling.  Skin:  Negative for rash.  Neurological: Negative.  Negative for dizziness, tremors, seizures, weakness, light-headedness, numbness and headaches.  Hematological:  Negative for adenopathy.  Psychiatric/Behavioral:  Positive for decreased concentration. Negative for behavioral problems, confusion and dysphoric mood. The patient is nervous/anxious. The patient is not hyperactive.         Objective    BP (!) 127/90 (BP Location: Right Arm, Patient Position: Sitting, Cuff Size: Normal)   Pulse (!) 120   Temp 98.7 F (37.1 C) (Oral)   Ht 5' 6 (1.676 m)   Wt 182 lb 1.6 oz (82.6 kg)   SpO2 99%   BMI 29.39 kg/m     Physical Exam Vitals and nursing note reviewed.  Constitutional:      General: She is awake.     Appearance: Normal appearance.  HENT:     Head: Normocephalic and atraumatic.     Right Ear: Tympanic membrane, ear canal and external ear normal.     Left Ear: Tympanic membrane, ear canal and external ear normal.     Nose: Nose normal.     Mouth/Throat:     Mouth: Mucous membranes are moist.      Pharynx: Oropharynx is clear. No  oropharyngeal exudate or posterior oropharyngeal erythema.  Eyes:     General: No scleral icterus.    Extraocular Movements: Extraocular movements intact.     Conjunctiva/sclera: Conjunctivae normal.     Pupils: Pupils are equal, round, and reactive to light.  Neck:     Thyroid: No thyromegaly or thyroid tenderness.  Cardiovascular:     Rate and Rhythm: Normal rate and regular rhythm.     Pulses: Normal pulses.     Heart sounds: Normal heart sounds.  Pulmonary:     Effort: Pulmonary effort is normal. No tachypnea, bradypnea or respiratory distress.     Breath sounds: Normal breath sounds. No stridor. No wheezing, rhonchi or rales.  Abdominal:     General: Bowel sounds are normal. There is no distension.     Palpations: Abdomen is soft. There is no mass.     Tenderness: There is no abdominal tenderness. There is no guarding.     Hernia: No hernia is present.  Musculoskeletal:     Cervical back: Normal range of motion and neck supple.     Right lower leg: No edema.     Left lower leg: No edema.  Lymphadenopathy:     Cervical: No cervical adenopathy.  Skin:    General: Skin is warm and dry.  Neurological:     Mental Status: She is alert and oriented to person, place, and time. Mental status is at baseline.  Psychiatric:        Mood and Affect: Mood normal.        Behavior: Behavior normal.     Depression Screen    01/12/2024    2:24 PM  PHQ 2/9 Scores  PHQ - 2 Score 3  PHQ- 9 Score 9      Data saved with a previous flowsheet row definition   Results for orders placed or performed in visit on 01/12/24  TSH+T4F+T3Free  Result Value Ref Range   TSH 1.630 0.450 - 4.500 uIU/mL   T3, Free 3.0 2.0 - 4.4 pg/mL   Free T4 1.08 0.82 - 1.77 ng/dL  CBC  Result Value Ref Range   WBC 7.8 3.4 - 10.8 x10E3/uL   RBC 4.22 3.77 - 5.28 x10E6/uL   Hemoglobin 14.7 11.1 - 15.9 g/dL   Hematocrit 58.3 65.9 - 46.6 %   MCV 99 (H) 79 - 97 fL   MCH 34.8 (H)  26.6 - 33.0 pg   MCHC 35.3 31.5 - 35.7 g/dL   RDW 88.0 88.2 - 84.5 %   Platelets 268 150 - 450 x10E3/uL  Comprehensive metabolic panel with GFR  Result Value Ref Range   Glucose 88 70 - 99 mg/dL   BUN 6 6 - 20 mg/dL   Creatinine, Ser 9.21 0.57 - 1.00 mg/dL   eGFR 897 >40 fO/fpw/8.26   BUN/Creatinine Ratio 8 (L) 9 - 23   Sodium 142 134 - 144 mmol/L   Potassium 4.4 3.5 - 5.2 mmol/L   Chloride 102 96 - 106 mmol/L   CO2 23 20 - 29 mmol/L   Calcium 10.2 8.7 - 10.2 mg/dL   Total Protein 7.7 6.0 - 8.5 g/dL   Albumin 5.0 (H) 3.9 - 4.9 g/dL   Globulin, Total 2.7 1.5 - 4.5 g/dL   Bilirubin Total 0.8 0.0 - 1.2 mg/dL   Alkaline Phosphatase 78 41 - 116 IU/L   AST 25 0 - 40 IU/L   ALT 16 0 - 32 IU/L  Lipid panel  Result Value Ref Range  Cholesterol, Total 194 100 - 199 mg/dL   Triglycerides 896 0 - 149 mg/dL   HDL 55 >60 mg/dL   VLDL Cholesterol Cal 19 5 - 40 mg/dL   LDL Chol Calc (NIH) 879 (H) 0 - 99 mg/dL   Chol/HDL Ratio 3.5 0.0 - 4.4 ratio  VITAMIN D 25 Hydroxy (Vit-D Deficiency, Fractures)  Result Value Ref Range   Vit D, 25-Hydroxy 18.0 (L) 30.0 - 100.0 ng/mL  Parathyroid hormone, intact (no Ca)  Result Value Ref Range   PTH 29 15 - 65 pg/mL  HM HEPATITIS C SCREENING LAB  Result Value Ref Range   HM Hepatitis Screen Negative-Validated   HM HEPATITIS C SCREENING LAB  Result Value Ref Range   HM Hepatitis Screen Negative-Validated     Assessment & Plan     Encounter for medical examination to establish care  Thyroid nodule greater than or equal to 1.5 cm in diameter incidentally noted on imaging study -     TSH+T4F+T3Free -     Comprehensive metabolic panel with GFR -     Parathyroid hormone, intact (no Ca) -     US  THYROID; Future  Vitamin D deficiency -     VITAMIN D 25 Hydroxy (Vit-D Deficiency, Fractures)  Generalized anxiety disorder with panic attacks  Elevated transaminase level  Hepatic steatosis  Mild alcohol use disorder  Hypoglycemia  Need for Tdap  vaccination -     Tdap vaccine greater than or equal to 7yo IM  Macrocytosis -     CBC  Encounter for screening for cardiovascular disorders -     Lipid panel     Encounter for medical examination to establish care Routine visit with slightly elevated blood pressure and pulse due to anxiety.  Physical exam overall unremarkable except as noted above. Routine lab work ordered as noted.  History of suicidal ideation and alcohol overdose attempt, but symptoms are manageable. Low hemoglobin A1c possibly related to anxiety. - Ordered blood work including cholesterol and liver function tests. - Provided sample glucose meter for monitoring blood sugar levels.  Thyroid nodule greater than or equal to 1.5 cm in diameter incidentally noted on imaging study 1.7 cm nodule noted in right thyroid lobe.  Will evaluate further with thyroid ultrasound. - Ordered dedicated thyroid ultrasound.  Vitamin D deficiency Likely due to limited sun exposure. No current supplementation. - Will recheck vitamin D levels. - Will initiate high-dose vitamin D supplementation if indicated.  Generalized anxiety with panic attack Chronic, managed by behavioral health.  Frequent panic attacks managed with escitalopram 20 mg daily, Vraylar 3 mg daily, mirtazapine 7.5 mg, and lorazepam  1 mg 3 times daily as needed.  Defer to specialist management. - Continue current medication regimen. - Continue to follow with behavioral health.  Defer to specialist management.  Elevated transaminases level; hepatic steatosis; mild alcohol use disorder Consistently elevated AST, with occasional elevation of ALT. No symptoms suggestive of liver disease.  Evidence of steatosis noted on CTA chest done 04/14/2016.  Previous CTA chest 06/08/2015 showed severe hepatic steatosis. Recommend minimizing alcohol use. - Ordered liver function tests.  Hypoglycemia (low hemoglobin A1c) Concern for possible hypoglycemia due to low hemoglobin A1c which  may be reflected in episodes of anxiety. No definitive hypoglycemia diagnosis. - Provided sample glucose meter for monitoring blood sugar levels. - Instructed to report any low blood sugar readings.  Immunization management Declined flu and COVID vaccinations. Agreed to tetanus vaccine. Hepatitis C screening nonreactive. - Administered tetanus vaccine. - Pulled  hepatitis C screening record from Care Everywhere.     Return in about 6 weeks (around 02/23/2024) for Chronic f/u, BG.     I discussed the assessment and treatment plan with the patient  The patient was provided an opportunity to ask questions and all were answered. The patient agreed with the plan and demonstrated an understanding of the instructions.   The patient was advised to call back or seek an in-person evaluation if the symptoms worsen or if the condition fails to improve as anticipated.    LAURAINE LOISE BUOY, DO  [facility] [phone] (phone) [phone] (fax)  [facility]

## 2024-01-14 LAB — COMPREHENSIVE METABOLIC PANEL WITH GFR
ALT: 16 IU/L (ref 0–32)
AST: 25 IU/L (ref 0–40)
Albumin: 5 g/dL — ABNORMAL HIGH (ref 3.9–4.9)
Alkaline Phosphatase: 78 IU/L (ref 41–116)
BUN/Creatinine Ratio: 8 — ABNORMAL LOW (ref 9–23)
BUN: 6 mg/dL (ref 6–20)
Bilirubin Total: 0.8 mg/dL (ref 0.0–1.2)
CO2: 23 mmol/L (ref 20–29)
Calcium: 10.2 mg/dL (ref 8.7–10.2)
Chloride: 102 mmol/L (ref 96–106)
Creatinine, Ser: 0.78 mg/dL (ref 0.57–1.00)
Globulin, Total: 2.7 g/dL (ref 1.5–4.5)
Glucose: 88 mg/dL (ref 70–99)
Potassium: 4.4 mmol/L (ref 3.5–5.2)
Sodium: 142 mmol/L (ref 134–144)
Total Protein: 7.7 g/dL (ref 6.0–8.5)
eGFR: 102 mL/min/1.73 (ref >59)

## 2024-01-14 LAB — CBC
Hematocrit: 41.6 % (ref 34.0–46.6)
Hemoglobin: 14.7 g/dL (ref 11.1–15.9)
MCH: 34.8 pg — ABNORMAL HIGH (ref 26.6–33.0)
MCHC: 35.3 g/dL (ref 31.5–35.7)
MCV: 99 fL — ABNORMAL HIGH (ref 79–97)
Platelets: 268 x10E3/uL (ref 150–450)
RBC: 4.22 x10E6/uL (ref 3.77–5.28)
RDW: 11.9 % (ref 11.7–15.4)
WBC: 7.8 x10E3/uL (ref 3.4–10.8)

## 2024-01-14 LAB — LIPID PANEL
Chol/HDL Ratio: 3.5 ratio (ref 0.0–4.4)
Cholesterol, Total: 194 mg/dL (ref 100–199)
HDL: 55 mg/dL (ref >39)
LDL Chol Calc (NIH): 120 mg/dL — ABNORMAL HIGH (ref 0–99)
Triglycerides: 103 mg/dL (ref 0–149)
VLDL Cholesterol Cal: 19 mg/dL (ref 5–40)

## 2024-01-14 LAB — VITAMIN D 25 HYDROXY (VIT D DEFICIENCY, FRACTURES): Vit D, 25-Hydroxy: 18 ng/mL — ABNORMAL LOW (ref 30.0–100.0)

## 2024-01-14 LAB — TSH+T4F+T3FREE
Free T4: 1.08 ng/dL (ref 0.82–1.77)
T3, Free: 3 pg/mL (ref 2.0–4.4)
TSH: 1.63 u[IU]/mL (ref 0.450–4.500)

## 2024-01-14 LAB — PARATHYROID HORMONE, INTACT (NO CA): PTH: 29 pg/mL (ref 15–65)

## 2024-01-18 ENCOUNTER — Encounter: Payer: Self-pay | Admitting: Family Medicine

## 2024-01-20 ENCOUNTER — Ambulatory Visit: Payer: Self-pay | Admitting: Family Medicine

## 2024-01-20 DIAGNOSIS — E559 Vitamin D deficiency, unspecified: Secondary | ICD-10-CM

## 2024-01-20 MED ORDER — VITAMIN D (ERGOCALCIFEROL) 1.25 MG (50000 UNIT) PO CAPS: 50000.0000 [IU] | ORAL_CAPSULE | ORAL | 1 refills | Status: DC

## 2024-02-17 ENCOUNTER — Ambulatory Visit: Payer: MEDICAID

## 2024-02-23 ENCOUNTER — Ambulatory Visit: Payer: MEDICAID | Attending: Family Medicine

## 2024-03-24 ENCOUNTER — Other Ambulatory Visit: Payer: Self-pay

## 2024-03-24 ENCOUNTER — Emergency Department (HOSPITAL_COMMUNITY)
Admission: EM | Admit: 2024-03-24 | Discharge: 2024-03-25 | Disposition: A | Discharge status: Other Acute Inpatient Hospital | Payer: MEDICAID | Attending: Emergency Medicine | Admitting: Emergency Medicine

## 2024-03-24 ENCOUNTER — Emergency Department (HOSPITAL_COMMUNITY): Payer: MEDICAID

## 2024-03-24 ENCOUNTER — Encounter (HOSPITAL_COMMUNITY): Payer: Self-pay

## 2024-03-24 DIAGNOSIS — F1210 Cannabis abuse, uncomplicated: Secondary | ICD-10-CM | POA: Diagnosis not present

## 2024-03-24 DIAGNOSIS — S61411A Laceration without foreign body of right hand, initial encounter: Secondary | ICD-10-CM | POA: Insufficient documentation

## 2024-03-24 DIAGNOSIS — W25XXXA Contact with sharp glass, initial encounter: Secondary | ICD-10-CM | POA: Diagnosis not present

## 2024-03-24 DIAGNOSIS — F1310 Sedative, hypnotic or anxiolytic abuse, uncomplicated: Secondary | ICD-10-CM | POA: Insufficient documentation

## 2024-03-24 DIAGNOSIS — F4310 Post-traumatic stress disorder, unspecified: Secondary | ICD-10-CM | POA: Insufficient documentation

## 2024-03-24 DIAGNOSIS — Y906 Blood alcohol level of 120-199 mg/100 ml: Secondary | ICD-10-CM | POA: Insufficient documentation

## 2024-03-24 DIAGNOSIS — R4789 Other speech disturbances: Secondary | ICD-10-CM | POA: Insufficient documentation

## 2024-03-24 DIAGNOSIS — E876 Hypokalemia: Secondary | ICD-10-CM | POA: Diagnosis not present

## 2024-03-24 DIAGNOSIS — F1020 Alcohol dependence, uncomplicated: Secondary | ICD-10-CM | POA: Diagnosis present

## 2024-03-24 DIAGNOSIS — S61419A Laceration without foreign body of unspecified hand, initial encounter: Secondary | ICD-10-CM

## 2024-03-24 DIAGNOSIS — F411 Generalized anxiety disorder: Secondary | ICD-10-CM | POA: Diagnosis present

## 2024-03-24 DIAGNOSIS — F419 Anxiety disorder, unspecified: Secondary | ICD-10-CM | POA: Insufficient documentation

## 2024-03-24 DIAGNOSIS — F1010 Alcohol abuse, uncomplicated: Secondary | ICD-10-CM | POA: Diagnosis not present

## 2024-03-24 DIAGNOSIS — R000 Tachycardia, unspecified: Secondary | ICD-10-CM | POA: Insufficient documentation

## 2024-03-24 DIAGNOSIS — R454 Irritability and anger: Secondary | ICD-10-CM | POA: Diagnosis not present

## 2024-03-24 DIAGNOSIS — R451 Restlessness and agitation: Secondary | ICD-10-CM | POA: Diagnosis not present

## 2024-03-24 DIAGNOSIS — F1510 Other stimulant abuse, uncomplicated: Secondary | ICD-10-CM | POA: Insufficient documentation

## 2024-03-24 DIAGNOSIS — F3112 Bipolar disorder, current episode manic without psychotic features, moderate: Secondary | ICD-10-CM | POA: Diagnosis present

## 2024-03-24 DIAGNOSIS — F410 Panic disorder [episodic paroxysmal anxiety] without agoraphobia: Secondary | ICD-10-CM | POA: Diagnosis present

## 2024-03-24 DIAGNOSIS — S61412A Laceration without foreign body of left hand, initial encounter: Secondary | ICD-10-CM | POA: Diagnosis not present

## 2024-03-24 DIAGNOSIS — R4587 Impulsiveness: Secondary | ICD-10-CM | POA: Insufficient documentation

## 2024-03-24 LAB — CBC WITH DIFFERENTIAL/PLATELET
Abs Immature Granulocytes: 0.01 K/uL (ref 0.00–0.07)
Basophils Absolute: 0.1 K/uL (ref 0.0–0.1)
Basophils Relative: 1 %
Eosinophils Absolute: 0.1 K/uL (ref 0.0–0.5)
Eosinophils Relative: 1 %
HCT: 41.6 % (ref 36.0–46.0)
Hemoglobin: 14.4 g/dL (ref 12.0–15.0)
Immature Granulocytes: 0 %
Lymphocytes Relative: 25 %
Lymphs Abs: 1.4 K/uL (ref 0.7–4.0)
MCH: 32.4 pg (ref 26.0–34.0)
MCHC: 34.6 g/dL (ref 30.0–36.0)
MCV: 93.5 fL (ref 80.0–100.0)
Monocytes Absolute: 1 K/uL (ref 0.1–1.0)
Monocytes Relative: 17 %
Neutro Abs: 3.2 K/uL (ref 1.7–7.7)
Neutrophils Relative %: 56 %
Platelets: 173 K/uL (ref 150–400)
RBC: 4.45 MIL/uL (ref 3.87–5.11)
RDW: 13.4 % (ref 11.5–15.5)
WBC: 5.7 K/uL (ref 4.0–10.5)
nRBC: 0 % (ref 0.0–0.2)

## 2024-03-24 LAB — URINALYSIS, ROUTINE W REFLEX MICROSCOPIC
Bacteria, UA: NONE SEEN
Bilirubin Urine: NEGATIVE
Glucose, UA: NEGATIVE mg/dL
Hgb urine dipstick: NEGATIVE
Ketones, ur: NEGATIVE mg/dL
Leukocytes,Ua: NEGATIVE
Nitrite: NEGATIVE
Protein, ur: 30 mg/dL — AB
Specific Gravity, Urine: 1.004 — ABNORMAL LOW (ref 1.005–1.030)
pH: 5 (ref 5.0–8.0)

## 2024-03-24 LAB — BASIC METABOLIC PANEL WITH GFR
Anion gap: 25 — ABNORMAL HIGH (ref 5–15)
BUN: <5 mg/dL — ABNORMAL LOW (ref 6–20)
CO2: 18 mmol/L — ABNORMAL LOW (ref 22–32)
Calcium: 10 mg/dL (ref 8.9–10.3)
Chloride: 99 mmol/L (ref 98–111)
Creatinine, Ser: 0.73 mg/dL (ref 0.44–1.00)
GFR, Estimated: >60 mL/min
Glucose, Bld: 136 mg/dL — ABNORMAL HIGH (ref 70–99)
Potassium: 3.1 mmol/L — ABNORMAL LOW (ref 3.5–5.1)
Sodium: 141 mmol/L (ref 135–145)

## 2024-03-24 LAB — ETHANOL: Alcohol, Ethyl (B): 127 mg/dL — ABNORMAL HIGH

## 2024-03-24 LAB — URINE DRUG SCREEN
Amphetamines: POSITIVE — AB
Barbiturates: NEGATIVE
Benzodiazepines: POSITIVE — AB
Cocaine: NEGATIVE
Fentanyl: NEGATIVE
Methadone Scn, Ur: NEGATIVE
Opiates: NEGATIVE
Tetrahydrocannabinol: POSITIVE — AB

## 2024-03-24 LAB — ACETAMINOPHEN LEVEL: Acetaminophen (Tylenol), Serum: <10 ug/mL — ABNORMAL LOW (ref 10–30)

## 2024-03-24 LAB — SALICYLATE LEVEL: Salicylate Lvl: <7 mg/dL — ABNORMAL LOW (ref 7.0–30.0)

## 2024-03-24 MED ORDER — LORAZEPAM 1 MG PO TABS
1.0000 mg | ORAL_TABLET | Freq: Once | ORAL | Status: AC
  Administered 2024-03-24: 1 mg via ORAL
  Filled 2024-03-24: qty 1

## 2024-03-24 MED ORDER — ONDANSETRON HCL 4 MG/2ML IJ SOLN
4.0000 mg | Freq: Once | INTRAMUSCULAR | Status: AC
  Administered 2024-03-24: 4 mg via INTRAVENOUS
  Filled 2024-03-24: qty 2

## 2024-03-24 MED ORDER — OLANZAPINE 10 MG PO TABS: 10.0000 mg | ORAL_TABLET | Freq: Four times a day (QID) | ORAL | Status: DC | PRN

## 2024-03-24 MED ORDER — QUETIAPINE FUMARATE 25 MG PO TABS
200.0000 mg | ORAL_TABLET | Freq: Every day | ORAL | Status: DC
  Administered 2024-03-24: 200 mg via ORAL
  Filled 2024-03-24: qty 8

## 2024-03-24 MED ORDER — LORAZEPAM 1 MG PO TABS
1.0000 mg | ORAL_TABLET | ORAL | Status: DC | PRN
  Administered 2024-03-24 – 2024-03-25 (×2): 1 mg via ORAL
  Filled 2024-03-24 (×2): qty 1

## 2024-03-24 MED ORDER — ONDANSETRON 4 MG PO TBDP
8.0000 mg | ORAL_TABLET | Freq: Once | ORAL | Status: DC
  Filled 2024-03-24: qty 2

## 2024-03-24 MED ORDER — ESCITALOPRAM OXALATE 10 MG PO TABS
20.0000 mg | ORAL_TABLET | Freq: Every day | ORAL | Status: DC
  Administered 2024-03-24 – 2024-03-25 (×2): 20 mg via ORAL
  Filled 2024-03-24 (×2): qty 2

## 2024-03-24 MED ORDER — SODIUM CHLORIDE 0.9 % IV BOLUS
1000.0000 mL | Freq: Once | INTRAVENOUS | Status: AC
  Administered 2024-03-24: 1000 mL via INTRAVENOUS

## 2024-03-24 MED ORDER — ZIPRASIDONE MESYLATE 20 MG IM SOLR
20.0000 mg | Freq: Once | INTRAMUSCULAR | Status: AC
  Administered 2024-03-24: 20 mg via INTRAMUSCULAR
  Filled 2024-03-24: qty 20

## 2024-03-24 MED ORDER — STERILE WATER FOR INJECTION IJ SOLN
INTRAMUSCULAR | Status: AC
  Filled 2024-03-24: qty 10

## 2024-03-24 MED ORDER — POTASSIUM CHLORIDE CRYS ER 20 MEQ PO TBCR
40.0000 meq | EXTENDED_RELEASE_TABLET | Freq: Once | ORAL | Status: AC
  Administered 2024-03-24: 40 meq via ORAL
  Filled 2024-03-24: qty 2

## 2024-03-24 NOTE — ED Triage Notes (Signed)
 Pt BIB [facility] from home d/t lacerations on left  5th finger and right 1st finger and on webs in between fingers on right side.  Pt pushed on a window and the glass broke.    BP 150/98 HR 70 RR 20 O2 96%  Cbg 132

## 2024-03-24 NOTE — BH Assessment (Signed)
 TTS Consult is being deferred to [facility] for this patient.  [facility] Coordinator will schedule the assessment time. Secure chat initiated with Care Team.

## 2024-03-24 NOTE — ED Notes (Signed)
 Pt dressed out into paper scrubs.
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
## 2024-03-24 NOTE — Progress Notes (Signed)
 Inpatient Psychiatric Referral  Patient was recommended inpatient per Sabo Uba, NP. There are no available beds at [facility], per [facility] . Patient was referred to the following out of network facilities:  Destination  Service Provider Address Phone Fax  [facility]  [address] [place] [address] [phone]  [facility]  [address] [place] [address] [phone]  [facility]  [address] [place] [address] [phone]  [facility]  [address], [place] [address] [phone]  [facility]  [address] [place] [address] [phone]  [facility] EFAX  [address] [place], [place] [phone]  [facility]  [address] [place] [address] [phone]  [facility]  [address] [place] [address] [phone]    Situation ongoing, CSW to continue following and update chart as more information becomes available.   Harrie Sofia MSW, [place] 03/24/2024

## 2024-03-24 NOTE — Progress Notes (Signed)
 Pt has been accepted to [facility] on 03/24/2024  Bed assignment: Main campus  Pt meets inpatient criteria per Sabo Uba, NP   Attending Physician will be Millie Manners, MD  Report can be called to: [phone] (this is a pager, please leave call-back number when giving report)  Pt can arrive after 8 AM  Care Team Notified: Delon Side, RN

## 2024-03-24 NOTE — ED Notes (Signed)
 IVC Complete all paper work uploaded; 3 copies in orange zone, 1 copy in medical records, 1 copy in red folder; Case # documented in chart and IVC folder; Case# [id number] Envelope# [id number]; EXP: 03/31/2024

## 2024-03-24 NOTE — ED Notes (Signed)
 Bilateral ankle restraints removed per PA Francis.

## 2024-03-24 NOTE — ED Notes (Signed)
 IVC accepted & in process; Case# [id number]; Envelope# [id number]; paperwork in orange zone; waiting on findings and custody.
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
## 2024-03-24 NOTE — ED Notes (Signed)
 Wounds rewrapped in triage with xeroform and gauze.

## 2024-03-24 NOTE — ED Notes (Signed)
 Violent restraints removed, pt sleeping comfortably and being calm and cooperative

## 2024-03-24 NOTE — ED Notes (Signed)
 Pt awake, requesting water , breakfast tray provided

## 2024-03-24 NOTE — ED Notes (Signed)
 The patient moved to RM 52. 3 copies of the IVC paperwork moved to the nursing station in the Purple Zone.  RN made aware of documents.

## 2024-03-24 NOTE — ED Provider Notes (Signed)
 " [place] [facility] AT [facility] Provider Note   CSN: [id number] Arrival date & time: 03/24/24  0034     Patient presents with: Laceration   Marie Mckenzie is a 34 y.o. female.  Presents today for lacerations to bilateral hands that occurred at home while the patient was doing something in a window and subsequently ended up breaking.  Patient reports her tetanus was updated 2 months ago.    Laceration      Prior to Admission medications  Medication Sig Start Date End Date Taking? Authorizing Provider  amphetamine -dextroamphetamine  (ADDERALL) 30 MG tablet TAKE 1 TABLET BY MOUTH EVERY MORNING HALF TABLET AT MIDDAY, AND 1 TABLET EVERY AFTERNOON Patient taking differently: Take 30 mg by mouth 2 (two) times daily. 05/22/19   [provider]  cariprazine (VRAYLAR) 3 MG capsule Take 3 mg by mouth daily.    [provider]  escitalopram  (LEXAPRO ) 20 MG tablet Take 20 mg by mouth daily.    [provider]  LORazepam  (ATIVAN ) 1 MG tablet Take 1 mg by mouth 3 (three) times daily as needed for anxiety.    [provider]  mirtazapine (REMERON) 7.5 MG tablet Take 7.5 mg by mouth at bedtime.    [provider]  Vitamin D , Ergocalciferol , (DRISDOL ) 1.25 MG (50000 UNIT) CAPS capsule Take 1 capsule (50,000 Units total) by mouth every 7 (seven) days. 01/20/24   Donzella Lauraine SAILOR, DO    Allergies: Patient has no known allergies.    Review of Systems  Skin:  Positive for wound.    Updated Vital Signs BP 99/67   Pulse (!) 105   Temp 97.8 F (36.6 C) (Oral)   Resp 20   Ht 5' 6 (1.676 m)   Wt 82.6 kg   LMP 03/10/2024   SpO2 94%   BMI 29.39 kg/m   Physical Exam Vitals and nursing note reviewed.  Constitutional:      General: She is not in acute distress.    Appearance: She is well-developed.  HENT:     Head: Normocephalic and atraumatic.     Right Ear: External ear normal.     Left Ear: External ear normal.   Eyes:     Conjunctiva/sclera: Conjunctivae normal.  Cardiovascular:     Rate and Rhythm: Regular rhythm. Tachycardia present.     Pulses: Normal pulses.     Heart sounds: Normal heart sounds.  Pulmonary:     Effort: Pulmonary effort is normal. No respiratory distress.  Abdominal:     Palpations: Abdomen is soft.     Tenderness: There is no abdominal tenderness.  Musculoskeletal:        General: No swelling.     Right hand: Laceration present.     Left hand: Laceration present.     Cervical back: Neck supple.     Comments: Patient with avulsion of the soft tissue of the palmar surface of the left thumb, small superficial lacerations of the right dorsal pinky at the PIP, left palmar 3rd and 4th fingers.  Patient neurovascularly intact with +2 dorsalis pedis pulses bilaterally and able to move hand through full ROM.  Skin:    General: Skin is warm and dry.     Capillary Refill: Capillary refill takes less than 2 seconds.  Neurological:     Mental Status: She is alert.  Psychiatric:        Mood and Affect: Mood is anxious. Affect is angry.  Speech: Speech is rapid and pressured and tangential.        Behavior: Behavior is agitated, aggressive and combative.        Judgment: Judgment is impulsive.     (all labs ordered are listed, but only abnormal results are displayed) Labs Reviewed  BASIC METABOLIC PANEL WITH GFR - Abnormal; Notable for the following components:      Result Value   Potassium 3.1 (*)    CO2 18 (*)    Glucose, Bld 136 (*)    BUN <5 (*)    Anion gap 25 (*)    All other components within normal limits  SALICYLATE LEVEL - Abnormal; Notable for the following components:   Salicylate Lvl <7.0 (*)    All other components within normal limits  ETHANOL - Abnormal; Notable for the following components:   Alcohol, Ethyl (B) 127 (*)    All other components within normal limits  ACETAMINOPHEN  LEVEL - Abnormal; Notable for the following components:    Acetaminophen  (Tylenol ), Serum <10 (*)    All other components within normal limits  CBC WITH DIFFERENTIAL/PLATELET  URINE DRUG SCREEN  URINALYSIS, ROUTINE W REFLEX MICROSCOPIC    EKG: EKG Interpretation Date/Time:  Thursday March 24 2024 03:25:31 EST Ventricular Rate:  122 PR Interval:  130 QRS Duration:  111 QT Interval:  338 QTC Calculation: 449 R Axis:   77  Text Interpretation: Sinus tachycardia Ventricular bigeminy LAE, consider biatrial enlargement Repol abnrm suggests ischemia, anterolateral Baseline wander in lead(s) V5 Confirmed by Jerral Meth [phone]) on 03/24/2024 4:03:24 AM  Radiology: [facility] Hand Complete Right Result Date: 03/24/2024 CLINICAL DATA:  Push through glass window with fifth digit laceration, initial encounter EXAM: RIGHT HAND - COMPLETE 3+ VIEW COMPARISON:  None Available. FINDINGS: No acute fracture or dislocation is noted. Bandaging is noted over the fifth digit. No radiopaque foreign body is seen. IMPRESSION: Soft tissue injury without acute bony abnormality. Electronically Signed   By: Oneil Devonshire M.D.   On: 03/24/2024 02:44   DG Hand Complete Left Result Date: 03/24/2024 CLINICAL DATA:  Push through glass window with first digit laceration, initial encounter EXAM: LEFT HAND - COMPLETE 3+ VIEW COMPARISON:  None Available. FINDINGS: Soft tissue bandaging is noted consistent with the given clinical history. No acute fracture or dislocation is noted. No definitive foreign body is seen. IMPRESSION: Soft tissue injury without acute bony abnormality Electronically Signed   By: Oneil Devonshire M.D.   On: 03/24/2024 02:42     Procedures   Medications Ordered in the ED  potassium chloride  SA (KLOR-CON  M) CR tablet 40 mEq (has no administration in time range)  LORazepam  (ATIVAN ) tablet 1 mg (1 mg Oral Given 03/24/24 0219)  ziprasidone  (GEODON ) injection 20 mg (20 mg Intramuscular Given 03/24/24 0319)  sterile water  (preservative free) injection (  Given 03/24/24  0322)  ondansetron  (ZOFRAN ) injection 4 mg (4 mg Intravenous Given 03/24/24 0433)  sodium chloride  0.9 % bolus 1,000 mL (0 mLs Intravenous Stopped 03/24/24 0604)    Clinical Course as of 03/24/24 0617  Thu Mar 24, 2024  0312 Marie Mckenzie is a 34 y.o. with a current psychiatric diagnosis of MDD, ADHD, and AUD who had recent psychiatric admission on 01/27/24 secondary to SI in the context of alcohol use. Tonight, patient is presenting with an incongruent story.  She stating she was trying to open a window with a curtain rod yet has substantial lacerations all over her arms. She appears to be either inebriated or  intoxicated.  She is completely inconsolable, tangential in nature, requires frequent redirection.  At 1 point, despite having multiple lacerations on her hands, she refuses stay in her room and started running through the emergency department shaking her and splashing blood all over the room. Patient could not be redirected.  She appears to have no insight into her condition.  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  Patient has no insight at this time. Suspect these lacerations may be self-induced given her recent admission for suicidality in the setting of substance use  [CC]  0336 Patient actively vomiting, zofran  ordered [KK]    Clinical Course User Index [CC] Jerral Meth, MD [KK] Francis Ileana SAILOR, PA-C                                 Medical Decision Making Amount and/or Complexity of Data Reviewed Labs: ordered. Radiology: ordered.  Risk Prescription drug management.   This patient presents to the ED for concern of hand lacerations differential diagnosis includes lacerations, open fracture, avulsions  Labs ordered: CBC unremarkable, mild hypokalemia at 3.1, reduced CO2 at 18, bun less than 5, anion gap 25, Tylenol  and salicylate level negative, alcohol 127  Imaging Studies ordered:  I ordered imaging studies including bilateral hand  x-rays I independently visualized and interpreted imaging which showed soft tissue injury without acute bony abnormality. I agree with the radiologist interpretation EKG: Sinus tachycardia, ventricular bigeminy   Medicines ordered and prescription drug management:  I ordered medication including Ativan , Geodon , IVF    I have reviewed the patients home medicines and have made adjustments as needed   Problem List / ED Course:  Patient is presenting with an incongruent story.  She stating she was trying to open a window with a curtain rod yet has substantial lacerations all over her arms. She appears to be either inebriated or intoxicated.  She is completely inconsolable, tangential in nature, requires frequent redirection.  At 1 point, despite having multiple lacerations on her hands, she refuses stay in her room and started running through the emergency department shaking her hands and splashing blood all over the room.  Once patient was back in room she began attempting to attack staff.  IVC placed, Geodon  ordered, and 4 point restraints ordered. Patient cleared medically, patient to be evaluated by TTS who will determine patient disposition.       Final diagnoses:  None    ED Discharge Orders     None          Francis Ileana SAILOR DEVONNA 03/24/24 [id number]    Jerral Meth, MD 03/24/24 [phone]  "

## 2024-03-24 NOTE — ED Notes (Signed)
 [place] [facility] (Lives with the patient) [phone] is requesting an update on the patient including whether the patient will be moved to another facility.

## 2024-03-24 NOTE — ED Provider Triage Note (Signed)
 Emergency Medicine Provider Triage Evaluation Note  Neenah Canter , a 34 y.o. female  was evaluated in triage.  Pt complains of Lacerations to bilateral hands.  Patient states she was trying to put up a curtain rod and excellently pushed through a window.  Her left hand has lacerations to the thumb, index, long finger, and ring finger on the palmar side.  Her right hand has a laceration on the posterior little finger.  Bleeding controlled.  Brisk cap refill, movement, sensation intact  Review of Systems  Positive:  Negative:   Physical Exam  BP (!) 134/114 (BP Location: Right Arm)   Pulse (!) 103   Temp 97.7 F (36.5 C) (Oral)   Resp 18   Ht 5' 6 (1.676 m)   Wt 82.6 kg   LMP 03/10/2024   SpO2 100%   BMI 29.39 kg/m  Gen:   Awake, no distress   Resp:  Normal effort  MSK:   Moves extremities without difficulty  Other:    Medical Decision Making  Medically screening exam initiated at 1:21 AM.  Appropriate orders placed.  Edsel Charlies Molt was informed that the remainder of the evaluation will be completed by another provider, this initial triage assessment does not replace that evaluation, and the importance of remaining in the ED until their evaluation is complete.     Logan Ubaldo NOVAK, PA-C 03/24/24 0124

## 2024-03-25 MED ORDER — ACETAMINOPHEN 500 MG PO TABS
1000.0000 mg | ORAL_TABLET | Freq: Once | ORAL | Status: AC
  Administered 2024-03-25: 1000 mg via ORAL
  Filled 2024-03-25: qty 2

## 2024-03-25 NOTE — ED Provider Notes (Signed)
 Emergency Medicine Observation Re-evaluation Note  Marie Mckenzie is a 34 y.o. female, seen on rounds today.  Pt initially presented to the ED for complaints of Laceration Currently, the patient is awake and alert sitting upright, interacting appropriately, complaining of pain in her hands.  Physical Exam  BP 114/76   Pulse 97   Temp 98.1 F (36.7 C)   Resp 20   Ht 1.676 m (5' 6)   Wt 82.6 kg   LMP 03/10/2024   SpO2 99%   BMI 29.39 kg/m  Physical Exam General: Adult female awake and alert no distress Cardiac: Regular rate and rhythm, borderline tachycardia Lungs: No increased work of breathing Psych: Pleasantly interactive  ED Course / MDM  EKG:EKG Interpretation Date/Time:  Thursday March 24 2024 03:25:31 EST Ventricular Rate:  122 PR Interval:  130 QRS Duration:  111 QT Interval:  338 QTC Calculation: 449 R Axis:   77  Text Interpretation: Sinus tachycardia Ventricular bigeminy LAE, consider biatrial enlargement Repol abnrm suggests ischemia, anterolateral Baseline wander in lead(s) V5 Confirmed by Jerral Meth [phone]) on 03/24/2024 4:03:24 AM  I have reviewed the labs performed to date as well as medications administered while in observation.  Recent changes in the last 24 hours include evaluation by behavioral health, suture repair.  Plan  Current plan is for behavioral health placement later today.    Garrick Charleston, MD 03/25/24 [phone]

## 2024-03-25 NOTE — ED Notes (Signed)
 I called sheriff office for pt be transfer to [facility] and transfer call zachary.

## 2024-03-25 NOTE — ED Notes (Signed)
 All patient belonging turn over to sheriff department for transport with patient to [facility]

## 2024-04-01 ENCOUNTER — Ambulatory Visit: Payer: MEDICAID
